# Patient Record
Sex: Female | Born: 1935 | Race: White | Hispanic: No | Smoking: Never smoker
Health system: Southern US, Community
[De-identification: ages and names within clinical notes are randomized; demographics above are authoritative.]

## PROBLEM LIST (undated history)

## (undated) DIAGNOSIS — E669 Obesity, unspecified: Secondary | ICD-10-CM

## (undated) DIAGNOSIS — E119 Type 2 diabetes mellitus without complications: Secondary | ICD-10-CM

## (undated) DIAGNOSIS — M109 Gout, unspecified: Secondary | ICD-10-CM

## (undated) DIAGNOSIS — K219 Gastro-esophageal reflux disease without esophagitis: Secondary | ICD-10-CM

## (undated) DIAGNOSIS — I272 Pulmonary hypertension, unspecified: Secondary | ICD-10-CM

## (undated) DIAGNOSIS — E785 Hyperlipidemia, unspecified: Secondary | ICD-10-CM

## (undated) DIAGNOSIS — T7840XA Allergy, unspecified, initial encounter: Secondary | ICD-10-CM

## (undated) DIAGNOSIS — M1990 Unspecified osteoarthritis, unspecified site: Secondary | ICD-10-CM

## (undated) DIAGNOSIS — E559 Vitamin D deficiency, unspecified: Secondary | ICD-10-CM

## (undated) DIAGNOSIS — N183 Chronic kidney disease, stage 3 unspecified: Secondary | ICD-10-CM

## (undated) DIAGNOSIS — I10 Essential (primary) hypertension: Secondary | ICD-10-CM

## (undated) DIAGNOSIS — I6529 Occlusion and stenosis of unspecified carotid artery: Secondary | ICD-10-CM

## (undated) DIAGNOSIS — F419 Anxiety disorder, unspecified: Secondary | ICD-10-CM

## (undated) DIAGNOSIS — I5032 Chronic diastolic (congestive) heart failure: Secondary | ICD-10-CM

## (undated) DIAGNOSIS — I480 Paroxysmal atrial fibrillation: Secondary | ICD-10-CM

## (undated) HISTORY — DX: Pulmonary hypertension, unspecified: I27.20

## (undated) HISTORY — DX: Allergy, unspecified, initial encounter: T78.40XA

## (undated) HISTORY — DX: Chronic diastolic (congestive) heart failure: I50.32

## (undated) HISTORY — PX: VEIN SURGERY: SHX48

## (undated) HISTORY — DX: Obesity, unspecified: E66.9

## (undated) HISTORY — DX: Occlusion and stenosis of unspecified carotid artery: I65.29

## (undated) HISTORY — DX: Essential (primary) hypertension: I10

## (undated) HISTORY — PX: CATARACT EXTRACTION: SUR2

## (undated) HISTORY — DX: Vitamin D deficiency, unspecified: E55.9

## (undated) HISTORY — PX: OTHER SURGICAL HISTORY: SHX169

## (undated) HISTORY — DX: Paroxysmal atrial fibrillation: I48.0

## (undated) HISTORY — DX: Gout, unspecified: M10.9

## (undated) HISTORY — PX: JOINT REPLACEMENT: SHX530

## (undated) HISTORY — DX: Hyperlipidemia, unspecified: E78.5

## (undated) HISTORY — DX: Unspecified osteoarthritis, unspecified site: M19.90

## (undated) HISTORY — DX: Type 2 diabetes mellitus without complications: E11.9

---

## 2008-02-16 ENCOUNTER — Ambulatory Visit: Payer: Self-pay | Admitting: Vascular Surgery

## 2008-05-31 ENCOUNTER — Ambulatory Visit: Payer: Self-pay | Admitting: Vascular Surgery

## 2008-08-05 ENCOUNTER — Inpatient Hospital Stay (HOSPITAL_COMMUNITY): Admission: RE | Admit: 2008-08-05 | Discharge: 2008-08-09 | Payer: Self-pay | Admitting: Orthopedic Surgery

## 2009-02-10 ENCOUNTER — Ambulatory Visit (HOSPITAL_COMMUNITY): Admission: RE | Admit: 2009-02-10 | Discharge: 2009-02-10 | Payer: Self-pay | Admitting: Internal Medicine

## 2009-03-31 ENCOUNTER — Inpatient Hospital Stay (HOSPITAL_COMMUNITY): Admission: RE | Admit: 2009-03-31 | Discharge: 2009-04-03 | Payer: Self-pay | Admitting: Orthopedic Surgery

## 2010-03-30 ENCOUNTER — Emergency Department (HOSPITAL_COMMUNITY)
Admission: EM | Admit: 2010-03-30 | Discharge: 2010-03-30 | Disposition: A | Discharge status: Home / Self Care | Payer: Medicare Other | Attending: Emergency Medicine | Admitting: Emergency Medicine

## 2010-03-30 DIAGNOSIS — M109 Gout, unspecified: Secondary | ICD-10-CM | POA: Insufficient documentation

## 2010-03-30 DIAGNOSIS — I10 Essential (primary) hypertension: Secondary | ICD-10-CM | POA: Insufficient documentation

## 2010-03-30 DIAGNOSIS — K219 Gastro-esophageal reflux disease without esophagitis: Secondary | ICD-10-CM | POA: Insufficient documentation

## 2010-03-30 DIAGNOSIS — R1110 Vomiting, unspecified: Secondary | ICD-10-CM | POA: Insufficient documentation

## 2010-03-30 DIAGNOSIS — K5289 Other specified noninfective gastroenteritis and colitis: Secondary | ICD-10-CM | POA: Insufficient documentation

## 2010-03-30 DIAGNOSIS — E119 Type 2 diabetes mellitus without complications: Secondary | ICD-10-CM | POA: Insufficient documentation

## 2010-03-30 DIAGNOSIS — R197 Diarrhea, unspecified: Secondary | ICD-10-CM | POA: Insufficient documentation

## 2010-03-30 LAB — URINALYSIS, ROUTINE W REFLEX MICROSCOPIC
Bilirubin Urine: NEGATIVE
Hgb urine dipstick: NEGATIVE
Nitrite: POSITIVE — AB
Specific Gravity, Urine: 1.018 (ref 1.005–1.030)
Urobilinogen, UA: 0.2 mg/dL (ref 0.0–1.0)
pH: 6.5 (ref 5.0–8.0)

## 2010-03-30 LAB — DIFFERENTIAL
Basophils Absolute: 0 10*3/uL (ref 0.0–0.1)
Basophils Relative: 0 % (ref 0–1)
Eosinophils Absolute: 0 10*3/uL (ref 0.0–0.7)
Monocytes Absolute: 0.3 10*3/uL (ref 0.1–1.0)
Monocytes Relative: 5 % (ref 3–12)
Neutrophils Relative %: 91 % — ABNORMAL HIGH (ref 43–77)

## 2010-03-30 LAB — BASIC METABOLIC PANEL
CO2: 24 mEq/L (ref 19–32)
Calcium: 9.1 mg/dL (ref 8.4–10.5)
Chloride: 106 mEq/L (ref 96–112)
Glucose, Bld: 178 mg/dL — ABNORMAL HIGH (ref 70–99)
Potassium: 4.2 mEq/L (ref 3.5–5.1)
Sodium: 138 mEq/L (ref 135–145)

## 2010-03-30 LAB — URINE MICROSCOPIC-ADD ON

## 2010-03-30 LAB — CBC
MCH: 31.6 pg (ref 26.0–34.0)
Platelets: 145 10*3/uL — ABNORMAL LOW (ref 150–400)
RBC: 4.18 MIL/uL (ref 3.87–5.11)
RDW: 13.6 % (ref 11.5–15.5)

## 2010-04-01 LAB — URINE CULTURE
Colony Count: >=100000
Culture  Setup Time: 201202250140

## 2010-04-10 ENCOUNTER — Other Ambulatory Visit (HOSPITAL_COMMUNITY): Payer: Self-pay | Admitting: Internal Medicine

## 2010-04-10 DIAGNOSIS — Z1231 Encounter for screening mammogram for malignant neoplasm of breast: Secondary | ICD-10-CM

## 2010-04-19 ENCOUNTER — Ambulatory Visit (HOSPITAL_COMMUNITY)
Admission: RE | Admit: 2010-04-19 | Discharge: 2010-04-19 | Disposition: A | Discharge status: Home / Self Care | Payer: Medicare Other | Source: Ambulatory Visit | Attending: Internal Medicine | Admitting: Internal Medicine

## 2010-04-19 DIAGNOSIS — Z1231 Encounter for screening mammogram for malignant neoplasm of breast: Secondary | ICD-10-CM | POA: Insufficient documentation

## 2010-04-19 LAB — HM MAMMOGRAPHY: HM Mammogram: NORMAL

## 2010-04-25 LAB — BASIC METABOLIC PANEL
BUN: 45 mg/dL — ABNORMAL HIGH (ref 6–23)
CO2: 25 mEq/L (ref 19–32)
CO2: 27 mEq/L (ref 19–32)
Chloride: 103 mEq/L (ref 96–112)
Creatinine, Ser: 1.76 mg/dL — ABNORMAL HIGH (ref 0.4–1.2)
GFR calc Af Amer: 34 mL/min — ABNORMAL LOW (ref >60)
GFR calc Af Amer: 43 mL/min — ABNORMAL LOW (ref >60)
GFR calc non Af Amer: 28 mL/min — ABNORMAL LOW (ref >60)
GFR calc non Af Amer: 29 mL/min — ABNORMAL LOW (ref >60)
Glucose, Bld: 150 mg/dL — ABNORMAL HIGH (ref 70–99)
Potassium: 4.3 mEq/L (ref 3.5–5.1)
Potassium: 5.4 mEq/L — ABNORMAL HIGH (ref 3.5–5.1)
Sodium: 134 mEq/L — ABNORMAL LOW (ref 135–145)
Sodium: 135 mEq/L (ref 135–145)

## 2010-04-25 LAB — PROTIME-INR
INR: 1.07 (ref 0.00–1.49)
INR: 1.2 (ref 0.00–1.49)
INR: 1.79 — ABNORMAL HIGH (ref 0.00–1.49)
INR: 2.96 — ABNORMAL HIGH (ref 0.00–1.49)
Prothrombin Time: 13.8 seconds (ref 11.6–15.2)
Prothrombin Time: 20.6 seconds — ABNORMAL HIGH (ref 11.6–15.2)

## 2010-04-25 LAB — CBC
HCT: 25.4 % — ABNORMAL LOW (ref 36.0–46.0)
Hemoglobin: 8.1 g/dL — ABNORMAL LOW (ref 12.0–15.0)
Hemoglobin: 8.7 g/dL — ABNORMAL LOW (ref 12.0–15.0)
MCHC: 34.2 g/dL (ref 30.0–36.0)
MCHC: 34.3 g/dL (ref 30.0–36.0)
MCV: 95.4 fL (ref 78.0–100.0)
Platelets: 118 10*3/uL — ABNORMAL LOW (ref 150–400)
RBC: 2.48 MIL/uL — ABNORMAL LOW (ref 3.87–5.11)
RBC: 2.68 MIL/uL — ABNORMAL LOW (ref 3.87–5.11)
RDW: 14.8 % (ref 11.5–15.5)
RDW: 15 % (ref 11.5–15.5)
WBC: 7.6 10*3/uL (ref 4.0–10.5)
WBC: 7.6 10*3/uL (ref 4.0–10.5)

## 2010-04-25 LAB — GLUCOSE, CAPILLARY
Glucose-Capillary: 115 mg/dL — ABNORMAL HIGH (ref 70–99)
Glucose-Capillary: 142 mg/dL — ABNORMAL HIGH (ref 70–99)
Glucose-Capillary: 175 mg/dL — ABNORMAL HIGH (ref 70–99)

## 2010-04-25 LAB — TYPE AND SCREEN

## 2010-05-13 LAB — GLUCOSE, CAPILLARY
Glucose-Capillary: 104 mg/dL — ABNORMAL HIGH (ref 70–99)
Glucose-Capillary: 124 mg/dL — ABNORMAL HIGH (ref 70–99)
Glucose-Capillary: 129 mg/dL — ABNORMAL HIGH (ref 70–99)
Glucose-Capillary: 131 mg/dL — ABNORMAL HIGH (ref 70–99)
Glucose-Capillary: 132 mg/dL — ABNORMAL HIGH (ref 70–99)
Glucose-Capillary: 139 mg/dL — ABNORMAL HIGH (ref 70–99)
Glucose-Capillary: 139 mg/dL — ABNORMAL HIGH (ref 70–99)
Glucose-Capillary: 150 mg/dL — ABNORMAL HIGH (ref 70–99)
Glucose-Capillary: 158 mg/dL — ABNORMAL HIGH (ref 70–99)
Glucose-Capillary: 159 mg/dL — ABNORMAL HIGH (ref 70–99)
Glucose-Capillary: 161 mg/dL — ABNORMAL HIGH (ref 70–99)
Glucose-Capillary: 168 mg/dL — ABNORMAL HIGH (ref 70–99)
Glucose-Capillary: 176 mg/dL — ABNORMAL HIGH (ref 70–99)
Glucose-Capillary: 177 mg/dL — ABNORMAL HIGH (ref 70–99)
Glucose-Capillary: 179 mg/dL — ABNORMAL HIGH (ref 70–99)

## 2010-05-13 LAB — PROTIME-INR
INR: 1.2 (ref 0.00–1.49)
INR: 2.1 — ABNORMAL HIGH (ref 0.00–1.49)
INR: 2.2 — ABNORMAL HIGH (ref 0.00–1.49)
INR: 2.4 — ABNORMAL HIGH (ref 0.00–1.49)
Prothrombin Time: 15.9 seconds — ABNORMAL HIGH (ref 11.6–15.2)

## 2010-05-13 LAB — CBC
HCT: 22.5 % — ABNORMAL LOW (ref 36.0–46.0)
HCT: 23.6 % — ABNORMAL LOW (ref 36.0–46.0)
HCT: 24.4 % — ABNORMAL LOW (ref 36.0–46.0)
HCT: 27.2 % — ABNORMAL LOW (ref 36.0–46.0)
Hemoglobin: 8.1 g/dL — ABNORMAL LOW (ref 12.0–15.0)
Hemoglobin: 8.4 g/dL — ABNORMAL LOW (ref 12.0–15.0)
MCHC: 34.1 g/dL (ref 30.0–36.0)
MCHC: 34.5 g/dL (ref 30.0–36.0)
MCV: 93.9 fL (ref 78.0–100.0)
Platelets: 137 10*3/uL — ABNORMAL LOW (ref 150–400)
RBC: 2.4 MIL/uL — ABNORMAL LOW (ref 3.87–5.11)
RBC: 2.5 MIL/uL — ABNORMAL LOW (ref 3.87–5.11)
RDW: 15.6 % — ABNORMAL HIGH (ref 11.5–15.5)
RDW: 16.1 % — ABNORMAL HIGH (ref 11.5–15.5)
RDW: 16.3 % — ABNORMAL HIGH (ref 11.5–15.5)
WBC: 7.3 10*3/uL (ref 4.0–10.5)
WBC: 7.6 10*3/uL (ref 4.0–10.5)
WBC: 9.5 10*3/uL (ref 4.0–10.5)

## 2010-05-13 LAB — BASIC METABOLIC PANEL
BUN: 33 mg/dL — ABNORMAL HIGH (ref 6–23)
Calcium: 8.5 mg/dL (ref 8.4–10.5)
Calcium: 8.5 mg/dL (ref 8.4–10.5)
Creatinine, Ser: 1.52 mg/dL — ABNORMAL HIGH (ref 0.4–1.2)
GFR calc Af Amer: 42 mL/min — ABNORMAL LOW (ref >60)
GFR calc non Af Amer: 33 mL/min — ABNORMAL LOW (ref >60)
GFR calc non Af Amer: 35 mL/min — ABNORMAL LOW (ref >60)
Glucose, Bld: 139 mg/dL — ABNORMAL HIGH (ref 70–99)
Glucose, Bld: 169 mg/dL — ABNORMAL HIGH (ref 70–99)
Potassium: 4.3 mEq/L (ref 3.5–5.1)
Potassium: 4.6 mEq/L (ref 3.5–5.1)
Sodium: 131 mEq/L — ABNORMAL LOW (ref 135–145)

## 2010-05-14 LAB — TYPE AND SCREEN: ABO/RH(D): O NEG

## 2010-05-14 LAB — URINALYSIS, ROUTINE W REFLEX MICROSCOPIC
Bilirubin Urine: NEGATIVE
Glucose, UA: NEGATIVE mg/dL
Hgb urine dipstick: NEGATIVE
Protein, ur: NEGATIVE mg/dL
Specific Gravity, Urine: 1.014 (ref 1.005–1.030)

## 2010-05-14 LAB — COMPREHENSIVE METABOLIC PANEL
ALT: 18 U/L (ref 0–35)
AST: 26 U/L (ref 0–37)
Alkaline Phosphatase: 100 U/L (ref 39–117)
CO2: 25 mEq/L (ref 19–32)
Calcium: 9.8 mg/dL (ref 8.4–10.5)
Chloride: 106 mEq/L (ref 96–112)
GFR calc Af Amer: 42 mL/min — ABNORMAL LOW (ref >60)
GFR calc non Af Amer: 35 mL/min — ABNORMAL LOW (ref >60)
Glucose, Bld: 105 mg/dL — ABNORMAL HIGH (ref 70–99)
Potassium: 4.6 mEq/L (ref 3.5–5.1)
Sodium: 139 mEq/L (ref 135–145)

## 2010-05-14 LAB — URINE MICROSCOPIC-ADD ON

## 2010-05-14 LAB — DIFFERENTIAL
Basophils Relative: 1 % (ref 0–1)
Eosinophils Absolute: 0.2 10*3/uL (ref 0.0–0.7)
Eosinophils Relative: 2 % (ref 0–5)
Lymphs Abs: 2.1 10*3/uL (ref 0.7–4.0)

## 2010-05-14 LAB — PROTIME-INR: Prothrombin Time: 14 seconds (ref 11.6–15.2)

## 2010-05-14 LAB — CBC
Hemoglobin: 12.6 g/dL (ref 12.0–15.0)
RBC: 3.94 MIL/uL (ref 3.87–5.11)
WBC: 6.5 10*3/uL (ref 4.0–10.5)

## 2010-06-19 NOTE — Consult Note (Signed)
VASCULAR SURGERY CONSULTATION   [name], [name]  DOB:  August 13, 1930                                       02/16/2008  BMWUX#:[id number]   I saw the patient in the office today in consultation concerning her  bilateral lower extremity cellulitis and leg swelling.  This is a  pleasant 77 year old woman who states that she has had cellulitis of  both lower extremities off and on for 2 years.  She has been treated  with doxycycline recently and has had good results from this.  She is  followed by Dr. Concepcion [name].  She has not had any ulcers that she is aware  of.  She has tried compression stockings off and on, however, when the  cellulitis is at its worst she has a hard time tolerating the stockings.  The daughter was also concerned because her feet were turning purple.  She is unaware of any previous history of DVT or phlebitis.  I did not  get any history of claudication, rest pain or nonhealing ulcers.  She  does tell me that she had veins stripped from both legs in 1967.   PAST MEDICAL HISTORY:  Significant for adult onset diabetes and  hypertension.  In addition, she has mild chronic renal insufficiency.  She also has arthritis of both knees and is being considered for knee  replacement by Dr. Luiz [name] once her cellulitis has resolved.  In  addition, she has gastroesophageal reflux disease.  She denies any  history of hypercholesterolemia, history of previous myocardial  infarction, history of congestive heart failure or history of COPD.   FAMILY HISTORY:  Mother died with a stroke at age 75.  Father died from  a heart attack at age 87.  She has a brother with heart disease who died  at age 65.  She has a sister with high blood pressure.   SOCIAL HISTORY:  She is widowed.  She has four children.  She does not  use tobacco.   REVIEW OF SYSTEMS AND MEDICATIONS:  Are documented on the medical  history form in her chart.   PHYSICAL EXAMINATION:  General:  This is a  pleasant 77 year old woman  who appears her stated age.  Vital signs:  Blood pressure is 154/64,  heart rate is 55.  HEENT:  Unremarkable.  Neck:  Supple.  There is no  cervical lymphadenopathy.  I do not detect any carotid bruits.  Lungs:  Clear bilaterally to auscultation.  Cardiac:  She has a regular rate and  rhythm.  Abdomen:  Soft and nontender.  I cannot palpate an aneurysm.  I  do not palpate any masses.  She has normal pitched bowel sounds.  She  has palpable femoral, popliteal and dorsalis pedis pulses bilaterally.  I cannot palpate posterior tibial pulses.  However, both feet are warm  and well-perfused without ischemic ulcers.  She has significant  varicosities in her thighs and around her knees bilaterally.  She has  hyperpigmentation bilaterally.  Currently there are no venous ulcers and  no ischemic ulcers on her feet.  Neurologic:  Exam is nonfocal.   She did have noninvasive studies done by [facility]  which showed normal ABIs bilaterally and no evidence of arterial  insufficiency.  Venous duplex study showed no evidence of thrombus or  thrombophlebitis.  It did not look like  a reflux study was done.   Based on exam she has evidence of chronic venous insufficiency and her  cellulitis appears to be responding nicely to antibiotics.  I have  explained that typically the cellulitis associated with chronic venous  insufficiency can take 6 weeks or longer to treat given the venous  stasis.  We have discussed the importance of intermittent leg elevation  and the proper position for this and also the use of compression  stockings.  She just bought knee high compression stockings with a 20-30  mmHg gradient and she will begin to use these.  I reassured her that she  had excellent arterial flow and that this did not appear to be an issue.  She would like to be seen back in 3 months and at that point we can  arrange for a formal venous reflux study.  At that  point I will have her  follow up with Dr. Hart [name] in case the reflux study suggests that she has  a situation which could potentially be improved with venous ablation.   [name] [name]. [name] [name], M.D.  Electronically Signed  CSD/MEDQ  D:  02/16/2008  T:  02/17/2008  Job:  [id number]   cc:   [name] [name], M.D.  [name] [name], M.D.

## 2010-06-19 NOTE — Op Note (Signed)
[name] [name], [name] [name]                ACCOUNT NO.:  [id number]   MEDICAL RECORD NO.:  [id number]          PATIENT TYPE:  INP   LOCATION:  5010                         FACILITY:  [facility]   PHYSICIAN:  Harvie Junior, M.D.   DATE OF BIRTH:  01/15/1931   DATE OF PROCEDURE:  08/05/2008  DATE OF DISCHARGE:                               OPERATIVE REPORT   PREOPERATIVE DIAGNOSIS:  End-stage degenerative joint disease, left  knee.   POSTOPERATIVE DIAGNOSIS:  End-stage degenerative joint disease, left  knee.   PROCEDURE:  1. Left total knee replacement with Sigma system size 5 femur, size 4      tibia, 10-mm bridging bearing, 38-mm all poly patella.  2. Computer-assisted left total knee replacement.   SURGEON:  Harvie Junior, MD   ASSISTANT:  Marshia Ly, PA   ANESTHESIA:  General.   BRIEF HISTORY:  [name] [name] is a 77 year old female with a long history  of having had severe pain in the both knees for which she presumably was  also treated conservatively for a period of time.  Because of failure of  all conservative care, she was taken to the operating room for a left  total knee replacement because of her significant valgus malalignment.  We felt that computer-assisted total knee replacement is appropriate  course of action.  She was brought to the operating room for this  procedure.   PROCEDURE:  The patient was brought to the operating room.  After  adequate anesthesia was obtained with general anesthetic, the patient  was placed supine on the operating room.  The left leg was prepped and  draped in the sterile fashion.  Following this, the leg was  exsanguinated with blood pressure tourniquet to 350 mmHg.  Following  this, a midline incision was made.  Subcutaneous tissue was dissected  down the level of the extensor mechanism.  A medial parapatellar  arthrotomy was undertaken followed by removal of medial and lateral  meniscus and the anterior and posterior cruciates in the  retropatellar  fat pad.  Once this was taken, the computer assistance modules were  placed, 2 pins in the tibia, 2 pins in the femur, and a registration  process was undertaken, this added half an hour to the surgical  procedure.  Once the registration process was undertaken, the tibia was  cut perpendicular to the long axis care being taken to make a very  minimal cut because of valgus malalignment of the knee.  Attention was  then turned to the distal femur where a 10-mm distal femoral cut was  made perpendicular to the anatomic axis.  The tibial cut was made  perpendicular to the tibia with computer assistance.  Once this was  done, spacer blocks were put in place, a little bit tightest on the  lateral side due to releasing on the lateral side of the IT band  distally in the capsular restraints and the alignment was then perfect  neutral long alignment under computer assistance with good gap balance.  At this point, tension was turned to the femur, sized to a 5.  I checked  that just because of a female, but it did in fact look like 5 in both  medial and lateral and anterior-posterior planes, made the anterior-  posterior cuts and the chamfer cuts and the box cut.  Attention was  turned to the tibia, was sized to a 4, I put it in place, drilled and  keeled the tibia and put a trial tibia and size 5 femur and drilled the  lugs for the femur and then put a 10 bearing in perfect neutral long  alignment with computer assistance and perfect gap balance, turned to  the patellar, cut it down to a level 14, put a 38-mm paddle on and  drilled the lugs for this, put the trial patella in the range of motion,  no tendency towards lateral tracking of the patella, perfectly midline  and excellent range of motion and stability of the knee.  Once this was  completed, attention was turned towards the removal of trial components.  The knee was copiously and thoroughly lavaged, irrigated, suctioned dry,   and dried and then final components were cemented in place, size 4  tibia, size 5 femur, 10-mm bridging bearing trial was placed at 38 all  poly patella with a clamp.  We then removed all excess bone cement.  I  checked the computer alignment one more time, perfect neutral long  alignment and gap balance in both flexion/extension.  The computer was  removed and the cement was allowed to harden.  At this point once the  cement was hardened, the tourniquet was let down, all excess bone cement  was removed with tools, and bleeding was controlled with electrocautery.  The knee was copiously and thoroughly lavaged and the final 10 poly was  put in place.  Medium Hemovac drain and the medial parapatellar  arthrotomy was closed with 1 Vicryl running, skin with 0 and 2-0 Vicryl  and skin staples.  Sterile compressive dressing was applied as well as a  knee immobilizer.  The patient was taken to the recovery room and noted  to be in satisfactory condition.  Estimated blood loss for the procedure  was less than 100 mL.      Harvie Junior, M.D.  Electronically Signed     JLG/MEDQ  D:  08/05/2008  T:  08/06/2008  Job:  [id number]

## 2010-06-19 NOTE — Discharge Summary (Signed)
NAMEAZARRIA, BALINT                ACCOUNT NO.:  [id number]   MEDICAL RECORD NO.:  [id number]          PATIENT TYPE:  INP   LOCATION:  5010                         FACILITY:  [facility]   PHYSICIAN:  Harvie Junior, M.D.   DATE OF BIRTH:  Dec 25, 1930   DATE OF ADMISSION:  08/05/2008  DATE OF DISCHARGE:  08/09/2008                               DISCHARGE SUMMARY   ADMITTING DIAGNOSES:  1. End-stage degenerative joint disease, left knee.  2. Hypertension.  3. Diet-controlled diabetes mellitus.  4. Gouty arthritis.  5. Chronic renal insufficiency.   DISCHARGE DIAGNOSES:  1. End-stage degenerative joint disease, left knee.  2. Hypertension.  3. Diet-controlled diabetes mellitus.  4. Gouty arthritis.  5. Chronic renal insufficiency.  6. Acute blood loss anemia.   PROCEDURES IN HOSPITAL:  Left total knee arthroplasty, computer-  assisted, Jodi Geralds' disease, M.D., August 05, 2008.   BRIEF HISTORY:  Ms. Folden is a pleasant 77 year old female who recently  moved here from [place], [place].  She lives at [facility]  on [facility], which is an assisted living facility.  She  complained of pain in her left knee associated with weightbearing and  also pain at rest.  Her weightbearing x-rays of the left knee showed  that she has bone-on-bone degenerative arthritis.  She got only  temporary relief with conservative treatment including injection  therapy, modification of her activity and medication.  Based upon her  clinical and radiographic findings, she was felt to be a candidate for a  left total knee replacement and is admitted for this.   PERTINENT LABORATORY STUDIES:  Chest x-ray on admission on August 02, 2008  showed no acute cardiopulmonary disease.  EKG on admission showed sinus  bradycardia with premature atrial complexes, left axis deviation,  moderate voltage criteria for LVH, may be normal variant.  Her  hemoglobin on admission was 12.6.  On postoperative day #1,  hemoglobin  was 9.2 with hematocrit of 27.2.  On postoperative day #2, her  hemoglobin was 8.4.  On postoperative day #3, August 08, 2008, it was 8.1  with hematocrit of 23.6.  A stat CBC was ordered on the morning of the  discharge.  Her BMET on admission was within normal limits other than  elevated BUN at 40 and creatinine of 1.45.  This remained stable through  the hospitalization, and on August 07, 2008, her BUN was 31 with a  creatinine of 1.45.  Her blood sugars ranged from 105 to 139.  Her pro  time on admission was 14.0 seconds with an INR of 1.1 and a PTT of 34.  On the day of discharge on Coumadin therapy, her pro time was 24.7  seconds with an INR of 2.1.  CBGs were followed through the  hospitalization and ranged from 104 to 151 on the morning of discharge,  August 09, 2008.   HOSPITAL COURSE:  The patient underwent a left total knee arthroplasty,  computer-assisted, that is well described in Dr. Luiz Blare' operative note  on August 05, 2008.  Preoperatively, she was given a gram of Ancef and  gentamicin 80 mg IV.  Postoperatively, she was given a gram of Ancef  q.8h. x24 hours.  PCA morphine pump was used for pain control, and IV  fluids were instituted.  A Foley catheter was placed at the time of  surgery.  Physical therapy was ordered for walker ambulation and  weightbearing as tolerated on the left.  On postoperative day #1, she  without complaints.  She was taking fluids without difficulty.  Her  vital signs were stable and she was afebrile.  Her hemoglobin was 9.2.  Her BMET showed an elevated BUN and creatinine, but this was stable  compared with preop.  IV fluids were continued.  Her INR was 1.2.  She  was gotten out of bed with physical therapy, and Coumadin was started  for DVT prophylaxis.  On postoperative day #2, she had no complaints.  She did have some nausea and vomiting after taking oral iron, so the  iron was discontinued.  Her Foley catheter was intact.  Her vital  signs  were stable.  Her O2 saturations were 100% on 2 liters oxygen.  She did  desaturate down to the 70s occasionally at night without oxygen, so she  did fine with the oxygen.  She does have a significant family history of  sleep apnea apparently.  Her hemoglobin on this study was 8.4, INR was  2.2.  Her dressing was changed and her Hemovac drain was pulled.  Her IV  was converted to a saline lock.  Her Foley catheter was discontinued, as  well.  Her O2 saturations occasionally would drop to 79% while asleep,  and she was awaken herself and bring her O2 saturations back up into the  mid 90s.  Oxygen by nasal cannula was instituted.  On postoperative day  #3, her hemoglobin was 8.1, INR was 2.4.  The patient denied any  dizziness or shortness of breath with getting up.  On the morning of  discharge, August 09, 2008, she was feeling okay.  We ordered a stat CBC.  She had no dizziness or other complaints.  She was taking fluids and  voiding without difficulty and progressing with physical therapy.  Her  left knee dressing was clean and dry.  Her calf was soft and nontender.  Her INR was 2.1.  She was discharged to a skilled nursing facility.  We  will check CBC prior to discharge.   CONDITION ON DISCHARGE:  Improved.   DISCHARGE DIET:  Regular.   ACTIVITY:  Activity status will be weightbearing as tolerated on the  left with a walker.  She will need daily physical therapy for aggressive  range of motion of the left knee and walker ambulation, weightbearing as  tolerated on the left.  She will need oxygen by nasal cannula in the  evenings while sleeping until she can be evaluated by her medical  doctor, Dr. Oneta Rack.   MEDICATIONS AT DISCHARGE:  1. Colace 100 mg b.i.d.  2. Coumadin 2.5 mg every other day and Coumadin 1 mg on alternating      days.  3. Cozaar 100 mg daily.  4. Lasix 20 mg daily.  5. Protonix 40 mg daily.  6. Tenormin 100 mg daily.  7. Xanax 0.25 mg p.o. q.h.s.  p.r.n.  8. Zyloprim 300 mg daily.  9. Dulcolax 10 mg suppository daily p.r.n. constipation.  10.Vicodin 5 mg one to two q.4h. p.r.n. pain.  11.Tylenol 325 mg one or two q.6h. p.r.n. temperature greater than  101; also can be used for mild pain.  12.Trinsicon iron tablet one after her evening meal with Phenergan      12.5 mg tablet with the iron after the evening meal.   She will need Coumadin management per pharmacy with pro times per  pharmacy, shooting for an INR of 2.0.  She will need her left knee  dressing changed every 3 days, and a CPM machine set at 0 degrees to 70  degrees used 8  hours per 24 hours.  She will need a follow up with Dr. Luiz Blare in the  office on August 22, 2008.  Our office number is [phone].  If there are  any medical issues, Dr. Oneta Rack can be consulted or the physician on  staff at the skilled nursing facility.      Marshia Ly, P.A.      Harvie Junior, M.D.  Electronically Signed    JB/MEDQ  D:  08/09/2008  T:  08/09/2008  Job:  [id number]   cc:   Lucky Cowboy, M.D.

## 2010-06-19 NOTE — Assessment & Plan Note (Signed)
OFFICE VISIT   [name] [name], [name] [name]  DOB:  [birth year]                                       05/31/2008  EAVWU#:[id number]   I saw the patient in the office today for continued followup of her  chronic venous insufficiency and bilateral lower extremity swelling.  This is a pleasant 77 year old woman who I had seen in consultation in  January of this year with cellulitis of both lower extremities which she  had had off and on for 2 years.  She had been treated with doxycycline  at that time.  She had previous noninvasive studies in [facility] which showed normal ABIs and no evidence of arterial  insufficiency.  I felt that she had chronic venous insufficiency and  that her cellulitis appeared to be responding to the antibiotics.  We  recommended intermittent leg elevation and wrote her a prescription for  compression stockings with a 20-30 mmHg pressure gradient.  She returns  for a 3 month followup visit.   Since I saw her last she states that her legs have improved  significantly.  The cellulitis completely resolved.  She has been  elevating her legs with her Lounge Doctor Leg Rest and has been wearing  her knee high compression stockings.  She has had no recurrent episodes  of cellulitis and no significant leg pain.   REVIEW OF SYSTEMS:  She has had no recent chest pain, chest pressure,  palpitations or arrhythmias.  She has had no productive cough,  bronchitis, asthma or wheezing.   PHYSICAL EXAMINATION:  This is a pleasant 77 year old woman who appears  her stated age.  Her blood pressure is 165/80, heart rate is 60.  I do  not detect any carotid bruits.  Lungs are clear bilaterally to  auscultation.  On cardiac exam she has a regular rate and rhythm.  She  has palpable femoral, popliteal and dorsalis pedis pulses bilaterally.  She has mild bilateral lower extremity swelling.  She has some  hyperpigmentation on the right but no  cellulitis.   Doppler study in our office today shows no evidence of deep venous  thrombosis or superficial thrombophlebitis.  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  She has had both of  her saphenous veins previously stripped.   Overall her symptoms in her legs have improved significantly with  intermittent leg elevation and compression stockings.  She understands  that this will be a chronic problem and she will continue to use these  modalities.  With respect to considering knee replacement I did feel  that she would certainly be at increased risk for developing increasing  leg swelling after surgery but I think this is something that could be  easily managed.  I do think she has adequate arterial flow for any  potential surgery.  I will see her back p.r.n.   Di Kindle. Edilia Bo, M.D.  Electronically Signed   CSD/MEDQ  D:  05/31/2008  T:  06/01/2008  Job:  [id number]   cc:   Harvie Junior, M.D.  Fleet Contras, M.D.

## 2010-06-19 NOTE — Procedures (Signed)
DUPLEX DEEP VENOUS EXAM - LOWER EXTREMITY   INDICATION:  Bilateral lower extremity swelling and pain.   HISTORY:  Edema:  No.  Trauma/Surgery:  No.  Pain:  Yes.  PE:  No.  Previous DVT:  No.  Anticoagulants:  No.  Other:   DUPLEX EXAM:                CFV   SFV   PopV  PTV    GSV                R  L  R  L  R  L  R   L  R  L  Thrombosis    o  o  o  o  o  o  o   o  Spontaneous   +  +  +  +  +  +  +   +  Phasic        +  +  +  +  +  +  +   +  Augmentation  +  +  +  +  +  +  +   +  Compressible  +  +  +  +  +  +  +   +  Competent     +  +  +  +  +  +  +   +   Legend:  + - yes  o - no  p - partial  D - decreased   IMPRESSION:  1. No evidence of deep or superficial vein thrombosis.  2. Reflux noted in the lateral branch of the right greater saphenous      vein.  3. Incompetent perforators noted at the right distal calf.    _____________________________  Di Kindle. Edilia Bo, M.D.   AC/MEDQ  D:  05/31/2008  T:  05/31/2008  Job:  [id number]

## 2011-02-11 DIAGNOSIS — N8110 Cystocele, unspecified: Secondary | ICD-10-CM | POA: Diagnosis not present

## 2011-02-11 DIAGNOSIS — Z87440 Personal history of urinary (tract) infections: Secondary | ICD-10-CM | POA: Diagnosis not present

## 2011-02-11 DIAGNOSIS — N390 Urinary tract infection, site not specified: Secondary | ICD-10-CM | POA: Diagnosis not present

## 2011-02-11 DIAGNOSIS — N3941 Urge incontinence: Secondary | ICD-10-CM | POA: Diagnosis not present

## 2011-04-08 DIAGNOSIS — E538 Deficiency of other specified B group vitamins: Secondary | ICD-10-CM | POA: Diagnosis not present

## 2011-04-08 DIAGNOSIS — E782 Mixed hyperlipidemia: Secondary | ICD-10-CM | POA: Diagnosis not present

## 2011-04-08 DIAGNOSIS — I10 Essential (primary) hypertension: Secondary | ICD-10-CM | POA: Diagnosis not present

## 2011-04-08 DIAGNOSIS — E559 Vitamin D deficiency, unspecified: Secondary | ICD-10-CM | POA: Diagnosis not present

## 2011-04-08 DIAGNOSIS — E119 Type 2 diabetes mellitus without complications: Secondary | ICD-10-CM | POA: Diagnosis not present

## 2011-04-08 DIAGNOSIS — M109 Gout, unspecified: Secondary | ICD-10-CM | POA: Diagnosis not present

## 2011-04-08 DIAGNOSIS — Z79899 Other long term (current) drug therapy: Secondary | ICD-10-CM | POA: Diagnosis not present

## 2011-07-15 DIAGNOSIS — E782 Mixed hyperlipidemia: Secondary | ICD-10-CM | POA: Diagnosis not present

## 2011-07-15 DIAGNOSIS — N3000 Acute cystitis without hematuria: Secondary | ICD-10-CM | POA: Diagnosis not present

## 2011-07-15 DIAGNOSIS — E559 Vitamin D deficiency, unspecified: Secondary | ICD-10-CM | POA: Diagnosis not present

## 2011-07-15 DIAGNOSIS — E119 Type 2 diabetes mellitus without complications: Secondary | ICD-10-CM | POA: Diagnosis not present

## 2011-07-15 DIAGNOSIS — Z79899 Other long term (current) drug therapy: Secondary | ICD-10-CM | POA: Diagnosis not present

## 2011-07-15 DIAGNOSIS — E538 Deficiency of other specified B group vitamins: Secondary | ICD-10-CM | POA: Diagnosis not present

## 2011-07-15 DIAGNOSIS — I10 Essential (primary) hypertension: Secondary | ICD-10-CM | POA: Diagnosis not present

## 2011-08-19 DIAGNOSIS — D2110 Benign neoplasm of connective and other soft tissue of unspecified upper limb, including shoulder: Secondary | ICD-10-CM | POA: Diagnosis not present

## 2011-08-19 DIAGNOSIS — D485 Neoplasm of uncertain behavior of skin: Secondary | ICD-10-CM | POA: Diagnosis not present

## 2011-08-19 DIAGNOSIS — N3000 Acute cystitis without hematuria: Secondary | ICD-10-CM | POA: Diagnosis not present

## 2011-10-17 DIAGNOSIS — N390 Urinary tract infection, site not specified: Secondary | ICD-10-CM | POA: Diagnosis not present

## 2011-10-17 DIAGNOSIS — R7309 Other abnormal glucose: Secondary | ICD-10-CM | POA: Diagnosis not present

## 2011-10-17 DIAGNOSIS — R0602 Shortness of breath: Secondary | ICD-10-CM | POA: Diagnosis not present

## 2011-10-17 DIAGNOSIS — E538 Deficiency of other specified B group vitamins: Secondary | ICD-10-CM | POA: Diagnosis not present

## 2011-10-17 DIAGNOSIS — E559 Vitamin D deficiency, unspecified: Secondary | ICD-10-CM | POA: Diagnosis not present

## 2011-10-17 DIAGNOSIS — I10 Essential (primary) hypertension: Secondary | ICD-10-CM | POA: Diagnosis not present

## 2011-10-17 DIAGNOSIS — D649 Anemia, unspecified: Secondary | ICD-10-CM | POA: Diagnosis not present

## 2011-10-17 DIAGNOSIS — E781 Pure hyperglyceridemia: Secondary | ICD-10-CM | POA: Diagnosis not present

## 2011-11-04 DIAGNOSIS — M549 Dorsalgia, unspecified: Secondary | ICD-10-CM | POA: Diagnosis not present

## 2011-11-04 DIAGNOSIS — IMO0002 Reserved for concepts with insufficient information to code with codable children: Secondary | ICD-10-CM | POA: Diagnosis not present

## 2011-11-08 DIAGNOSIS — J042 Acute laryngotracheitis: Secondary | ICD-10-CM | POA: Diagnosis not present

## 2011-11-13 DIAGNOSIS — Z23 Encounter for immunization: Secondary | ICD-10-CM | POA: Diagnosis not present

## 2011-11-18 DIAGNOSIS — E119 Type 2 diabetes mellitus without complications: Secondary | ICD-10-CM | POA: Diagnosis not present

## 2011-11-19 DIAGNOSIS — Z79899 Other long term (current) drug therapy: Secondary | ICD-10-CM | POA: Diagnosis not present

## 2011-11-19 DIAGNOSIS — I10 Essential (primary) hypertension: Secondary | ICD-10-CM | POA: Diagnosis not present

## 2011-11-19 DIAGNOSIS — J45909 Unspecified asthma, uncomplicated: Secondary | ICD-10-CM | POA: Diagnosis not present

## 2011-11-19 DIAGNOSIS — R7309 Other abnormal glucose: Secondary | ICD-10-CM | POA: Diagnosis not present

## 2011-11-22 DIAGNOSIS — M545 Low back pain: Secondary | ICD-10-CM | POA: Diagnosis not present

## 2011-11-27 DIAGNOSIS — M545 Low back pain: Secondary | ICD-10-CM | POA: Diagnosis not present

## 2011-11-27 DIAGNOSIS — IMO0002 Reserved for concepts with insufficient information to code with codable children: Secondary | ICD-10-CM | POA: Diagnosis not present

## 2011-12-03 DIAGNOSIS — M545 Low back pain: Secondary | ICD-10-CM | POA: Diagnosis not present

## 2011-12-03 DIAGNOSIS — IMO0002 Reserved for concepts with insufficient information to code with codable children: Secondary | ICD-10-CM | POA: Diagnosis not present

## 2011-12-04 DIAGNOSIS — R5383 Other fatigue: Secondary | ICD-10-CM | POA: Diagnosis not present

## 2011-12-04 DIAGNOSIS — I10 Essential (primary) hypertension: Secondary | ICD-10-CM | POA: Diagnosis not present

## 2011-12-04 DIAGNOSIS — R5381 Other malaise: Secondary | ICD-10-CM | POA: Diagnosis not present

## 2011-12-05 DIAGNOSIS — IMO0002 Reserved for concepts with insufficient information to code with codable children: Secondary | ICD-10-CM | POA: Diagnosis not present

## 2011-12-05 DIAGNOSIS — Z23 Encounter for immunization: Secondary | ICD-10-CM | POA: Diagnosis not present

## 2011-12-05 DIAGNOSIS — M545 Low back pain: Secondary | ICD-10-CM | POA: Diagnosis not present

## 2011-12-09 ENCOUNTER — Other Ambulatory Visit (HOSPITAL_COMMUNITY): Payer: Self-pay | Admitting: Internal Medicine

## 2011-12-09 DIAGNOSIS — L02419 Cutaneous abscess of limb, unspecified: Secondary | ICD-10-CM | POA: Diagnosis not present

## 2011-12-09 DIAGNOSIS — L03119 Cellulitis of unspecified part of limb: Secondary | ICD-10-CM | POA: Diagnosis not present

## 2011-12-09 DIAGNOSIS — E038 Other specified hypothyroidism: Secondary | ICD-10-CM

## 2011-12-10 DIAGNOSIS — M545 Low back pain: Secondary | ICD-10-CM | POA: Diagnosis not present

## 2011-12-12 DIAGNOSIS — IMO0002 Reserved for concepts with insufficient information to code with codable children: Secondary | ICD-10-CM | POA: Diagnosis not present

## 2011-12-12 DIAGNOSIS — M545 Low back pain: Secondary | ICD-10-CM | POA: Diagnosis not present

## 2011-12-17 DIAGNOSIS — IMO0002 Reserved for concepts with insufficient information to code with codable children: Secondary | ICD-10-CM | POA: Diagnosis not present

## 2011-12-17 DIAGNOSIS — M545 Low back pain: Secondary | ICD-10-CM | POA: Diagnosis not present

## 2011-12-19 DIAGNOSIS — IMO0002 Reserved for concepts with insufficient information to code with codable children: Secondary | ICD-10-CM | POA: Diagnosis not present

## 2011-12-19 DIAGNOSIS — M545 Low back pain: Secondary | ICD-10-CM | POA: Diagnosis not present

## 2011-12-24 ENCOUNTER — Encounter (HOSPITAL_COMMUNITY)
Admission: RE | Admit: 2011-12-24 | Discharge: 2011-12-24 | Disposition: A | Discharge status: Home / Self Care | Payer: Medicare Other | Source: Ambulatory Visit | Attending: Internal Medicine | Admitting: Internal Medicine

## 2011-12-24 DIAGNOSIS — E349 Endocrine disorder, unspecified: Secondary | ICD-10-CM | POA: Diagnosis not present

## 2011-12-24 DIAGNOSIS — E038 Other specified hypothyroidism: Secondary | ICD-10-CM | POA: Insufficient documentation

## 2011-12-25 ENCOUNTER — Encounter (HOSPITAL_COMMUNITY)
Admission: RE | Admit: 2011-12-25 | Discharge: 2011-12-25 | Disposition: A | Discharge status: Home / Self Care | Payer: Medicare Other | Source: Ambulatory Visit | Attending: Internal Medicine | Admitting: Internal Medicine

## 2011-12-25 DIAGNOSIS — E0789 Other specified disorders of thyroid: Secondary | ICD-10-CM | POA: Diagnosis not present

## 2011-12-25 DIAGNOSIS — E049 Nontoxic goiter, unspecified: Secondary | ICD-10-CM | POA: Insufficient documentation

## 2011-12-25 MED ORDER — SODIUM IODIDE I 131 CAPSULE
10.0000 | Freq: Once | INTRAVENOUS | Status: AC | PRN
  Administered 2011-12-24: 10 via ORAL

## 2011-12-25 MED ORDER — SODIUM PERTECHNETATE TC 99M INJECTION
10.5000 | Freq: Once | INTRAVENOUS | Status: AC | PRN
  Administered 2011-12-25: 10.5 via INTRAVENOUS

## 2011-12-27 DIAGNOSIS — M545 Low back pain: Secondary | ICD-10-CM | POA: Diagnosis not present

## 2011-12-27 DIAGNOSIS — IMO0002 Reserved for concepts with insufficient information to code with codable children: Secondary | ICD-10-CM | POA: Diagnosis not present

## 2012-01-06 DIAGNOSIS — L03119 Cellulitis of unspecified part of limb: Secondary | ICD-10-CM | POA: Diagnosis not present

## 2012-01-06 DIAGNOSIS — L02419 Cutaneous abscess of limb, unspecified: Secondary | ICD-10-CM | POA: Diagnosis not present

## 2012-01-06 DIAGNOSIS — E060 Acute thyroiditis: Secondary | ICD-10-CM | POA: Diagnosis not present

## 2012-01-07 DIAGNOSIS — M549 Dorsalgia, unspecified: Secondary | ICD-10-CM | POA: Diagnosis not present

## 2012-01-07 DIAGNOSIS — M25559 Pain in unspecified hip: Secondary | ICD-10-CM | POA: Diagnosis not present

## 2012-01-07 DIAGNOSIS — M545 Low back pain: Secondary | ICD-10-CM | POA: Diagnosis not present

## 2012-02-03 DIAGNOSIS — R7309 Other abnormal glucose: Secondary | ICD-10-CM | POA: Diagnosis not present

## 2012-02-03 DIAGNOSIS — Z79899 Other long term (current) drug therapy: Secondary | ICD-10-CM | POA: Diagnosis not present

## 2012-02-03 DIAGNOSIS — N3000 Acute cystitis without hematuria: Secondary | ICD-10-CM | POA: Diagnosis not present

## 2012-02-03 DIAGNOSIS — Z1212 Encounter for screening for malignant neoplasm of rectum: Secondary | ICD-10-CM | POA: Diagnosis not present

## 2012-02-03 DIAGNOSIS — E559 Vitamin D deficiency, unspecified: Secondary | ICD-10-CM | POA: Diagnosis not present

## 2012-02-03 DIAGNOSIS — E782 Mixed hyperlipidemia: Secondary | ICD-10-CM | POA: Diagnosis not present

## 2012-02-03 DIAGNOSIS — I10 Essential (primary) hypertension: Secondary | ICD-10-CM | POA: Diagnosis not present

## 2012-03-12 DIAGNOSIS — R7309 Other abnormal glucose: Secondary | ICD-10-CM | POA: Diagnosis not present

## 2012-03-12 DIAGNOSIS — N3000 Acute cystitis without hematuria: Secondary | ICD-10-CM | POA: Diagnosis not present

## 2012-05-04 DIAGNOSIS — E782 Mixed hyperlipidemia: Secondary | ICD-10-CM | POA: Diagnosis not present

## 2012-05-04 DIAGNOSIS — N3000 Acute cystitis without hematuria: Secondary | ICD-10-CM | POA: Diagnosis not present

## 2012-05-04 DIAGNOSIS — I10 Essential (primary) hypertension: Secondary | ICD-10-CM | POA: Diagnosis not present

## 2012-05-04 DIAGNOSIS — R7309 Other abnormal glucose: Secondary | ICD-10-CM | POA: Diagnosis not present

## 2012-05-04 DIAGNOSIS — Z79899 Other long term (current) drug therapy: Secondary | ICD-10-CM | POA: Diagnosis not present

## 2012-05-04 DIAGNOSIS — E559 Vitamin D deficiency, unspecified: Secondary | ICD-10-CM | POA: Diagnosis not present

## 2012-05-09 ENCOUNTER — Emergency Department (HOSPITAL_COMMUNITY): Payer: Medicare Other

## 2012-05-09 ENCOUNTER — Emergency Department (HOSPITAL_COMMUNITY)
Admission: EM | Admit: 2012-05-09 | Discharge: 2012-05-09 | Disposition: A | Discharge status: Home / Self Care | Payer: Medicare Other | Attending: Emergency Medicine | Admitting: Emergency Medicine

## 2012-05-09 ENCOUNTER — Other Ambulatory Visit: Payer: Self-pay

## 2012-05-09 DIAGNOSIS — N289 Disorder of kidney and ureter, unspecified: Secondary | ICD-10-CM | POA: Diagnosis not present

## 2012-05-09 DIAGNOSIS — R5381 Other malaise: Secondary | ICD-10-CM | POA: Insufficient documentation

## 2012-05-09 DIAGNOSIS — R350 Frequency of micturition: Secondary | ICD-10-CM | POA: Insufficient documentation

## 2012-05-09 DIAGNOSIS — R5383 Other fatigue: Secondary | ICD-10-CM | POA: Insufficient documentation

## 2012-05-09 DIAGNOSIS — Z79899 Other long term (current) drug therapy: Secondary | ICD-10-CM | POA: Diagnosis not present

## 2012-05-09 DIAGNOSIS — R509 Fever, unspecified: Secondary | ICD-10-CM | POA: Insufficient documentation

## 2012-05-09 DIAGNOSIS — Z7982 Long term (current) use of aspirin: Secondary | ICD-10-CM | POA: Diagnosis not present

## 2012-05-09 DIAGNOSIS — R05 Cough: Secondary | ICD-10-CM

## 2012-05-09 DIAGNOSIS — R059 Cough, unspecified: Secondary | ICD-10-CM | POA: Insufficient documentation

## 2012-05-09 DIAGNOSIS — IMO0002 Reserved for concepts with insufficient information to code with codable children: Secondary | ICD-10-CM | POA: Insufficient documentation

## 2012-05-09 DIAGNOSIS — R079 Chest pain, unspecified: Secondary | ICD-10-CM | POA: Diagnosis not present

## 2012-05-09 DIAGNOSIS — R0602 Shortness of breath: Secondary | ICD-10-CM | POA: Diagnosis not present

## 2012-05-09 LAB — CBC WITH DIFFERENTIAL/PLATELET
HCT: 34.1 % — ABNORMAL LOW (ref 36.0–46.0)
Hemoglobin: 11.4 g/dL — ABNORMAL LOW (ref 12.0–15.0)
Lymphocytes Relative: 22 % (ref 12–46)
MCHC: 33.4 g/dL (ref 30.0–36.0)
MCV: 93.7 fL (ref 78.0–100.0)
Monocytes Absolute: 0.7 10*3/uL (ref 0.1–1.0)
Monocytes Relative: 13 % — ABNORMAL HIGH (ref 3–12)
Neutro Abs: 3.1 10*3/uL (ref 1.7–7.7)
WBC: 5 10*3/uL (ref 4.0–10.5)

## 2012-05-09 LAB — COMPREHENSIVE METABOLIC PANEL
BUN: 75 mg/dL — ABNORMAL HIGH (ref 6–23)
CO2: 22 mEq/L (ref 19–32)
Chloride: 101 mEq/L (ref 96–112)
Creatinine, Ser: 1.84 mg/dL — ABNORMAL HIGH (ref 0.50–1.10)
GFR calc Af Amer: 29 mL/min — ABNORMAL LOW (ref >90)
GFR calc non Af Amer: 25 mL/min — ABNORMAL LOW (ref >90)
Total Bilirubin: 0.2 mg/dL — ABNORMAL LOW (ref 0.3–1.2)

## 2012-05-09 LAB — URINALYSIS, ROUTINE W REFLEX MICROSCOPIC
Hgb urine dipstick: NEGATIVE
Protein, ur: NEGATIVE mg/dL
Urobilinogen, UA: 0.2 mg/dL (ref 0.0–1.0)

## 2012-05-09 LAB — CG4 I-STAT (LACTIC ACID): Lactic Acid, Venous: 1.26 mmol/L (ref 0.5–2.2)

## 2012-05-09 LAB — URINE MICROSCOPIC-ADD ON

## 2012-05-09 MED ORDER — PREDNISONE 20 MG PO TABS
60.0000 mg | ORAL_TABLET | ORAL | Status: AC
  Administered 2012-05-09: 60 mg via ORAL
  Filled 2012-05-09: qty 3

## 2012-05-09 MED ORDER — PREDNISONE 20 MG PO TABS: 60.0000 mg | ORAL_TABLET | Freq: Every day | ORAL | Status: AC

## 2012-05-09 MED ORDER — BENZONATATE 100 MG PO CAPS: 100.0000 mg | ORAL_CAPSULE | Freq: Three times a day (TID) | ORAL | Status: DC | PRN

## 2012-05-09 MED ORDER — SULFAMETHOXAZOLE-TMP DS 800-160 MG PO TABS: 1.0000 | ORAL_TABLET | Freq: Two times a day (BID) | ORAL | Status: AC

## 2012-05-09 MED ORDER — SODIUM CHLORIDE 0.9 % IV BOLUS (SEPSIS)
1000.0000 mL | Freq: Once | INTRAVENOUS | Status: AC
  Administered 2012-05-09: 1000 mL via INTRAVENOUS

## 2012-05-09 MED ORDER — SULFAMETHOXAZOLE-TMP DS 800-160 MG PO TABS
1.0000 | ORAL_TABLET | Freq: Once | ORAL | Status: AC
  Administered 2012-05-09: 1 via ORAL
  Filled 2012-05-09: qty 1

## 2012-05-09 NOTE — ED Notes (Signed)
Patient with low grade fever (<100F), cough, malaise, and general lethargy. Family at bedside. Patient reports pain in chest when coughing, which radiates up towards the back of her ears.

## 2012-05-09 NOTE — ED Notes (Addendum)
CG4 I-Stat shown to Dr. Jeraldine Loots.  He took Counsellor paper.  Values were within normal limits.

## 2012-05-10 NOTE — ED Provider Notes (Signed)
History     CSN: [id number]  Arrival date & time 05/09/12  1478   First MD Initiated Contact with Patient 05/09/12 2001      Chief Complaint  Patient presents with  . Cough  . Fever    (Consider location/radiation/quality/duration/timing/severity/associated sxs/prior treatment) HPI Patient presents with concern of cough, fatigue.  This cough began several days ago, without clear precipitant.  Since onset has been persistent cough and fatigue.  No fever greater than 100, no significant dyspnea, no abdominal pain, nausea, vomiting, diarrhea. No relief with Mucinex.  There is concurrent congestion. No headache, no sore addition, no falls, no weakness. The patient states that she has been compliant with all medications, with no new dosages.  History of present illness is per the patient and her daughter.  No past medical history on file.  No past surgical history on file.  No family history on file.  History  Substance Use Topics  . Smoking status: Not on file  . Smokeless tobacco: Not on file  . Alcohol Use: Not on file    OB History   No data available      Review of Systems  Constitutional:       Per HPI, otherwise negative  HENT:       Per HPI, otherwise negative  Respiratory:       Per HPI, otherwise negative  Cardiovascular:       Per HPI, otherwise negative  Gastrointestinal: Negative for vomiting.  Endocrine:       Negative aside from HPI  Genitourinary: Positive for frequency.  Musculoskeletal:       Per HPI, otherwise negative  Skin: Negative.   Neurological: Negative for syncope.    Allergies  Codeine and Tylenol pm extra  Home Medications   Current Outpatient Rx  Name  Route  Sig  Dispense  Refill  . acidophilus (RISAQUAD) CAPS   Oral   Take 1 capsule by mouth every morning.         Marland Kitchen allopurinol (ZYLOPRIM) 300 MG tablet   Oral   Take 300 mg by mouth every evening.         Marland Kitchen ALPRAZolam (XANAX) 0.25 MG tablet   Oral   Take 0.25  mg by mouth at bedtime as needed for sleep.         Marland Kitchen aspirin EC 81 MG tablet   Oral   Take 81 mg by mouth every evening.         Marland Kitchen atenolol (TENORMIN) 100 MG tablet   Oral   Take 100 mg by mouth every evening.         . cholecalciferol (VITAMIN D) 1000 UNITS tablet   Oral   Take 2,000 Units by mouth every morning.         Marland Kitchen doxazosin (CARDURA) 8 MG tablet   Oral   Take 8 mg by mouth at bedtime.         . Flaxseed, Linseed, (FLAX SEED OIL) 1000 MG CAPS   Oral   Take 1 capsule by mouth every morning.         . furosemide (LASIX) 40 MG tablet   Oral   Take 20 mg by mouth every morning.         . gabapentin (NEURONTIN) 300 MG capsule   Oral   Take 300 mg by mouth at bedtime.         Marland Kitchen gemfibrozil (LOPID) 600 MG tablet   Oral   Take  600 mg by mouth 2 (two) times daily.         Marland Kitchen guaiFENesin (MUCINEX) 600 MG 12 hr tablet   Oral   Take 600 mg by mouth 3 (three) times daily.         Marland Kitchen loratadine (CLARITIN) 10 MG tablet   Oral   Take 10 mg by mouth every morning.         Marland Kitchen losartan (COZAAR) 100 MG tablet   Oral   Take 100 mg by mouth every morning.         Marland Kitchen omeprazole (PRILOSEC) 40 MG capsule   Oral   Take 40 mg by mouth every morning.         . vitamin B-12 (CYANOCOBALAMIN) 1000 MCG tablet   Oral   Take 1,000 mcg by mouth every morning.         . vitamin C (ASCORBIC ACID) 500 MG tablet   Oral   Take 500 mg by mouth 3 (three) times daily.         . benzonatate (TESSALON PERLES) 100 MG capsule   Oral   Take 1 capsule (100 mg total) by mouth 3 (three) times daily as needed for cough.   20 capsule   0   . predniSONE (DELTASONE) 20 MG tablet   Oral   Take 3 tablets (60 mg total) by mouth daily.   12 tablet   0   . sulfamethoxazole-trimethoprim (BACTRIM DS) 800-160 MG per tablet   Oral   Take 1 tablet by mouth 2 (two) times daily.   14 tablet   0     BP 170/67  Pulse 65  Temp(Src) 98 F (36.7 C) (Oral)  Resp 20  SpO2  99%  Physical Exam  Nursing note and vitals reviewed. Constitutional: She is oriented to person, place, and time. She appears well-developed and well-nourished. No distress.  HENT:  Head: Normocephalic and atraumatic.  Eyes: Conjunctivae and EOM are normal.  Cardiovascular: Normal rate and regular rhythm.   Pulmonary/Chest: Effort normal and breath sounds normal. No stridor. No respiratory distress.  Abdominal: She exhibits no distension.  Musculoskeletal: She exhibits no edema.  Neurological: She is alert and oriented to person, place, and time. No cranial nerve deficit.  Skin: Skin is warm and dry.  Psychiatric: She has a normal mood and affect.    ED Course  Procedures (including critical care time)  Labs Reviewed  CBC WITH DIFFERENTIAL - Abnormal; Notable for the following:    RBC 3.64 (*)    Hemoglobin 11.4 (*)    HCT 34.1 (*)    Monocytes Relative 13 (*)    All other components within normal limits  COMPREHENSIVE METABOLIC PANEL - Abnormal; Notable for the following:    Glucose, Bld 122 (*)    BUN 75 (*)    Creatinine, Ser 1.84 (*)    Alkaline Phosphatase 121 (*)    Total Bilirubin 0.2 (*)    GFR calc non Af Amer 25 (*)    GFR calc Af Amer 29 (*)    All other components within normal limits  URINALYSIS, ROUTINE W REFLEX MICROSCOPIC - Abnormal; Notable for the following:    APPearance CLOUDY (*)    Leukocytes, UA MODERATE (*)    All other components within normal limits  URINE MICROSCOPIC-ADD ON - Abnormal; Notable for the following:    Squamous Epithelial / LPF FEW (*)    All other components within normal limits  CG4 I-STAT (LACTIC ACID)  Dg Chest 2 View  05/09/2012  *RADIOLOGY REPORT*  Clinical Data: Chest pain, shortness of breath  CHEST - 2 VIEW  Comparison: August 02, 2008.  Findings: Cardiomediastinal silhouette appears normal.  No acute pulmonary disease is noted.  Bony thorax is intact.  IMPRESSION: No acute cardiopulmonary abnormality seen.   Original Report  Authenticated By: Lupita Raider.,  M.D.      1. Cough   2. Renal dysfunction     Cardiac 70 sinus rhythm normal Pulse ox 97% room air normal   Date: 05/10/2012  Rate: 66  Rhythm: normal sinus rhythm  QRS Axis: left  Intervals: normal  ST/T Wave abnormalities: normal  Conduction Disutrbances:left anterior fascicular block  Narrative Interpretation:   Old EKG Reviewed: none available  ABNORMAL  Following return of labs, rate studies I discussed the findings with the patient and her daughter.  We specifically discussed the patient's ongoing renal dysfunction, and the absence of evidence of systemic infection currently.  We discussed the need for both primary care followup and consideration of the specialist for her ongoing renal dysfunction and urinary leakage. Absent evidence of systemic infection, or distress, she was discharged in stable condition with steroids, cough suppressants, antibiotics for possible early urinary tract infection with consideration of bronchitis. MDM  This pleasant elderly female with chronic kidney disease now presents with ongoing fatigue, cough.  On exam she is in no distress, with no hypoxia, tachypnea, evidence of systemic compromise.  Patient's labs to suggest ongoing renal dysfunction, possible urinary tract infection for which she was treated.  Additionally, given her cough, though her lungs sounded essentially clear, she was started on short course of steroids for presumed bronchitis.  We discussed instructions, return precautions at length  Gerhard Munch, MD 05/10/12 [phone]

## 2012-05-13 DIAGNOSIS — Z79899 Other long term (current) drug therapy: Secondary | ICD-10-CM | POA: Diagnosis not present

## 2012-05-13 DIAGNOSIS — N289 Disorder of kidney and ureter, unspecified: Secondary | ICD-10-CM | POA: Diagnosis not present

## 2012-05-13 DIAGNOSIS — J0410 Acute tracheitis without obstruction: Secondary | ICD-10-CM | POA: Diagnosis not present

## 2012-05-25 DIAGNOSIS — I10 Essential (primary) hypertension: Secondary | ICD-10-CM | POA: Diagnosis not present

## 2012-05-25 DIAGNOSIS — N1370 Vesicoureteral-reflux, unspecified: Secondary | ICD-10-CM | POA: Diagnosis not present

## 2012-05-25 DIAGNOSIS — Z79899 Other long term (current) drug therapy: Secondary | ICD-10-CM | POA: Diagnosis not present

## 2012-05-25 DIAGNOSIS — M109 Gout, unspecified: Secondary | ICD-10-CM | POA: Diagnosis not present

## 2012-05-26 DIAGNOSIS — Z96659 Presence of unspecified artificial knee joint: Secondary | ICD-10-CM | POA: Diagnosis not present

## 2012-05-26 DIAGNOSIS — Z13820 Encounter for screening for osteoporosis: Secondary | ICD-10-CM | POA: Diagnosis not present

## 2012-05-26 DIAGNOSIS — Z780 Asymptomatic menopausal state: Secondary | ICD-10-CM | POA: Diagnosis not present

## 2012-05-26 DIAGNOSIS — Z09 Encounter for follow-up examination after completed treatment for conditions other than malignant neoplasm: Secondary | ICD-10-CM | POA: Diagnosis not present

## 2012-05-27 ENCOUNTER — Encounter: Payer: Medicare Other | Attending: Physician Assistant | Admitting: Dietician

## 2012-05-27 ENCOUNTER — Encounter: Payer: Self-pay | Admitting: Dietician

## 2012-05-27 VITALS — Ht 64.0 in | Wt 191.4 lb

## 2012-05-27 DIAGNOSIS — Z713 Dietary counseling and surveillance: Secondary | ICD-10-CM | POA: Insufficient documentation

## 2012-05-27 DIAGNOSIS — E119 Type 2 diabetes mellitus without complications: Secondary | ICD-10-CM | POA: Insufficient documentation

## 2012-05-27 NOTE — Patient Instructions (Addendum)
   Continue to eat regular meals and snacks.  Limit or omit the use of sugar or beverages containing sugar.  Use the sugar substitutes in moderation.    If you have a food label, use it as a resource for identifying carb content and counting carbohydrates.  Always try to have fiber in foods.  Fiber slows the release of the glucose.  Aim for 2 gm of fiber per slice of bread and with cereals and vegetables, aim for 3+ gm of fiber per serving.  Using the food label, try to keep the sugar content in products at (0-9 gm) for the serving for the majority of the day.  Try to use the whole grain breads and cereals.  Have protein at all meals and snacks.  Make this lean and for meals the serving is the palm of your hand and at snacks, 1-2 oz or 1/4 cup of nuts if you are using them.  Aim to bake, broil, grill, roast, stew, steam.  Avoid frying.  Keep the added fats at meals and snacks to 1-2 servings for meals and 1 at snacks.  Use the serving on the food label as a reference.  Aim for 30-45 gm of carb at breakfast, 30 gm at lunch and 30 gm at dinner.  Aim for about 15 gm of carb for snacks.  Plan to have a lean protein at all meals and snacks.  Have a serving of free vegetables at all meals..  Start to get more walking into your routine.  Consider walking in the halls, consider going to a chair exercise class.  Aim for 150 minutes of physical activity each week.  Start slow but work up to this level.

## 2012-05-27 NOTE — Progress Notes (Signed)
Medical Nutrition Therapy:  Appt start time: 1030 end time:  1200.   Assessment:  Primary concerns today: Wants to know what she can eat. Concerned that she is not eating the foods lower in carb.  Has as a goal to not go on medication for her diabetes.  Has during the last 3 months, had 2 runs of prednisone for respiratory issues. This has influenced her blood glucose, in making it be higher.  Fasting levels are lower, but later in the day, she is getting elevated pre-meal levels.  A1C was at 6.7% on 05/04/2012.  While Metformin is the medication for starting to treat blood glucose, her recent creatinine was at the 1.5 mg level.  She lives alone and it would be good if we could get her to add in other glucose lowering lifestyle measures. She is accompanied by her daughter today who is supportive.  The daughter is frustrated in that Ms. Avery will not always let the other siblings know that her diet needs to be limited in carb intake, and when eating at their homes, many meals are high in carb with few or no lower carb alternatives and "Mother does not speak up." Since her MD visit, she has lot 9 lbs.  MEDICATIONS: Completed med review.  BLOOD GLUCOSE MONITORING: Monitoring at variable times.  Has new Free Style meter but did not bring it with her.  Does not recored blood glucose levels, uses meter memory.  Fasting: <120 mg  AC lunch: 150-157  AC dinner: 150-157  HYPOGLYCEMIA: Notes that she has been shaky before a meal and her glucose level was at 167 mg.  Notes that at times she will feel weak and tired.  HYPERGLYCEMIA: Notes hat she is sleepy more often, has drier skin and has had difficulty getting over the recent respiratory infection that seemed to go on and on.  Needed 3 rounds of antibiotics and 2 rounds of Prednisone for recovery.   FOOT SELF-EXAM:  Currently not doing.  Does have an occasional pedicure.  Explained the process and encouraged her to daily use a magnifying mirror to help  with self-examination.    DILATED EYE EXAM: Yearly exam is due in 2-3 months.  DIETARY INTAKE:  Usual eating pattern includes 3 meals and 1-2 snacks per day.  Avoided foods include concentrated sweets, sweetened beverages.  Trying to avoid "white foods."    24-hr recall:  B ( AM): 8:30-9:00 Oatmeal (1 cup)with berries 1/3 cup  or raisins 1/4 cup sweeten with Splenda OR cereal (Raisin Bran and Honey Bunches of Oats about 1 + cup, milk 1% about 1 cup. and fruit ( banana, small or strawberries 2-3)  Coffee, decaf.    Snk ( AM): non  L ( PM): 12:00-12:30 bean burger on bread (loaf 1/2 slice of the multi-grain bread, with lettuce mustard, onion, mayo OR a [place] and cheese with lettuce on multi-grain pita bread one-half .   OR leftovers.  Water Snk ( PM): rare yogurt or cup of tea decaf. D ( PM): 5-6:00 ham, mashed sweet potatoes with spices, splenda molasses, maple syrup 2/3-3/4 cup  Coleslaw 1 cup. Veggies, non-starchy. Water Snk ( PM): 8:00 bowl of sugar free jello 1 cup, almonds,  Beverages: decaf coffee, decaf tea, water (8-10 glasses per day) uses Crystal Light  Usual physical activity: Usually none  Estimated energy needs:HT: 64 in  WT: 191.4 lb  BMI: 32.9 kg/m2    Adj WT:143 lb (65 kg) 1300-1400 calories 150-155 g carbohydrates 100-105  g protein 36-38 g fat  Progress Towards Goal(s):  In progress.   Nutritional Diagnosis:  Utting-2.1 Inpaired nutrition utilization As related to glucose.  As evidenced by diagnosis of type 2 diabetes with A1C at 6.7% and elevated premeal blood glucose levels     .    Intervention:  Nutrition/Diabetes  ontinue to eat regular meals and snacks.  Limit or omit the use of sugar or beverages containing sugar.  Use the sugar substitutes in moderation.    If you have a food label, use it as a resource for identifying carb content and counting carbohydrates.  Always try to have fiber in foods.  Fiber slows the release of the glucose.  Aim for 2 gm of  fiber per slice of bread and with cereals and vegetables, aim for 3+ gm of fiber per serving.  Using the food label, try to keep the sugar content in products at (0-9 gm) for the serving for the majority of the day.  Try to use the whole grain breads and cereals.  Have protein at all meals and snacks.  Make this lean and for meals the serving is the palm of your hand and at snacks, 1-2 oz or 1/4 cup of nuts if you are using them.  Aim to bake, broil, grill, roast, stew, steam.  Avoid frying.  Keep the added fats at meals and snacks to 1-2 servings for meals and 1 at snacks.  Use the serving on the food label as a reference.  Aim for 30-45 gm of carb at breakfast, 30 gm at lunch and 30 gm at dinner.  Aim for about 15 gm of carb for snacks.  Plan to have a lean protein at all meals and snacks.  Have a serving of free vegetables at all meals..  Start to get more walking into your routine.  Consider walking in the halls, consider going to a chair exercise class.  Aim for 150 minutes of physical activity each week.  Start slow but work up to this level.  Record your blood glucose levels.  Handouts given during visit include:  Living Well with Diabetes  Controlling blood glucose  Yellow Card with diet prescription and exchange list  Monitoring/Evaluation:  Dietary intake, exercise, blood glucose levels, and body weight in 8-12 weeks.

## 2012-08-04 DIAGNOSIS — Z79899 Other long term (current) drug therapy: Secondary | ICD-10-CM | POA: Diagnosis not present

## 2012-08-04 DIAGNOSIS — E119 Type 2 diabetes mellitus without complications: Secondary | ICD-10-CM | POA: Diagnosis not present

## 2012-08-04 DIAGNOSIS — E782 Mixed hyperlipidemia: Secondary | ICD-10-CM | POA: Diagnosis not present

## 2012-08-04 DIAGNOSIS — D649 Anemia, unspecified: Secondary | ICD-10-CM | POA: Diagnosis not present

## 2012-08-04 DIAGNOSIS — I10 Essential (primary) hypertension: Secondary | ICD-10-CM | POA: Diagnosis not present

## 2012-08-04 DIAGNOSIS — E559 Vitamin D deficiency, unspecified: Secondary | ICD-10-CM | POA: Diagnosis not present

## 2012-08-10 DIAGNOSIS — N3000 Acute cystitis without hematuria: Secondary | ICD-10-CM | POA: Diagnosis not present

## 2012-08-27 DIAGNOSIS — I10 Essential (primary) hypertension: Secondary | ICD-10-CM | POA: Diagnosis not present

## 2012-08-27 DIAGNOSIS — Z79899 Other long term (current) drug therapy: Secondary | ICD-10-CM | POA: Diagnosis not present

## 2012-10-06 ENCOUNTER — Other Ambulatory Visit (HOSPITAL_COMMUNITY): Payer: Self-pay | Admitting: Internal Medicine

## 2012-10-06 ENCOUNTER — Ambulatory Visit (HOSPITAL_COMMUNITY)
Admission: RE | Admit: 2012-10-06 | Discharge: 2012-10-06 | Disposition: A | Discharge status: Home / Self Care | Payer: Medicare Other | Source: Ambulatory Visit | Attending: Internal Medicine | Admitting: Internal Medicine

## 2012-10-06 DIAGNOSIS — J90 Pleural effusion, not elsewhere classified: Secondary | ICD-10-CM | POA: Insufficient documentation

## 2012-10-06 DIAGNOSIS — I509 Heart failure, unspecified: Secondary | ICD-10-CM | POA: Insufficient documentation

## 2012-10-06 DIAGNOSIS — R0602 Shortness of breath: Secondary | ICD-10-CM | POA: Insufficient documentation

## 2012-10-06 DIAGNOSIS — D649 Anemia, unspecified: Secondary | ICD-10-CM | POA: Diagnosis not present

## 2012-10-06 DIAGNOSIS — J438 Other emphysema: Secondary | ICD-10-CM | POA: Insufficient documentation

## 2012-10-06 DIAGNOSIS — E538 Deficiency of other specified B group vitamins: Secondary | ICD-10-CM | POA: Diagnosis not present

## 2012-10-06 DIAGNOSIS — R5381 Other malaise: Secondary | ICD-10-CM | POA: Diagnosis not present

## 2012-10-06 DIAGNOSIS — R7309 Other abnormal glucose: Secondary | ICD-10-CM | POA: Diagnosis not present

## 2012-10-06 DIAGNOSIS — N390 Urinary tract infection, site not specified: Secondary | ICD-10-CM | POA: Diagnosis not present

## 2012-10-23 DIAGNOSIS — Z23 Encounter for immunization: Secondary | ICD-10-CM | POA: Diagnosis not present

## 2012-11-09 DIAGNOSIS — E559 Vitamin D deficiency, unspecified: Secondary | ICD-10-CM | POA: Diagnosis not present

## 2012-11-09 DIAGNOSIS — I10 Essential (primary) hypertension: Secondary | ICD-10-CM | POA: Diagnosis not present

## 2012-11-09 DIAGNOSIS — R7309 Other abnormal glucose: Secondary | ICD-10-CM | POA: Diagnosis not present

## 2012-11-09 DIAGNOSIS — Z79899 Other long term (current) drug therapy: Secondary | ICD-10-CM | POA: Diagnosis not present

## 2012-11-09 DIAGNOSIS — E782 Mixed hyperlipidemia: Secondary | ICD-10-CM | POA: Diagnosis not present

## 2012-11-09 DIAGNOSIS — N3000 Acute cystitis without hematuria: Secondary | ICD-10-CM | POA: Diagnosis not present

## 2012-11-26 DIAGNOSIS — E119 Type 2 diabetes mellitus without complications: Secondary | ICD-10-CM | POA: Diagnosis not present

## 2013-01-31 DIAGNOSIS — E559 Vitamin D deficiency, unspecified: Secondary | ICD-10-CM | POA: Insufficient documentation

## 2013-01-31 DIAGNOSIS — M109 Gout, unspecified: Secondary | ICD-10-CM | POA: Insufficient documentation

## 2013-02-01 DIAGNOSIS — K219 Gastro-esophageal reflux disease without esophagitis: Secondary | ICD-10-CM | POA: Insufficient documentation

## 2013-02-01 DIAGNOSIS — I10 Essential (primary) hypertension: Secondary | ICD-10-CM | POA: Insufficient documentation

## 2013-02-01 DIAGNOSIS — E782 Mixed hyperlipidemia: Secondary | ICD-10-CM | POA: Insufficient documentation

## 2013-02-01 NOTE — Patient Instructions (Signed)

## 2013-02-01 NOTE — Progress Notes (Signed)
Patient ID: [name] [name], female   DOB: [birth year], 79 y.o.   MRN: [id number]  . Annual Screening Comprehensive Examination  This very nice 79 y.o. WWF presents for complete physical.  Patient has been followed for HTN, Diabetes  Prediabetes, Hyperlipidemia, and Vitamin D Deficiency.   Patient's HTN predates since 87. Patient's BP has been controlled at home.  Patient have CRI with last BUN/Creat 29/1.34 and calc GFR 37 in moderate insufficiency range. Today's BP is  116/68. Patient denies any cardiac symptoms as chest pain, palpitations, shortness of breath, dizziness or ankle swelling.   Patient's hyperlipidemia is controlled with diet and medications. Patient denies myalgias or other medication SE's. Last cholesterol last visit was 165, triglycerides 129, HDL 49 and LDL 90 - all at goal.     Patient has diet controlled T2 NIDDM since 2003  with last A1c 6.4% & insulin 30 in Oct. She reports FBG's range betw 90-120mg %.  Patient denies reactive hypoglycemic symptoms, visual blurring, diabetic polys, or paresthesias.    Finally, patient has history of Vitamin D Deficiency with last vitamin D 60 in October.     Medication Sig Dispense Refill  . acidophilus (RISAQUAD) CAPS Take 1 capsule by mouth every morning.      Marland Kitchen allopurinol (ZYLOPRIM) 300 MG tablet Take 150 mg by mouth every evening.       Marland Kitchen ALPRAZolam (XANAX) 0.25 MG tablet Take 0.25 mg by mouth at bedtime as needed for sleep.      Marland Kitchen aspirin EC 81 MG tablet Take 81 mg by mouth every evening.      Marland Kitchen atenolol (TENORMIN) 100 MG tablet Take 50 mg by mouth every evening.       . cholecalciferol (VITAMIN D) 1000 UNITS tablet Take 2,000 Units by mouth every morning.      Marland Kitchen doxazosin (CARDURA) 8 MG tablet Take 8 mg by mouth at bedtime.      . Flaxseed, Linseed, (FLAX SEED OIL) 1000 MG CAPS Take 1 capsule by mouth every morning.      . furosemide (LASIX) 40 MG tablet Take 20 mg by mouth every morning. 20 mg on Mon, Wed, Fri, and Sat if  needed.      . gabapentin (NEURONTIN) 300 MG capsule Take 300 mg by mouth at bedtime.      Marland Kitchen gemfibrozil (LOPID) 600 MG tablet Take 600 mg by mouth 2 (two) times daily.      Marland Kitchen guaiFENesin (MUCINEX) 600 MG 12 hr tablet Take 600 mg by mouth 3 (three) times daily.      Marland Kitchen loratadine (CLARITIN) 10 MG tablet Take 10 mg by mouth every morning.      Marland Kitchen losartan (COZAAR) 100 MG tablet Take 100 mg by mouth every morning.      Marland Kitchen omeprazole (PRILOSEC) 40 MG capsule Take 20 mg by mouth every morning.       . vitamin B-12 (CYANOCOBALAMIN) 1000 MCG tablet Take 1,000 mcg by mouth every morning.      . vitamin C (ASCORBIC ACID) 500 MG tablet Take 500 mg by mouth 3 (three) times daily.         Allergies  Allergen Reactions  . Codeine   . Lisinopril Other (See Comments)    fatigue  . Tylenol Pm Extra [Diphenhydramine-Apap (Sleep)]     Past Medical History  Diagnosis Date  . Obesity   . Hyperlipidemia   . Hypertension   . Type II or unspecified type diabetes mellitus without mention of complication,  not stated as uncontrolled   . Allergy   . Arthritis   . Gout   . Vitamin D deficiency     Past Surgical History  Procedure Laterality Date  . Joint replacement    . Dilitation and currage      Family History  Problem Relation Age of Onset  . Hypertension Mother   . Stroke Mother   . Asthma Father   . Hypertension Father   . Hyperlipidemia Father   . Heart attack Father   . Diabetes Brother   . Sleep apnea Daughter   . Asthma Son   . Hypertension Son     History  Substance Use Topics  . Smoking status: Never Smoker   . Smokeless tobacco: Never Used  . Alcohol Use: No    ROS Constitutional: Denies fever, chills, weight loss/gain, headaches, insomnia, fatigue, night sweats, and change in appetite. Eyes: Denies redness, blurred vision, diplopia, discharge, itchy, watery eyes.  ENT: Denies discharge, congestion, post nasal drip, epistaxis, sore throat, earache, hearing loss, dental  pain, Tinnitus, Vertigo, Sinus pain, snoring.  Cardio: Denies chest pain, palpitations, irregular heartbeat, syncope, dyspnea, diaphoresis, orthopnea, PND, claudication, edema Respiratory: denies cough, dyspnea, DOE, pleurisy, hoarseness, laryngitis, wheezing.  Gastrointestinal: Denies dysphagia, heartburn, reflux, water brash, pain, cramps, nausea, vomiting, bloating, diarrhea, constipation, hematemesis, melena, hematochezia, jaundice, hemorrhoids Genitourinary: Denies dysuria, frequency, urgency, nocturia, hesitancy, discharge, hematuria, flank pain Breast:Breast lumps, nipple discharge, bleeding.  Musculoskeletal: Denies arthralgia, myalgia, stiffness, Jt. Swelling, pain, limp, and strain/sprain. Skin: Denies puritis, rash, hives, warts, acne, eczema, changing in skin lesion Neuro: No weakness, tremor, incoordination, spasms, paresthesia, pain Psychiatric: Denies confusion, memory loss, sensory loss Endocrine: Denies change in weight, skin, hair change, nocturia, and paresthesia, diabetic polys, visual blurring, hyper / hypo glycemic episodes.  Heme/Lymph: No excessive bleeding, bruising, enlarged lymph nodes.  BP: 116/68  Pulse: 64  Temp: 97.7 F (36.5 C)  Resp: 18    Estimated body mass index is 34.45 kg/(m^2) as calculated from the following:   Height as of this encounter: 5\' 4"  (1.626 m).   Weight as of this encounter: 200 lb 12.8 oz (91.082 kg).  Physical Exam General Appearance: Well nourished, in no apparent distress. Eyes: PERRLA, EOMs, conjunctiva no swelling or erythema, normal fundi and vessels. Sinuses: No frontal/maxillary tenderness ENT/Mouth: EACs patent / TMs  nl. Nares clear without erythema, swelling, mucoid exudates. Oral hygiene is good. No erythema, swelling, or exudate. Tongue normal, non-obstructing. Tonsils not swollen or erythematous. Hearing normal.  Neck: Supple, thyroid normal. No bruits, nodes or JVD. Respiratory: Respiratory effort normal.  BS equal and  clear bilateral without rales, rhonci, wheezing or stridor. Cardio: Heart sounds are normal with regular rate and rhythm and no murmurs, rubs or gallops. Peripheral pulses are normal and equal bilaterally without edema. No aortic or femoral bruits. Chest: symmetric with normal excursions and percussion. Breasts: Symmetric, without lumps, nipple discharge, retractions, or fibrocystic changes.  Abdomen: Flat, soft, with bowl sounds. Nontender, no guarding, rebound, hernias, masses, or organomegaly.  Lymphatics: Non tender without lymphadenopathy.  Genitourinary:  Musculoskeletal: Full ROM all peripheral extremities, joint stability, 5/5 strength, and normal gait. Skin: Warm and dry without rashes, lesions, cyanosis, clubbing or  ecchymosis.  Neuro: Cranial nerves intact, reflexes equal bilaterally. Normal muscle tone, no cerebellar symptoms. Sensation intact.  Pysch: Awake and oriented X 3, normal affect, Insight and Judgment appropriate.   Assessment and Plan  1. Annual Screening Examination 2. Hypertension  3. Hyperlipidemia 4. T2 NIDDM, diet w/Renal  Impairment 5. Vitamin D Deficiency 6. Recurrent UTI's 7. DJD 8. Gout 9. GERD  Continue prudent diet as discussed, weight control, BP monitoring, regular exercise, and medications. Discussed med's effects and SE's. Screening labs and tests as requested with regular follow-up as recommended.

## 2013-02-02 ENCOUNTER — Ambulatory Visit (INDEPENDENT_AMBULATORY_CARE_PROVIDER_SITE_OTHER): Payer: Medicare Other | Admitting: Internal Medicine

## 2013-02-02 ENCOUNTER — Encounter: Payer: Self-pay | Admitting: Internal Medicine

## 2013-02-02 VITALS — BP 116/68 | HR 64 | Temp 97.7°F | Resp 18 | Ht 64.0 in | Wt 200.8 lb

## 2013-02-02 DIAGNOSIS — I10 Essential (primary) hypertension: Secondary | ICD-10-CM | POA: Diagnosis not present

## 2013-02-02 DIAGNOSIS — Z1212 Encounter for screening for malignant neoplasm of rectum: Secondary | ICD-10-CM

## 2013-02-02 DIAGNOSIS — E1129 Type 2 diabetes mellitus with other diabetic kidney complication: Secondary | ICD-10-CM | POA: Diagnosis not present

## 2013-02-02 DIAGNOSIS — Z79899 Other long term (current) drug therapy: Secondary | ICD-10-CM | POA: Diagnosis not present

## 2013-02-02 DIAGNOSIS — N3000 Acute cystitis without hematuria: Secondary | ICD-10-CM | POA: Diagnosis not present

## 2013-02-02 DIAGNOSIS — Z0000 Encounter for general adult medical examination without abnormal findings: Secondary | ICD-10-CM

## 2013-02-02 DIAGNOSIS — E782 Mixed hyperlipidemia: Secondary | ICD-10-CM

## 2013-02-02 DIAGNOSIS — E559 Vitamin D deficiency, unspecified: Secondary | ICD-10-CM

## 2013-02-02 DIAGNOSIS — N6019 Diffuse cystic mastopathy of unspecified breast: Secondary | ICD-10-CM

## 2013-02-02 LAB — HEPATIC FUNCTION PANEL
ALT: 11 U/L (ref 0–35)
AST: 18 U/L (ref 0–37)
Albumin: 4.3 g/dL (ref 3.5–5.2)
Alkaline Phosphatase: 94 U/L (ref 39–117)
Bilirubin, Direct: 0.1 mg/dL (ref 0.0–0.3)
Indirect Bilirubin: 0.3 mg/dL (ref 0.0–0.9)
Total Bilirubin: 0.4 mg/dL (ref 0.3–1.2)
Total Protein: 6.6 g/dL (ref 6.0–8.3)

## 2013-02-02 LAB — CBC WITH DIFFERENTIAL/PLATELET
Basophils Absolute: 0 10*3/uL (ref 0.0–0.1)
Basophils Relative: 1 % (ref 0–1)
HCT: 36 % (ref 36.0–46.0)
Hemoglobin: 12.1 g/dL (ref 12.0–15.0)
Lymphocytes Relative: 29 % (ref 12–46)
MCHC: 33.6 g/dL (ref 30.0–36.0)
Monocytes Absolute: 0.7 10*3/uL (ref 0.1–1.0)
Monocytes Relative: 10 % (ref 3–12)
Neutro Abs: 3.8 10*3/uL (ref 1.7–7.7)
Neutrophils Relative %: 56 % (ref 43–77)
Platelets: 201 10*3/uL (ref 150–400)
RBC: 3.95 MIL/uL (ref 3.87–5.11)
WBC: 6.7 10*3/uL (ref 4.0–10.5)

## 2013-02-02 LAB — BASIC METABOLIC PANEL WITH GFR
BUN: 55 mg/dL — ABNORMAL HIGH (ref 6–23)
CO2: 26 mEq/L (ref 19–32)
Calcium: 10 mg/dL (ref 8.4–10.5)
Chloride: 101 mEq/L (ref 96–112)
Creat: 1.72 mg/dL — ABNORMAL HIGH (ref 0.50–1.10)
GFR, Est African American: 32 mL/min — ABNORMAL LOW
GFR, Est Non African American: 28 mL/min — ABNORMAL LOW
Glucose, Bld: 119 mg/dL — ABNORMAL HIGH (ref 70–99)
Potassium: 4.5 mEq/L (ref 3.5–5.3)
Sodium: 138 mEq/L (ref 135–145)

## 2013-02-02 LAB — LIPID PANEL
Cholesterol: 171 mg/dL (ref 0–200)
Total CHOL/HDL Ratio: 3.5 Ratio
Triglycerides: 161 mg/dL — ABNORMAL HIGH (ref <150)
VLDL: 32 mg/dL (ref 0–40)

## 2013-02-02 LAB — TSH: TSH: 1.763 u[IU]/mL (ref 0.350–4.500)

## 2013-02-03 LAB — INSULIN, FASTING: Insulin fasting, serum: 66 u[IU]/mL — ABNORMAL HIGH (ref 3–28)

## 2013-02-03 LAB — MICROALBUMIN / CREATININE URINE RATIO
Creatinine, Urine: 106.6 mg/dL
Microalb Creat Ratio: 4.7 mg/g (ref 0.0–30.0)

## 2013-02-03 LAB — URINALYSIS, MICROSCOPIC ONLY: Crystals: NONE SEEN

## 2013-02-04 LAB — URINE CULTURE
Colony Count: NO GROWTH
Organism ID, Bacteria: NO GROWTH

## 2013-02-12 ENCOUNTER — Telehealth: Payer: Self-pay | Admitting: *Deleted

## 2013-02-12 ENCOUNTER — Other Ambulatory Visit: Payer: Self-pay | Admitting: Internal Medicine

## 2013-02-12 DIAGNOSIS — N189 Chronic kidney disease, unspecified: Secondary | ICD-10-CM

## 2013-02-12 NOTE — Telephone Encounter (Signed)
Daughter, Pam, called. States patient upset by recent lab results and requesting referrals to endocrinlogist and nephrologist.  Per Dr Melford Aase, patient's A1c was 6.4 at last check and does not start DM rx until A1c is 7.0 due to meds causing weight gain and insulin level of 66 is due to not sticking to DM diet.  He says insulin will go down with diet improvement and weight loss.  In regard to nephrologist, he would tell her to increase fluids, decrease protein in diet and then she would have to increase starches, which would increase glucose level. Daughter will discuss with mother and will call back if she wants referral

## 2013-02-15 DIAGNOSIS — M47817 Spondylosis without myelopathy or radiculopathy, lumbosacral region: Secondary | ICD-10-CM | POA: Diagnosis not present

## 2013-02-18 ENCOUNTER — Other Ambulatory Visit: Payer: Self-pay | Admitting: *Deleted

## 2013-02-18 ENCOUNTER — Ambulatory Visit (HOSPITAL_COMMUNITY): Payer: No Typology Code available for payment source

## 2013-02-18 DIAGNOSIS — Z1212 Encounter for screening for malignant neoplasm of rectum: Secondary | ICD-10-CM

## 2013-02-18 LAB — POC HEMOCCULT BLD/STL (HOME/3-CARD/SCREEN)
Card #2 Fecal Occult Blod, POC: NEGATIVE
FECAL OCCULT BLD: NEGATIVE
FECAL OCCULT BLD: NEGATIVE

## 2013-02-23 ENCOUNTER — Other Ambulatory Visit: Payer: Self-pay | Admitting: Internal Medicine

## 2013-02-23 ENCOUNTER — Ambulatory Visit (HOSPITAL_COMMUNITY)
Admission: RE | Admit: 2013-02-23 | Discharge: 2013-02-23 | Disposition: A | Discharge status: Home / Self Care | Payer: Medicare Other | Source: Ambulatory Visit | Attending: Internal Medicine | Admitting: Internal Medicine

## 2013-02-23 DIAGNOSIS — Z1231 Encounter for screening mammogram for malignant neoplasm of breast: Secondary | ICD-10-CM | POA: Diagnosis not present

## 2013-02-23 DIAGNOSIS — N6019 Diffuse cystic mastopathy of unspecified breast: Secondary | ICD-10-CM

## 2013-02-24 ENCOUNTER — Other Ambulatory Visit: Payer: Self-pay | Admitting: Internal Medicine

## 2013-02-24 DIAGNOSIS — R928 Other abnormal and inconclusive findings on diagnostic imaging of breast: Secondary | ICD-10-CM

## 2013-02-26 ENCOUNTER — Telehealth: Payer: Self-pay | Admitting: *Deleted

## 2013-02-26 DIAGNOSIS — M47817 Spondylosis without myelopathy or radiculopathy, lumbosacral region: Secondary | ICD-10-CM | POA: Diagnosis not present

## 2013-02-26 MED ORDER — HYOSCYAMINE SULFATE 0.125 MG PO TABS: 0.1250 mg | ORAL_TABLET | ORAL | Status: DC | PRN

## 2013-02-26 NOTE — Telephone Encounter (Signed)
Daughter called,  Patient having diarrhea and vomiting with abdominal pain. RX called to [facility] for Hyoscyamine 0.125 mg per Dr Melford Aase.

## 2013-03-10 ENCOUNTER — Other Ambulatory Visit: Payer: Self-pay | Admitting: Internal Medicine

## 2013-03-10 ENCOUNTER — Ambulatory Visit
Admission: RE | Admit: 2013-03-10 | Discharge: 2013-03-10 | Disposition: A | Discharge status: Home / Self Care | Payer: Medicare Other | Source: Ambulatory Visit | Attending: Internal Medicine | Admitting: Internal Medicine

## 2013-03-10 DIAGNOSIS — N63 Unspecified lump in unspecified breast: Secondary | ICD-10-CM | POA: Diagnosis not present

## 2013-03-10 DIAGNOSIS — D249 Benign neoplasm of unspecified breast: Secondary | ICD-10-CM | POA: Diagnosis not present

## 2013-03-10 DIAGNOSIS — R928 Other abnormal and inconclusive findings on diagnostic imaging of breast: Secondary | ICD-10-CM

## 2013-03-11 DIAGNOSIS — Q762 Congenital spondylolisthesis: Secondary | ICD-10-CM | POA: Diagnosis not present

## 2013-03-15 DIAGNOSIS — M48061 Spinal stenosis, lumbar region without neurogenic claudication: Secondary | ICD-10-CM | POA: Diagnosis not present

## 2013-03-16 ENCOUNTER — Telehealth: Payer: Self-pay

## 2013-03-16 ENCOUNTER — Other Ambulatory Visit: Payer: Self-pay | Admitting: Internal Medicine

## 2013-03-16 NOTE — Telephone Encounter (Signed)
Pt's daughter, Jeannene Patella, called to check status of referral to nephrologist. Lmom at [facility] to check status of referral and waiting for them to return my call. Pam aware.

## 2013-04-15 DIAGNOSIS — M48061 Spinal stenosis, lumbar region without neurogenic claudication: Secondary | ICD-10-CM | POA: Diagnosis not present

## 2013-04-30 DIAGNOSIS — M48061 Spinal stenosis, lumbar region without neurogenic claudication: Secondary | ICD-10-CM | POA: Diagnosis not present

## 2013-05-03 ENCOUNTER — Ambulatory Visit (INDEPENDENT_AMBULATORY_CARE_PROVIDER_SITE_OTHER): Payer: Medicare Other | Admitting: Physician Assistant

## 2013-05-03 VITALS — BP 138/80 | HR 64 | Temp 97.9°F | Resp 16 | Wt 192.0 lb

## 2013-05-03 DIAGNOSIS — R0600 Dyspnea, unspecified: Secondary | ICD-10-CM

## 2013-05-03 DIAGNOSIS — E782 Mixed hyperlipidemia: Secondary | ICD-10-CM | POA: Diagnosis not present

## 2013-05-03 DIAGNOSIS — Z79899 Other long term (current) drug therapy: Secondary | ICD-10-CM

## 2013-05-03 DIAGNOSIS — Z0000 Encounter for general adult medical examination without abnormal findings: Secondary | ICD-10-CM | POA: Diagnosis not present

## 2013-05-03 DIAGNOSIS — E1129 Type 2 diabetes mellitus with other diabetic kidney complication: Secondary | ICD-10-CM

## 2013-05-03 DIAGNOSIS — I10 Essential (primary) hypertension: Secondary | ICD-10-CM | POA: Diagnosis not present

## 2013-05-03 DIAGNOSIS — F329 Major depressive disorder, single episode, unspecified: Secondary | ICD-10-CM | POA: Diagnosis not present

## 2013-05-03 DIAGNOSIS — E559 Vitamin D deficiency, unspecified: Secondary | ICD-10-CM

## 2013-05-03 DIAGNOSIS — F3289 Other specified depressive episodes: Secondary | ICD-10-CM | POA: Diagnosis not present

## 2013-05-03 DIAGNOSIS — Z1331 Encounter for screening for depression: Secondary | ICD-10-CM

## 2013-05-03 DIAGNOSIS — N3000 Acute cystitis without hematuria: Secondary | ICD-10-CM | POA: Diagnosis not present

## 2013-05-03 LAB — CBC WITH DIFFERENTIAL/PLATELET
Basophils Absolute: 0.1 10*3/uL (ref 0.0–0.1)
Basophils Relative: 1 % (ref 0–1)
Eosinophils Absolute: 0.3 10*3/uL (ref 0.0–0.7)
Eosinophils Relative: 5 % (ref 0–5)
HCT: 37.8 % (ref 36.0–46.0)
Hemoglobin: 12.6 g/dL (ref 12.0–15.0)
LYMPHS PCT: 30 % (ref 12–46)
Lymphs Abs: 1.9 10*3/uL (ref 0.7–4.0)
MCH: 31 pg (ref 26.0–34.0)
MCHC: 33.3 g/dL (ref 30.0–36.0)
MCV: 93.1 fL (ref 78.0–100.0)
Monocytes Absolute: 0.6 10*3/uL (ref 0.1–1.0)
Monocytes Relative: 9 % (ref 3–12)
NEUTROS PCT: 55 % (ref 43–77)
Neutro Abs: 3.4 10*3/uL (ref 1.7–7.7)
PLATELETS: 200 10*3/uL (ref 150–400)
RBC: 4.06 MIL/uL (ref 3.87–5.11)
RDW: 14.5 % (ref 11.5–15.5)
WBC: 6.2 10*3/uL (ref 4.0–10.5)

## 2013-05-03 NOTE — Progress Notes (Addendum)
Subjective:   [name] [name] is a 80 y.o. female who presents for Medicare Annual Wellness Visit and 3 month follow up on hypertension, prediabetes, hyperlipidemia, vitamin D def.  Date of last medicare wellness visit is unknown.   Her blood pressure has been controlled at home, today their BP is BP: 138/80 mmHg She does workout, works out in yard, walks in house, will start to do stationary bike. She denies chest pain, dizziness. She states she gets SOB with bringing in groceries for a year or so, she gets upper back pain occ with stairs and has increasing fatigue. Has has CXR 10/2012 that suggest emphysema and CHF.  She is on cholesterol medication, lopid, and denies myalgias. Her cholesterol is at goal. The cholesterol last visit was:   Lab Results  Component Value Date   CHOL 171 02/02/2013   HDL 49 02/02/2013   LDLCALC 90 02/02/2013   TRIG 161* 02/02/2013   CHOLHDL 3.5 02/02/2013   She has been working on diet and exercise for prediabetes, and denies polydipsia, polyuria and visual disturbances. Last A1C in the office was: 6.4 Patient is on Vitamin D supplement. She states that her balance has been off for sometime now, she had a knee replacement and states it has not been the same since then, she uses a cane.   Names of Other Physician/Practitioners you currently use: 1. [facility]- here for primary care 2. Dr. Sabra Heck, eye doctor, last visit Aug 2014  Dr. Kathyrn Lass Dr. Eddie Dibbles Patient Care Team: Unk Pinto, MD as PCP - General (Internal Medicine)  Medication Review Current Outpatient Prescriptions on File Prior to Visit  Medication Sig Dispense Refill  . acidophilus (RISAQUAD) CAPS Take 1 capsule by mouth every morning.      Marland Kitchen allopurinol (ZYLOPRIM) 300 MG tablet Take 150 mg by mouth every evening.       Marland Kitchen ALPRAZolam (XANAX) 1 MG tablet Take 1 mg by mouth at bedtime as needed for anxiety. Takes 1/2 - 1 tab at bedtime      . aspirin EC  81 MG tablet Take 81 mg by mouth every evening.      Marland Kitchen atenolol (TENORMIN) 100 MG tablet Take 50 mg by mouth every evening.       . cholecalciferol (VITAMIN D) 1000 UNITS tablet Take 3,000 Units by mouth every morning.       Marland Kitchen doxazosin (CARDURA) 8 MG tablet Take 8 mg by mouth at bedtime.      . Flaxseed, Linseed, (FLAX SEED OIL) 1000 MG CAPS Take 1 capsule by mouth every morning.      . furosemide (LASIX) 40 MG tablet TAKE 1/2 TO 1 TABLET EVERY MORNING FOR BLOOD PRESSURE/FLUID  90 tablet  2  . gabapentin (NEURONTIN) 300 MG capsule Take 300 mg by mouth at bedtime.      Marland Kitchen gemfibrozil (LOPID) 600 MG tablet TAKE 1 TABLET BY MOUTH TWICE DAILY WITH MEALS FOR CHOLESTEROL  180 tablet  8  . loratadine (CLARITIN) 10 MG tablet Take 10 mg by mouth every morning.      Marland Kitchen losartan (COZAAR) 100 MG tablet Take 100 mg by mouth every morning.      Marland Kitchen omeprazole (PRILOSEC) 40 MG capsule Take 40 mg by mouth every morning.       . vitamin B-12 (CYANOCOBALAMIN) 1000 MCG tablet Take 1,000 mcg by mouth every morning.      . vitamin C (ASCORBIC ACID) 500 MG tablet Take 500 mg by  mouth 3 (three) times daily.       No current facility-administered medications on file prior to visit.    Current Problems (verified) Patient Active Problem List   Diagnosis Date Noted  . Routine general medical examination at a health care facility 02/02/2013  . Type II or unspecified type diabetes mellitus with renal manifestations, not stated as uncontrolled 02/01/2013  . Unspecified essential hypertension 02/01/2013  . Mixed hyperlipidemia 02/01/2013  . Reflux esophagitis 02/01/2013  . Acute cystitis 02/01/2013  . Gout   . Vitamin D deficiency     Screening Tests Health Maintenance  Topic Date Due  . Colonoscopy  02/17/1982  . Zostavax  02/18/1992  . Influenza Vaccine  09/04/2013  . Tetanus/tdap  12/22/2018  . Pneumococcal Polysaccharide Vaccine Age 41 And Over  Completed     Immunization History  Administered Date(s)  Administered  . Influenza Split 10/23/2012  . Pneumococcal Polysaccharide-23 02/04/2006  . Td 12/21/2008    Preventative care: Last colonoscopy: 2006 Last mammogram: Jan 2015 Last pap smear/pelvic exam: remote   DEXA: 05/26/2012  Prior vaccinations: TD or Tdap: 2010  Influenza: 10/2012  Pneumococcal: 2008 Shingles/Zostavax: declines  History reviewed: allergies, current medications, past family history, past medical history, past social history, past surgical history and problem list  Risk Factors: Osteoporosis: postmenopausal estrogen deficiency and dietary calcium and/or vitamin D deficiency History of fracture in the past year: no  Tobacco History  Substance Use Topics  . Smoking status: Never Smoker   . Smokeless tobacco: Never Used  . Alcohol Use: No   She does not smoke.  Patient is not a former smoker. Are there smokers in your home (other than you)?  No  Alcohol Current alcohol use: none  Caffeine Current caffeine use: coffee 1 /day  Exercise Exercise limitations: The patient is experiencing exercise intolerance (balance, osteoarthritis). Current exercise: housecleaning and walking  Nutrition/Diet Current diet: in general, a "healthy" diet    Cardiac risk factors: advanced age (older than 75 for men, 19 for women), diabetes mellitus, dyslipidemia, hypertension, obesity (BMI >= 30 kg/m2) and sedentary lifestyle.  Depression Screen Nurse depression screen reviewed.  (Note: if answer to either of the following is "Yes", a more complete depression screening is indicated)   Q1: Over the past two weeks, have you felt down, depressed or hopeless? No  Q2: Over the past two weeks, have you felt little interest or pleasure in doing things? No  Have you lost interest or pleasure in daily life? No  Do you often feel hopeless? No  Do you cry easily over simple problems? No  Activities of Daily Living Nurse ADLs screen reviewed.  In your present state of  health, do you have any difficulty performing the following activities?:  Driving? Yes Managing money?  No Feeding yourself? No Getting from bed to chair? No Climbing a flight of stairs? Yes Preparing food and eating?: No Bathing or showering? No Getting dressed: No Getting to the toilet? No Using the toilet:No Moving around from place to place: Yes In the past year have you fallen or had a near fall?:Yes   Are you sexually active?  No  Do you have more than one partner?  No  Vision Difficulties: No  Hearing Difficulties: No Do you often ask people to speak up or repeat themselves? No Do you experience ringing or noises in your ears? No Do you have difficulty understanding soft or whispered voices? No  Cognition  Do you feel that you have a  problem with memory?No  Do you often misplace items? No  Do you feel safe at home?  Yes  Advanced directives Does patient have a Amery? Yes Does patient have a Living Will? Yes    Objective:     Vision and hearing screens reviewed.   Blood pressure 138/80, pulse 64, temperature 97.9 F (36.6 C), resp. rate 16, weight 192 lb (87.091 kg). Body mass index is 32.94 kg/(m^2).  General appearance: alert, no distress, WD/WN,  female Cognitive Testing  Alert? Yes  Normal Appearance?Yes  Oriented to person? Yes  Place? Yes   Time? Yes  Recall of three objects?  No 2/3  Can perform simple calculations? Yes  Displays appropriate judgment?Yes  Can read the correct time from a watch face?Yes  HEENT: normocephalic, sclerae anicteric, TMs pearly, nares patent, no discharge or erythema, pharynx normal Oral cavity: MMM, no lesions Neck: supple, no lymphadenopathy, no thyromegaly, no masses Heart: RRR, normal S1, S2, no murmurs Lungs: CTA bilaterally, no wheezes, rhonchi, or rales Abdomen: +bs, soft, obese non tender, non distended, no masses, no hepatomegaly, no splenomegaly Musculoskeletal: nontender, no  swelling, no obvious deformity Extremities: 1-2 + edema, no cyanosis, no clubbing Pulses: 2+ symmetric, upper and lower extremities, normal cap refill Neurological: alert, oriented x 3, CN2-12 intact, strength normal upper extremities and lower extremities, sensation decreased bilateral feet, DTRs 2+ throughout, + romberg, negative finger to nose, gait slow with cane Psychiatric: normal affect, behavior normal, pleasant  Breast:defer Gyn: defer  Rectal: defer   Assessment:   1. Unspecified essential hypertension - CBC with Differential - BASIC METABOLIC PANEL WITH GFR - Hepatic function panel - TSH  2. Type II or unspecified type diabetes mellitus with renal manifestations, not stated as uncontrolled - Hemoglobin A1c  3. Vitamin D deficiency  4. Mixed hyperlipidemia - Lipid panel  5. Acute cystitis - Urinalysis, Routine w reflex microscopic - Urine culture + UTI- Sensitive to Bactrim DS #14 sent into pharmacy.   6. Encounter for long-term (current) use of other medications - Magnesium  7. Dyspnea Patient with increased risk for MI, increasing dyspnea with exertion with fatigue and upper atypical back pain. Recent CXR showing possible CHF. Suggest follow up with cardio for possible stress echo pending their evaluation - Ambulatory referral to Cardiology   Plan:   During the course of the visit the patient was educated and counseled about appropriate screening and preventive services including:    Pneumococcal vaccine   Influenza vaccine  Td vaccine  Screening electrocardiogram  Screening mammography  Bone densitometry screening  Colorectal cancer screening  Diabetes screening  Glaucoma screening  Nutrition counseling   Screening recommendations, referrals:  Vaccinations: Tdap vaccine not done  Influenza vaccine not done Pneumococcal vaccine not done Shingles vaccine no Hep B vaccine not done  Nutrition assessed and recommended  Colonoscopy  yes Mammogram yes Pap smear no Pelvic exam no Recommended yearly ophthalmology/optometry visit for glaucoma screening and checkup Recommended yearly dental visit for hygiene and checkup Advanced directives - yes  Conditions/risks identified: BMI: Discussed weight loss, diet, and increase physical activity.  Increase physical activity: AHA recommends 150 minutes of physical activity a week.  Medications reviewed DEXA- requested next year Diabetes is at goal, ACE/ARB therapy: Yes. Urinary Incontinence is an issue: discussed non pharmacology and pharmacology options. Does not want meds at this time.  Fall risk: high- discussed PT, home fall assessment, medications. Will do physical therapy for balance, going out of town to visit  son and wants to do when she gets back.   Medicare Attestation I have personally reviewed: The patient's medical and social history Their use of alcohol, tobacco or illicit drugs Their current medications and supplements The patient's functional ability including ADLs,fall risks, home safety risks, cognitive, and hearing and visual impairment Diet and physical activities Evidence for depression or mood disorders  The patient's weight, height, BMI, and visual acuity have been recorded in the chart.  I have made referrals, counseling, and provided education to the patient based on review of the above and I have provided the patient with a written personalized care plan for preventive services.     Vicie Mutters, PA-C   05/03/2013

## 2013-05-03 NOTE — Patient Instructions (Signed)

## 2013-05-04 LAB — BASIC METABOLIC PANEL WITH GFR
BUN: 45 mg/dL — ABNORMAL HIGH (ref 6–23)
CALCIUM: 9.6 mg/dL (ref 8.4–10.5)
CHLORIDE: 103 meq/L (ref 96–112)
CO2: 26 mEq/L (ref 19–32)
Creat: 1.47 mg/dL — ABNORMAL HIGH (ref 0.50–1.10)
GFR, EST NON AFRICAN AMERICAN: 33 mL/min — AB
GFR, Est African American: 38 mL/min — ABNORMAL LOW
Glucose, Bld: 99 mg/dL (ref 70–99)
Potassium: 4.3 mEq/L (ref 3.5–5.3)
SODIUM: 141 meq/L (ref 135–145)

## 2013-05-04 LAB — URINALYSIS, ROUTINE W REFLEX MICROSCOPIC
BILIRUBIN URINE: NEGATIVE
Glucose, UA: NEGATIVE mg/dL
Hgb urine dipstick: NEGATIVE
Ketones, ur: NEGATIVE mg/dL
Nitrite: NEGATIVE
PROTEIN: NEGATIVE mg/dL
Specific Gravity, Urine: 1.009 (ref 1.005–1.030)
Urobilinogen, UA: 0.2 mg/dL (ref 0.0–1.0)
pH: 6 (ref 5.0–8.0)

## 2013-05-04 LAB — LIPID PANEL
CHOL/HDL RATIO: 2.8 ratio
CHOLESTEROL: 150 mg/dL (ref 0–200)
HDL: 53 mg/dL (ref >39)
LDL Cholesterol: 83 mg/dL (ref 0–99)
Triglycerides: 68 mg/dL (ref <150)
VLDL: 14 mg/dL (ref 0–40)

## 2013-05-04 LAB — HEPATIC FUNCTION PANEL
ALK PHOS: 109 U/L (ref 39–117)
ALT: 12 U/L (ref 0–35)
AST: 22 U/L (ref 0–37)
Albumin: 4.3 g/dL (ref 3.5–5.2)
BILIRUBIN DIRECT: 0.1 mg/dL (ref 0.0–0.3)
Indirect Bilirubin: 0.4 mg/dL (ref 0.2–1.2)
Total Bilirubin: 0.5 mg/dL (ref 0.2–1.2)
Total Protein: 6.9 g/dL (ref 6.0–8.3)

## 2013-05-04 LAB — URINALYSIS, MICROSCOPIC ONLY
Bacteria, UA: NONE SEEN
CRYSTALS: NONE SEEN
Casts: NONE SEEN

## 2013-05-04 LAB — HEMOGLOBIN A1C
HEMOGLOBIN A1C: 6.4 % — AB (ref <5.7)
Mean Plasma Glucose: 137 mg/dL — ABNORMAL HIGH (ref <117)

## 2013-05-04 LAB — TSH: TSH: 1.375 u[IU]/mL (ref 0.350–4.500)

## 2013-05-04 LAB — MAGNESIUM: Magnesium: 1.9 mg/dL (ref 1.5–2.5)

## 2013-05-07 LAB — URINE CULTURE

## 2013-05-07 MED ORDER — SULFAMETHOXAZOLE-TMP DS 800-160 MG PO TABS: 1.0000 | ORAL_TABLET | Freq: Two times a day (BID) | ORAL | Status: DC

## 2013-05-07 NOTE — Addendum Note (Signed)
Addended by: Vicie Mutters R on: 05/07/2013 07:26 AM   Modules accepted: Orders

## 2013-05-17 ENCOUNTER — Telehealth: Payer: Self-pay | Admitting: *Deleted

## 2013-05-17 MED ORDER — FLUCONAZOLE 150 MG PO TABS: 150.0000 mg | ORAL_TABLET | Freq: Every day | ORAL | Status: DC

## 2013-05-17 NOTE — Telephone Encounter (Signed)
Pt was recently on a sulfa antibiotic has developed a vaginal yeast infection asking can we call in rx?  [facility]

## 2013-05-26 ENCOUNTER — Ambulatory Visit: Payer: Medicare Other | Admitting: Cardiology

## 2013-05-27 ENCOUNTER — Other Ambulatory Visit: Payer: Self-pay | Admitting: Emergency Medicine

## 2013-05-27 ENCOUNTER — Other Ambulatory Visit: Payer: Self-pay | Admitting: Internal Medicine

## 2013-06-08 ENCOUNTER — Ambulatory Visit (INDEPENDENT_AMBULATORY_CARE_PROVIDER_SITE_OTHER): Payer: Medicare Other | Admitting: Cardiology

## 2013-06-08 ENCOUNTER — Encounter: Payer: Self-pay | Admitting: Cardiology

## 2013-06-08 VITALS — BP 131/71 | HR 65 | Ht 64.0 in | Wt 194.0 lb

## 2013-06-08 DIAGNOSIS — R0989 Other specified symptoms and signs involving the circulatory and respiratory systems: Secondary | ICD-10-CM

## 2013-06-08 DIAGNOSIS — I10 Essential (primary) hypertension: Secondary | ICD-10-CM | POA: Diagnosis not present

## 2013-06-08 DIAGNOSIS — E782 Mixed hyperlipidemia: Secondary | ICD-10-CM

## 2013-06-08 DIAGNOSIS — R0609 Other forms of dyspnea: Secondary | ICD-10-CM

## 2013-06-08 NOTE — Progress Notes (Signed)
[facility], [place], [place]  [address] Phone: [phone] Fax:  [phone]  Date:  06/08/2013   ID:  [name] [name], DOB Jul 27, 1930, MRN [id number]  PCP:  Alesia Richards, MD  Cardiologist:  Fransico Him, MD     History of Present Illness: [name] [name] is a 80 y.o. female with a history of HTN, dyslipidemia, obesity and DM who presents today for evaluation of SOB and abnormal chest xray in 9/14 showing emphysema and possible CHF.  She has noticed increasing DOE recently.  She says that the SOB is even with daily activities like making her bed.  She mainly notices it at the grocery store taking her groceries in.  She denies any chest pain.  She has some LE edema which is controlled with support hose.  She denies any palpitations, dizziness or syncope.     Wt Readings from Last 3 Encounters:  06/08/13 194 lb (87.998 kg)  05/03/13 192 lb (87.091 kg)  02/02/13 200 lb 12.8 oz (91.082 kg)     Past Medical History  Diagnosis Date  . Obesity   . Hyperlipidemia   . Hypertension   . Type II or unspecified type diabetes mellitus without mention of complication, not stated as uncontrolled   . Allergy   . Arthritis   . Gout   . Vitamin D deficiency     Current Outpatient Prescriptions  Medication Sig Dispense Refill  . acidophilus (RISAQUAD) CAPS Take 1 capsule by mouth every morning.      Marland Kitchen allopurinol (ZYLOPRIM) 300 MG tablet Take 150 mg by mouth every evening.       Marland Kitchen ALPRAZolam (XANAX) 1 MG tablet TAKE 1/2 TO 1 TABLET BY MOUTH EVERY DAY AT BEDTIME AS NEEDED FOR SLEEP  30 tablet  3  . aspirin EC 81 MG tablet Take 81 mg by mouth every evening.      Marland Kitchen atenolol (TENORMIN) 100 MG tablet Take 50 mg by mouth every evening.       . cholecalciferol (VITAMIN D) 1000 UNITS tablet Take 3,000 Units by mouth every morning.       Marland Kitchen doxazosin (CARDURA) 8 MG tablet Take 8 mg by mouth at bedtime.      . Flaxseed, Linseed, (FLAX SEED OIL) 1000 MG CAPS Take 1 capsule by mouth  every morning.      . furosemide (LASIX) 40 MG tablet TAKE 1/2 TO 1 TABLET EVERY MORNING FOR BLOOD PRESSURE/FLUID  90 tablet  2  . gabapentin (NEURONTIN) 300 MG capsule Take 300 mg by mouth at bedtime.      Marland Kitchen gemfibrozil (LOPID) 600 MG tablet TAKE 1 TABLET BY MOUTH TWICE DAILY WITH MEALS FOR CHOLESTEROL  180 tablet  8  . loratadine (CLARITIN) 10 MG tablet Take 10 mg by mouth every morning.      Marland Kitchen losartan (COZAAR) 100 MG tablet TAKE 1 TABLET BY MOUTH EVERY DAY  30 tablet  6  . omeprazole (PRILOSEC) 20 MG capsule TAKE ONE CAPSULE BY MOUTH EVERY MORNING FOR ACID INDIGESTION  90 capsule  3  . omeprazole (PRILOSEC) 40 MG capsule Take 40 mg by mouth every morning.       . sulfamethoxazole-trimethoprim (BACTRIM DS) 800-160 MG per tablet Take 1 tablet by mouth 2 (two) times daily.  14 tablet  0  . vitamin B-12 (CYANOCOBALAMIN) 1000 MCG tablet Take 1,000 mcg by mouth every morning.      . vitamin C (ASCORBIC ACID) 500 MG tablet Take 500  mg by mouth 3 (three) times daily.       No current facility-administered medications for this visit.    Allergies:    Allergies  Allergen Reactions  . Codeine   . Lisinopril Other (See Comments)    fatigue  . Tylenol Pm Extra [Diphenhydramine-Apap (Sleep)]     Social History:  The patient  reports that she has never smoked. She has never used smokeless tobacco. She reports that she does not drink alcohol or use illicit drugs.   Family History:  The patient's family history includes Asthma in her father and son; Diabetes in her brother; Heart attack in her father; Hyperlipidemia in her father; Hypertension in her father, mother, and son; Sleep apnea in her daughter; Stroke in her mother.   ROS:  Please see the history of present illness.      All other systems reviewed and negative.   PHYSICAL EXAM: VS:  BP 131/71  Pulse 65  Ht 5\' 4"  (1.626 m)  Wt 194 lb (87.998 kg)  BMI 33.28 kg/m2 Well nourished, well developed, in no acute distress HEENT: normal Neck:  no JVDleft carotid artery bruit Cardiac:  normal S1, S2; RRR; no murmur Lungs:  clear to auscultation bilaterally, no wheezing, rhonchi or rales Abd: soft, nontender, no hepatomegaly Ext: no edema Skin: warm and dry Neuro:  CNs 2-12 intact, no focal abnormalities noted  EKG:  NSR with LAFB     ASSESSMENT AND PLAN:  1. DOE with multiple CRF including HTN, DM, dyslipidemia and exposure to second hand smoke growing up.  Her EKG is nonischemic.   - Will check a 2D echo to assess LVF - check Lexiscan myoveiw to rule out ischemia 2. HTN well controlled 3. Abnormal CXRAY with ? CHF - check 2D echo 4.  Left carotid artery bruit - check carotid dopplers  Followup with me 1 week after studies are completed  Signed, Fransico Him, MD 06/08/2013 8:45 AM

## 2013-06-08 NOTE — Patient Instructions (Signed)
Your physician recommends that you continue on your current medications as directed. Please refer to the Current Medication list given to you today.  Your physician has requested that you have an echocardiogram. Echocardiography is a painless test that uses sound waves to create images of your heart. It provides your doctor with information about the size and shape of your heart and how well your heart's chambers and valves are working. This procedure takes approximately one hour. There are no restrictions for this procedure.  Your physician has requested that you have a lexiscan myoview. For further information please visit [email or web address]. Please follow instruction sheet, as given.  Your physician has requested that you have a carotid duplex. This test is an ultrasound of the carotid arteries in your neck. It looks at blood flow through these arteries that supply the brain with blood. Allow one hour for this exam. There are no restrictions or special instructions.  Your physician recommends that you schedule a follow-up appointment One week after all studies are complete

## 2013-06-18 ENCOUNTER — Institutional Professional Consult (permissible substitution): Payer: Medicare Other | Admitting: Cardiology

## 2013-06-23 ENCOUNTER — Ambulatory Visit (HOSPITAL_COMMUNITY): Payer: Medicare Other | Attending: Cardiology | Admitting: Radiology

## 2013-06-23 ENCOUNTER — Ambulatory Visit (HOSPITAL_BASED_OUTPATIENT_CLINIC_OR_DEPARTMENT_OTHER): Payer: Medicare Other | Admitting: Cardiology

## 2013-06-23 ENCOUNTER — Ambulatory Visit (HOSPITAL_BASED_OUTPATIENT_CLINIC_OR_DEPARTMENT_OTHER): Payer: Medicare Other | Admitting: Radiology

## 2013-06-23 VITALS — BP 181/78 | HR 72 | Ht 64.0 in | Wt 192.0 lb

## 2013-06-23 DIAGNOSIS — R0989 Other specified symptoms and signs involving the circulatory and respiratory systems: Secondary | ICD-10-CM

## 2013-06-23 DIAGNOSIS — R079 Chest pain, unspecified: Secondary | ICD-10-CM | POA: Diagnosis not present

## 2013-06-23 DIAGNOSIS — E119 Type 2 diabetes mellitus without complications: Secondary | ICD-10-CM | POA: Insufficient documentation

## 2013-06-23 DIAGNOSIS — I6529 Occlusion and stenosis of unspecified carotid artery: Secondary | ICD-10-CM | POA: Diagnosis not present

## 2013-06-23 DIAGNOSIS — I509 Heart failure, unspecified: Secondary | ICD-10-CM

## 2013-06-23 DIAGNOSIS — J449 Chronic obstructive pulmonary disease, unspecified: Secondary | ICD-10-CM | POA: Diagnosis not present

## 2013-06-23 DIAGNOSIS — R0602 Shortness of breath: Secondary | ICD-10-CM | POA: Diagnosis not present

## 2013-06-23 DIAGNOSIS — R0609 Other forms of dyspnea: Secondary | ICD-10-CM | POA: Diagnosis not present

## 2013-06-23 DIAGNOSIS — Z8249 Family history of ischemic heart disease and other diseases of the circulatory system: Secondary | ICD-10-CM | POA: Diagnosis not present

## 2013-06-23 DIAGNOSIS — I10 Essential (primary) hypertension: Secondary | ICD-10-CM | POA: Diagnosis not present

## 2013-06-23 DIAGNOSIS — J4489 Other specified chronic obstructive pulmonary disease: Secondary | ICD-10-CM | POA: Insufficient documentation

## 2013-06-23 MED ORDER — TECHNETIUM TC 99M SESTAMIBI GENERIC - CARDIOLITE
33.0000 | Freq: Once | INTRAVENOUS | Status: AC | PRN
  Administered 2013-06-23: 33 via INTRAVENOUS

## 2013-06-23 MED ORDER — REGADENOSON 0.4 MG/5ML IV SOLN
0.4000 mg | Freq: Once | INTRAVENOUS | Status: AC
  Administered 2013-06-23: 0.4 mg via INTRAVENOUS

## 2013-06-23 MED ORDER — TECHNETIUM TC 99M SESTAMIBI GENERIC - CARDIOLITE
11.0000 | Freq: Once | INTRAVENOUS | Status: AC | PRN
  Administered 2013-06-23: 11 via INTRAVENOUS

## 2013-06-23 NOTE — Progress Notes (Signed)
Echocardiogram performed.  

## 2013-06-23 NOTE — Progress Notes (Signed)
Carotid duplex complete 

## 2013-06-23 NOTE — Progress Notes (Signed)
[facility] SITE 3 NUCLEAR MED [address] [place], [place] [address] [phone]    Cardiology Nuclear Med Study  [name] [name] is a 80 y.o. female     MRN : [id number]     DOB: 1930-08-21  Procedure Date: 06/23/2013  Nuclear Med Background Indication for Stress Test:  Evaluation for Ischemia History:  No known CAD, Echo 2015 (pending), MPI ~40yrs ago ([place], [place]), emphysema, COPD Cardiac Risk Factors: Carotid Disease, Family History - CAD, Hypertension, Lipids and NIDDM  Symptoms:  Chest Pain (last date of chest discomfort was two weeks ago) and DOE   Nuclear Pre-Procedure Caffeine/Decaff Intake:  None>12 hrs NPO After: 7:00pm   Lungs:  clear O2 Sat: 97% on room air. IV 0.9% NS with Angio Cath:  22g  IV Site: R Hand x 1, tolerated well IV Started by:  Irven Baltimore, RN  Chest Size (in):  40 Cup Size: C  Height: 5\' 4"  (1.626 m)  Weight:  192 lb (87.091 kg)  BMI:  Body mass index is 32.94 kg/(m^2). Tech Comments:  Atenolol last night. Irven Baltimore, RN.    Nuclear Med Study 1 or 2 day study: 1 day  Stress Test Type:  Lexiscan  Reading MD: N/A  Order Authorizing Provider:  Fransico Him, MD  Resting Radionuclide: Technetium 74m Sestamibi  Resting Radionuclide Dose: 11.0 mCi   Stress Radionuclide:  Technetium 35m Sestamibi  Stress Radionuclide Dose: 33.0 mCi           Stress Protocol Rest HR: 72 Stress HR: 88  Rest BP: 181/78 Stress BP: 137/52  Exercise Time (min): n/a METS: n/a           Dose of Adenosine (mg):  n/a Dose of Lexiscan: 0.4 mg  Dose of Atropine (mg): n/a Dose of Dobutamine: n/a mcg/kg/min (at max HR)  Stress Test Technologist: Glade Lloyd, BS-ES  Nuclear Technologist:  Charlton Amor, CNMT     Rest Procedure:  Myocardial perfusion imaging was performed at rest 45 minutes following the intravenous administration of Technetium 28m Sestamibi. Rest ECG: LAFB and NSR  Stress Procedure:  The patient received IV Lexiscan 0.4 mg over  15-seconds.  Technetium 28m Sestamibi injected at 30-seconds.  Quantitative spect images were obtained after a 45 minute delay.  During the infusion of Lexiscan the patient complained of stomach feeling cold.  This resolved in recovery.  Stress ECG: No significant change from baseline ECG  QPS Raw Data Images:  Normal; no motion artifact; normal heart/lung ratio. Stress Images:  there is mildly reduced uptake in a small segment of the basal inferolateral wall Rest Images:  the inferolateral abnormality partially resolves. Subtraction (SDS):  These findings are consistent with ischemia. Transient Ischemic Dilatation (Normal <1.22): 0.98 Lung/Heart Ratio (Normal <0.45):  0.26  Quantitative Gated Spect Images QGS EDV:  89 ml QGS ESV:  26 ml  Impression Exercise Capacity:  Lexiscan with no exercise. BP Response:  Normal blood pressure response. Clinical Symptoms:  No significant symptoms noted. ECG Impression:  No significant ST segment change suggestive of ischemia. Comparison with Prior Nuclear Study: No previous nuclear study performed  Overall Impression:  Low risk stress nuclear study with a very small area of mild basal inferolateral wall ischemia.  LV Ejection Fraction: 71%.  LV Wall Motion:  NL LV Function; NL Wall Motion    Sanda Klein, MD, [facility] [phone] office [phone] pager

## 2013-06-30 ENCOUNTER — Ambulatory Visit (INDEPENDENT_AMBULATORY_CARE_PROVIDER_SITE_OTHER): Payer: Medicare Other | Admitting: Cardiology

## 2013-06-30 ENCOUNTER — Encounter: Payer: Self-pay | Admitting: Cardiology

## 2013-06-30 VITALS — BP 130/70 | HR 68 | Ht 64.0 in | Wt 194.0 lb

## 2013-06-30 DIAGNOSIS — I10 Essential (primary) hypertension: Secondary | ICD-10-CM | POA: Diagnosis not present

## 2013-06-30 DIAGNOSIS — I6529 Occlusion and stenosis of unspecified carotid artery: Secondary | ICD-10-CM

## 2013-06-30 DIAGNOSIS — I5032 Chronic diastolic (congestive) heart failure: Secondary | ICD-10-CM | POA: Diagnosis not present

## 2013-06-30 DIAGNOSIS — R0602 Shortness of breath: Secondary | ICD-10-CM

## 2013-06-30 LAB — BASIC METABOLIC PANEL
BUN: 43 mg/dL — AB (ref 6–23)
CHLORIDE: 105 meq/L (ref 96–112)
CO2: 22 mEq/L (ref 19–32)
Calcium: 9.9 mg/dL (ref 8.4–10.5)
Creatinine, Ser: 1.5 mg/dL — ABNORMAL HIGH (ref 0.4–1.2)
GFR: 35.12 mL/min — ABNORMAL LOW (ref >60.00)
GLUCOSE: 130 mg/dL — AB (ref 70–99)
POTASSIUM: 4 meq/L (ref 3.5–5.1)
Sodium: 138 mEq/L (ref 135–145)

## 2013-06-30 LAB — BRAIN NATRIURETIC PEPTIDE: Pro B Natriuretic peptide (BNP): 72 pg/mL (ref 0.0–100.0)

## 2013-06-30 NOTE — Progress Notes (Signed)
[place], [place], [place]  [address] Phone: [phone] Fax:  [phone]  Date:  06/30/2013   ID:  [name] [name], DOB Dec 06, 1930, MRN [id number]  PCP:  Alesia Richards, MD  Cardiologist:  Fransico Him, MD     History of Present Illness: [name] [name] is a 80 y.o. female with a history of HTN, dyslipidemia, obesity and DM who presents today for followup of SOB and abnormal chest xray on 9/14 showing emphysema and possible CHF. She had noticed increasing DOE recently. She said that the SOB was even with daily activities like making her bed. She mainly notices it at the grocery store taking her groceries in. She denies any chest pain. She has some LE edema which is controlled with support hose. She denies any palpitations, dizziness or syncope. 2D echo showed normal LVF with mild pulmonary HTN, moderate TR and diastolic dysfunction.  A carotid bruit was also noted on her last OV and carotid dopplers showed moderate left carotid artery stenosis.  Nuclear stress test was low risk with a very small area of mild basal inferolateral ischemia felt most likely to represent breast attenuation.  She now presents back for followup.  She says that her breathing has improved.  She was able to go to the grocery store yesterday and not get out of breath.       Wt Readings from Last 3 Encounters:  06/30/13 194 lb (87.998 kg)  06/23/13 192 lb (87.091 kg)  06/08/13 194 lb (87.998 kg)     Past Medical History  Diagnosis Date  . Obesity   . Hyperlipidemia   . Type II or unspecified type diabetes mellitus without mention of complication, not stated as uncontrolled   . Allergy   . Arthritis   . Gout   . Vitamin D deficiency   . Carotid artery stenosis     1-39% right and 40-59% left by dopplers 2015  . Chronic diastolic CHF (congestive heart failure), NYHA class 1   . Hypertension     Current Outpatient Prescriptions  Medication Sig Dispense Refill  . acidophilus (RISAQUAD)  CAPS Take 1 capsule by mouth every morning.      Marland Kitchen allopurinol (ZYLOPRIM) 300 MG tablet Take 150 mg by mouth every evening.       Marland Kitchen ALPRAZolam (XANAX) 1 MG tablet TAKE 1/2 TO 1 TABLET BY MOUTH EVERY DAY AT BEDTIME AS NEEDED FOR SLEEP  30 tablet  3  . aspirin EC 81 MG tablet Take 81 mg by mouth every evening.      Marland Kitchen atenolol (TENORMIN) 100 MG tablet Take 50 mg by mouth every evening.       . cholecalciferol (VITAMIN D) 1000 UNITS tablet Take 3,000 Units by mouth every morning.       Marland Kitchen doxazosin (CARDURA) 8 MG tablet Take 8 mg by mouth at bedtime.      . Flaxseed, Linseed, (FLAX SEED OIL) 1000 MG CAPS Take 1 capsule by mouth every morning.      . furosemide (LASIX) 40 MG tablet TAKE 1/2 TO 1 TABLET EVERY MORNING FOR BLOOD PRESSURE/FLUID  90 tablet  2  . gabapentin (NEURONTIN) 300 MG capsule Take 300 mg by mouth at bedtime.      Marland Kitchen gemfibrozil (LOPID) 600 MG tablet TAKE 1 TABLET BY MOUTH TWICE DAILY WITH MEALS FOR CHOLESTEROL  180 tablet  8  . loratadine (CLARITIN) 10 MG tablet Take 10 mg by mouth every morning.      Marland Kitchen  losartan (COZAAR) 100 MG tablet TAKE 1 TABLET BY MOUTH EVERY DAY  30 tablet  6  . omeprazole (PRILOSEC) 20 MG capsule TAKE ONE CAPSULE BY MOUTH EVERY MORNING FOR ACID INDIGESTION  90 capsule  3  . vitamin B-12 (CYANOCOBALAMIN) 1000 MCG tablet Take 1,000 mcg by mouth every morning.      . vitamin C (ASCORBIC ACID) 500 MG tablet Take 500 mg by mouth 3 (three) times daily.       No current facility-administered medications for this visit.    Allergies:    Allergies  Allergen Reactions  . Codeine   . Lisinopril Other (See Comments)    fatigue  . Tylenol Pm Extra [Diphenhydramine-Apap (Sleep)]     Social History:  The patient  reports that she has never smoked. She has never used smokeless tobacco. She reports that she does not drink alcohol or use illicit drugs.   Family History:  The patient's family history includes Asthma in her father and son; Diabetes in her brother; Heart  attack in her father; Hyperlipidemia in her father; Hypertension in her father, mother, and son; Sleep apnea in her daughter; Stroke in her mother.   ROS:  Please see the history of present illness.      All other systems reviewed and negative.   PHYSICAL EXAM: VS:  BP 130/70  Pulse 68  Ht 5\' 4"  (1.626 m)  Wt 194 lb (87.998 kg)  BMI 33.28 kg/m2 Well nourished, well developed, in no acute distress HEENT: normal Neck: no JVD Cardiac:  normal S1, S2; RRR; no murmur Lungs:  clear to auscultation bilaterally, no wheezing, rhonchi or rales Abd: soft, nontender, no hepatomegaly Ext: no edema Skin: warm and dry Neuro:  CNs 2-12 intact, no focal abnormalities noted  ASSESSMENT AND PLAN:  1. Chronic diastolic CHF Stage I -  2D echo with normal LVF and diastolic dysfunction.  Her nuclear stress test showed a very small area of reduced uptake in the basal inferolateral wall that most likely represents breast attenuation and is low risk. - continue Lasix  - check BNP today and if elevated will increase dose of Lasix - continue beta blocker 2. HTN well controlled - continue beta blocker/ARB/doxazosin 3. Left carotid artery bruit with  Moderate carotid artery stenosis on dopplers        - repeat carotid dopplers in 1 year        - continue ASA  Followup with me in 6 months  Signed, Fransico Him, MD 06/30/2013 9:10 AM

## 2013-06-30 NOTE — Patient Instructions (Signed)
Your physician recommends that you continue on your current medications as directed. Please refer to the Current Medication list given to you today.  Your physician recommends that you return for lab work today (BMET, BNP)  Your physician wants you to follow-up in: 6 months with Dr. Radford Pax.   You will receive a reminder letter in the mail two months in advance. If you don't receive a letter, please call our office to schedule the follow-up appointment.

## 2013-07-02 ENCOUNTER — Other Ambulatory Visit: Payer: Self-pay | Admitting: Internal Medicine

## 2013-07-15 ENCOUNTER — Other Ambulatory Visit: Payer: Self-pay | Admitting: Nephrology

## 2013-07-15 DIAGNOSIS — E118 Type 2 diabetes mellitus with unspecified complications: Secondary | ICD-10-CM | POA: Diagnosis not present

## 2013-07-15 DIAGNOSIS — I10 Essential (primary) hypertension: Secondary | ICD-10-CM | POA: Diagnosis not present

## 2013-07-15 DIAGNOSIS — N183 Chronic kidney disease, stage 3 unspecified: Secondary | ICD-10-CM

## 2013-07-15 DIAGNOSIS — E785 Hyperlipidemia, unspecified: Secondary | ICD-10-CM | POA: Diagnosis not present

## 2013-07-15 DIAGNOSIS — E559 Vitamin D deficiency, unspecified: Secondary | ICD-10-CM | POA: Diagnosis not present

## 2013-07-20 ENCOUNTER — Ambulatory Visit
Admission: RE | Admit: 2013-07-20 | Discharge: 2013-07-20 | Disposition: A | Discharge status: Home / Self Care | Payer: Medicare Other | Source: Ambulatory Visit | Attending: Nephrology | Admitting: Nephrology

## 2013-07-20 DIAGNOSIS — N183 Chronic kidney disease, stage 3 unspecified: Secondary | ICD-10-CM

## 2013-07-27 ENCOUNTER — Other Ambulatory Visit: Payer: Self-pay | Admitting: Internal Medicine

## 2013-08-02 ENCOUNTER — Ambulatory Visit: Payer: Self-pay | Admitting: Internal Medicine

## 2013-08-03 ENCOUNTER — Ambulatory Visit (INDEPENDENT_AMBULATORY_CARE_PROVIDER_SITE_OTHER): Payer: Medicare Other | Admitting: Internal Medicine

## 2013-08-03 ENCOUNTER — Encounter: Payer: Self-pay | Admitting: Internal Medicine

## 2013-08-03 VITALS — BP 134/72 | HR 66 | Temp 97.9°F | Resp 18 | Ht 64.0 in | Wt 198.4 lb

## 2013-08-03 DIAGNOSIS — Z79899 Other long term (current) drug therapy: Secondary | ICD-10-CM | POA: Diagnosis not present

## 2013-08-03 DIAGNOSIS — I150 Renovascular hypertension: Secondary | ICD-10-CM

## 2013-08-03 DIAGNOSIS — E559 Vitamin D deficiency, unspecified: Secondary | ICD-10-CM | POA: Diagnosis not present

## 2013-08-03 DIAGNOSIS — E782 Mixed hyperlipidemia: Secondary | ICD-10-CM

## 2013-08-03 DIAGNOSIS — N2889 Other specified disorders of kidney and ureter: Principal | ICD-10-CM

## 2013-08-03 DIAGNOSIS — I151 Hypertension secondary to other renal disorders: Secondary | ICD-10-CM

## 2013-08-03 DIAGNOSIS — E1129 Type 2 diabetes mellitus with other diabetic kidney complication: Secondary | ICD-10-CM | POA: Diagnosis not present

## 2013-08-03 LAB — CBC WITH DIFFERENTIAL/PLATELET
BASOS PCT: 0 % (ref 0–1)
Basophils Absolute: 0 10*3/uL (ref 0.0–0.1)
EOS ABS: 0.3 10*3/uL (ref 0.0–0.7)
Eosinophils Relative: 5 % (ref 0–5)
HEMATOCRIT: 31.5 % — AB (ref 36.0–46.0)
Hemoglobin: 11 g/dL — ABNORMAL LOW (ref 12.0–15.0)
LYMPHS ABS: 1.5 10*3/uL (ref 0.7–4.0)
Lymphocytes Relative: 26 % (ref 12–46)
MCH: 31.3 pg (ref 26.0–34.0)
MCHC: 34.9 g/dL (ref 30.0–36.0)
MCV: 89.7 fL (ref 78.0–100.0)
Monocytes Absolute: 0.6 10*3/uL (ref 0.1–1.0)
Monocytes Relative: 11 % (ref 3–12)
NEUTROS ABS: 3.4 10*3/uL (ref 1.7–7.7)
NEUTROS PCT: 58 % (ref 43–77)
Platelets: 187 10*3/uL (ref 150–400)
RBC: 3.51 MIL/uL — ABNORMAL LOW (ref 3.87–5.11)
RDW: 14.3 % (ref 11.5–15.5)
WBC: 5.8 10*3/uL (ref 4.0–10.5)

## 2013-08-03 LAB — HEMOGLOBIN A1C
HEMOGLOBIN A1C: 6.2 % — AB (ref <5.7)
MEAN PLASMA GLUCOSE: 131 mg/dL — AB (ref <117)

## 2013-08-03 NOTE — Patient Instructions (Signed)

## 2013-08-03 NOTE — Progress Notes (Signed)
Patient ID: [name] [name], female   DOB: 1930-10-06, 80 y.o.   MRN: [id number]   This very nice 80 y.o.WWF presents for 3 month follow up with Hypertension, CKD, Hyperlipidemia, Pre-Diabetes and Vitamin D Deficiency.    HTN predates since 89. BP has been controlled at home. Today's BP: 134/72 mmHg. Patient was recently seen by Dr [name] [name] for eval of acute/chronic dyspnea and cardiocyte stress test was negative an d 2 DEC was mildly abn for diastolic dysfunction and recc 1 yr f/u of mild/mod Lt Car stenosis.Patient denies any cardiac type chest pain, palpitations, dyspnea/orthopnea/PND, dizziness, claudication, or dependent edema.   Hyperlipidemia is controlled with diet & meds. Patient denies myalgias or other med SE's. Last Lipids were at goal as below.  Lab Results  Component Value Date   CHOL 150 05/03/2013   HDL 53 05/03/2013   LDLCALC 83 05/03/2013   TRIG 68 05/03/2013   CHOLHDL 2.8 05/03/2013    Also, the patient has history of T2_NIDDM  since 2003 and last A1c was  6.4% in Mar 2015 and patient also has multifactorial CKD - Stage 3 (GFR 33 ml/min) felt due to hypertensive nephrosclerosis & diabetic glomerulosclerosis.  Patient was also recently seen by Dr [name] [name] and he acknowledged her stage 3 CKD with no immediate recommendations. Patient denies any symptoms of reactive hypoglycemia, diabetic polys, paresthesias or visual blurring.   Further, Patient has history of Vitamin D Deficiency of 38 in 2010 and last vitamin D was 60 in Oct 2014.  Patient supplements vitamin D without any suspected side-effects.   Medication List   acidophilus Caps capsule  Take 1 capsule by mouth every morning.     allopurinol 300 MG tablet  Commonly known as:  ZYLOPRIM  TAKE 1 TABLET BY MOUTH ONCE DAILY TO PREVENT GOUT     ALPRAZolam 1 MG tablet  Commonly known as:  XANAX  TAKE 1/2 TO 1 TABLET BY MOUTH EVERY DAY AT BEDTIME AS NEEDED FOR SLEEP     aspirin EC 81 MG tablet  Take 81 mg by mouth  every evening.     atenolol 100 MG tablet  Commonly known as:  TENORMIN  Take 50 mg by mouth every evening.     cholecalciferol 1000 UNITS tablet  Commonly known as:  VITAMIN D  Take 3,000 Units by mouth every morning.     doxazosin 8 MG tablet  Commonly known as:  CARDURA  TAKE 1 TABLET BY MOUTH AT BEDTIME     Flax Seed Oil 1000 MG Caps  Take 1 capsule by mouth every morning.     furosemide 40 MG tablet  Commonly known as:  LASIX  TAKE 1 on Monday Wed and Friday     gabapentin 300 MG capsule  Commonly known as:  NEURONTIN  Take 300 mg by mouth at bedtime.     gemfibrozil 600 MG tablet  Commonly known as:  LOPID  TAKE 1 TABLET BY MOUTH TWICE DAILY WITH MEALS FOR CHOLESTEROL     loratadine 10 MG tablet  Commonly known as:  CLARITIN  Take 10 mg by mouth every morning.     losartan 100 MG tablet  Commonly known as:  COZAAR  TAKE 1 TABLET BY MOUTH EVERY DAY     omeprazole 20 MG capsule  Commonly known as:  PRILOSEC  TAKE ONE CAPSULE BY MOUTH EVERY MORNING FOR ACID INDIGESTION     vitamin B-12 1000 MCG tablet  Commonly known as:  CYANOCOBALAMIN  Take  1,000 mcg by mouth every morning.     vitamin C 500 MG tablet  Commonly known as:  ASCORBIC ACID  Take 500 mg by mouth 2 (two) times daily.        Allergies  Allergen Reactions  . Codeine   . Lisinopril Other (See Comments)    fatigue  . Tylenol Pm Extra [Diphenhydramine-Apap (Sleep)]    PMHx:   Past Medical History  Diagnosis Date  . Obesity   . Hyperlipidemia   . Type II or unspecified type diabetes mellitus without mention of complication, not stated as uncontrolled   . Allergy   . Arthritis   . Gout   . Vitamin D deficiency   . Carotid artery stenosis     1-39% right and 40-59% left by dopplers 2015  . Chronic diastolic CHF (congestive heart failure), NYHA class 1   . Hypertension    FHx:    Reviewed / unchanged  SHx:    Reviewed / unchanged  Systems Review:  Constitutional: Denies fever,  chills, wt changes, headaches, insomnia, fatigue, night sweats, change in appetite. Eyes: Denies redness, blurred vision, diplopia, discharge, itchy, watery eyes.  ENT: Denies discharge, congestion, post nasal drip, epistaxis, sore throat, earache, hearing loss, dental pain, tinnitus, vertigo, sinus pain, snoring.  CV: Denies chest pain, palpitations, irregular heartbeat, syncope, dyspnea, diaphoresis, orthopnea, PND, claudication or edema. Respiratory: denies cough, dyspnea, DOE, pleurisy, hoarseness, laryngitis, wheezing.  Gastrointestinal: Denies dysphagia, odynophagia, heartburn, reflux, water brash, abdominal pain or cramps, nausea, vomiting, bloating, diarrhea, constipation, hematemesis, melena, hematochezia  or hemorrhoids. Genitourinary: Denies dysuria, frequency, urgency, nocturia, hesitancy, discharge, hematuria or flank pain. Musculoskeletal: Denies arthralgias, myalgias, stiffness, jt. swelling, pain, limping or strain/sprain.  Skin: Denies pruritus, rash, hives, warts, acne, eczema or change in skin lesion(s). Neuro: No weakness, tremor, incoordination, spasms, paresthesia or pain. Psychiatric: Denies confusion, memory loss or sensory loss. Endo: Denies change in weight, skin or hair change.  Heme/Lymph: No excessive bleeding, bruising or enlarged lymph nodes.  Exam:  BP 134/72  P 66  T 97.9 F   R 18  Ht 5\' 4"    Wt 198 lb 6.4 oz   BMI 34.04 kg/m2  Appears well nourished and in no distress. Eyes: PERRLA, EOMs, conjunctiva no swelling or erythema. Sinuses: No frontal/maxillary tenderness ENT/Mouth: EAC's clear, TM's nl w/o erythema, bulging. Nares clear w/o erythema, swelling, exudates. Oropharynx clear without erythema or exudates. Oral hygiene is good. Tongue normal, non obstructing. Hearing intact.  Neck: Supple. Thyroid nl. Car 2+/2+ without bruits, nodes or JVD. Chest: Respirations nl with BS clear & equal w/o rales, rhonchi, wheezing or stridor.  Cor: Heart sounds  normal w/ regular rate and rhythm without sig. murmurs, gallops, clicks, or rubs. Peripheral pulses normal and equal  without edema.  Abdomen: Soft & bowel sounds normal. Non-tender w/o guarding, rebound, hernias, masses, or organomegaly.  Lymphatics: Unremarkable.  Musculoskeletal: Full ROM all peripheral extremities, joint stability, 5/5 strength, and normal gait.  Skin: Warm, dry without exposed rashes, lesions or ecchymosis apparent.  Neuro: Cranial nerves intact, reflexes equal bilaterally. Sensory-motor testing grossly intact. Tendon reflexes grossly intact.  Pysch: Alert & oriented x 3. Insight and judgement nl & appropriate. No ideations.  Assessment and Plan:  1.    Hypertension - Continue monitor blood pressure at home. Continue diet/meds same.  2. Hyperlipidemia - Continue diet/meds, exercise,& lifestyle modifications. Continue monitor periodic cholesterol/liver & renal functions   3. T2_NIDDM w/Stage 3 CKD - continue recommend prudent low glycemic diet,  weight control, regular exercise, diabetic monitoring and periodic eye exams.  4. Vitamin D Deficiency - Continue supplementation.  Recommended regular exercise, BP monitoring, weight control, and discussed med and SE's. Recommended labs to assess and monitor clinical status. Further disposition pending results of labs.

## 2013-08-04 LAB — HEPATIC FUNCTION PANEL
ALK PHOS: 88 U/L (ref 39–117)
ALT: 10 U/L (ref 0–35)
AST: 20 U/L (ref 0–37)
Albumin: 4 g/dL (ref 3.5–5.2)
BILIRUBIN DIRECT: 0.1 mg/dL (ref 0.0–0.3)
BILIRUBIN INDIRECT: 0.3 mg/dL (ref 0.2–1.2)
TOTAL PROTEIN: 6.5 g/dL (ref 6.0–8.3)
Total Bilirubin: 0.4 mg/dL (ref 0.2–1.2)

## 2013-08-04 LAB — BASIC METABOLIC PANEL WITH GFR
BUN: 61 mg/dL — AB (ref 6–23)
CO2: 21 meq/L (ref 19–32)
Calcium: 9.4 mg/dL (ref 8.4–10.5)
Chloride: 97 mEq/L (ref 96–112)
Creat: 1.49 mg/dL — ABNORMAL HIGH (ref 0.50–1.10)
GFR, Est African American: 38 mL/min — ABNORMAL LOW
GFR, Est Non African American: 33 mL/min — ABNORMAL LOW
Glucose, Bld: 110 mg/dL — ABNORMAL HIGH (ref 70–99)
POTASSIUM: 4.8 meq/L (ref 3.5–5.3)
SODIUM: 131 meq/L — AB (ref 135–145)

## 2013-08-04 LAB — LIPID PANEL
CHOL/HDL RATIO: 3 ratio
Cholesterol: 142 mg/dL (ref 0–200)
HDL: 47 mg/dL (ref >39)
LDL CALC: 68 mg/dL (ref 0–99)
Triglycerides: 133 mg/dL (ref <150)
VLDL: 27 mg/dL (ref 0–40)

## 2013-08-04 LAB — TSH: TSH: 2.037 u[IU]/mL (ref 0.350–4.500)

## 2013-08-04 LAB — VITAMIN D 25 HYDROXY (VIT D DEFICIENCY, FRACTURES): Vit D, 25-Hydroxy: 64 ng/mL (ref 30–89)

## 2013-08-04 LAB — URIC ACID: Uric Acid, Serum: 5.7 mg/dL (ref 2.4–7.0)

## 2013-08-04 LAB — MAGNESIUM: Magnesium: 2.2 mg/dL (ref 1.5–2.5)

## 2013-08-04 LAB — INSULIN, FASTING: INSULIN FASTING, SERUM: 38 u[IU]/mL — AB (ref 3–28)

## 2013-08-28 ENCOUNTER — Emergency Department (HOSPITAL_COMMUNITY)
Admission: EM | Admit: 2013-08-28 | Discharge: 2013-08-29 | Disposition: A | Discharge status: Home / Self Care | Payer: Medicare Other | Attending: Emergency Medicine | Admitting: Emergency Medicine

## 2013-08-28 ENCOUNTER — Encounter (HOSPITAL_COMMUNITY): Payer: Self-pay | Admitting: Emergency Medicine

## 2013-08-28 DIAGNOSIS — Z79899 Other long term (current) drug therapy: Secondary | ICD-10-CM | POA: Diagnosis not present

## 2013-08-28 DIAGNOSIS — I10 Essential (primary) hypertension: Secondary | ICD-10-CM | POA: Insufficient documentation

## 2013-08-28 DIAGNOSIS — R1013 Epigastric pain: Secondary | ICD-10-CM | POA: Insufficient documentation

## 2013-08-28 DIAGNOSIS — E785 Hyperlipidemia, unspecified: Secondary | ICD-10-CM | POA: Insufficient documentation

## 2013-08-28 DIAGNOSIS — R1012 Left upper quadrant pain: Secondary | ICD-10-CM | POA: Insufficient documentation

## 2013-08-28 DIAGNOSIS — R112 Nausea with vomiting, unspecified: Secondary | ICD-10-CM | POA: Diagnosis not present

## 2013-08-28 DIAGNOSIS — I5032 Chronic diastolic (congestive) heart failure: Secondary | ICD-10-CM | POA: Insufficient documentation

## 2013-08-28 DIAGNOSIS — D649 Anemia, unspecified: Secondary | ICD-10-CM | POA: Diagnosis not present

## 2013-08-28 DIAGNOSIS — E119 Type 2 diabetes mellitus without complications: Secondary | ICD-10-CM | POA: Insufficient documentation

## 2013-08-28 DIAGNOSIS — R197 Diarrhea, unspecified: Secondary | ICD-10-CM | POA: Insufficient documentation

## 2013-08-28 DIAGNOSIS — E559 Vitamin D deficiency, unspecified: Secondary | ICD-10-CM | POA: Insufficient documentation

## 2013-08-28 DIAGNOSIS — E669 Obesity, unspecified: Secondary | ICD-10-CM | POA: Diagnosis not present

## 2013-08-28 DIAGNOSIS — M129 Arthropathy, unspecified: Secondary | ICD-10-CM | POA: Diagnosis not present

## 2013-08-28 LAB — CBC WITH DIFFERENTIAL/PLATELET
BASOS ABS: 0 10*3/uL (ref 0.0–0.1)
Basophils Relative: 0 % (ref 0–1)
EOS ABS: 0.2 10*3/uL (ref 0.0–0.7)
EOS PCT: 1 % (ref 0–5)
HCT: 26.3 % — ABNORMAL LOW (ref 36.0–46.0)
Hemoglobin: 8.8 g/dL — ABNORMAL LOW (ref 12.0–15.0)
Lymphocytes Relative: 5 % — ABNORMAL LOW (ref 12–46)
Lymphs Abs: 0.9 10*3/uL (ref 0.7–4.0)
MCH: 31.7 pg (ref 26.0–34.0)
MCHC: 33.5 g/dL (ref 30.0–36.0)
MCV: 94.6 fL (ref 78.0–100.0)
Monocytes Absolute: 1.9 10*3/uL — ABNORMAL HIGH (ref 0.1–1.0)
Monocytes Relative: 11 % (ref 3–12)
NEUTROS PCT: 83 % — AB (ref 43–77)
Neutro Abs: 14.2 10*3/uL — ABNORMAL HIGH (ref 1.7–7.7)
PLATELETS: 287 10*3/uL (ref 150–400)
RBC: 2.78 MIL/uL — ABNORMAL LOW (ref 3.87–5.11)
RDW: 13.7 % (ref 11.5–15.5)
WBC: 17.2 10*3/uL — ABNORMAL HIGH (ref 4.0–10.5)

## 2013-08-28 LAB — COMPREHENSIVE METABOLIC PANEL
ALT: 13 U/L (ref 0–35)
AST: 23 U/L (ref 0–37)
Albumin: 4.5 g/dL (ref 3.5–5.2)
Alkaline Phosphatase: 115 U/L (ref 39–117)
Anion gap: 16 — ABNORMAL HIGH (ref 5–15)
BUN: 46 mg/dL — ABNORMAL HIGH (ref 6–23)
CO2: 23 mEq/L (ref 19–32)
Calcium: 10 mg/dL (ref 8.4–10.5)
Chloride: 98 mEq/L (ref 96–112)
Creatinine, Ser: 1.47 mg/dL — ABNORMAL HIGH (ref 0.50–1.10)
GFR calc non Af Amer: 32 mL/min — ABNORMAL LOW (ref >90)
GFR, EST AFRICAN AMERICAN: 37 mL/min — AB (ref >90)
Glucose, Bld: 157 mg/dL — ABNORMAL HIGH (ref 70–99)
Potassium: 5.1 mEq/L (ref 3.7–5.3)
SODIUM: 137 meq/L (ref 137–147)
TOTAL PROTEIN: 7.9 g/dL (ref 6.0–8.3)
Total Bilirubin: 0.3 mg/dL (ref 0.3–1.2)

## 2013-08-28 LAB — LIPASE, BLOOD: Lipase: 87 U/L — ABNORMAL HIGH (ref 11–59)

## 2013-08-28 LAB — URINALYSIS, ROUTINE W REFLEX MICROSCOPIC
Bilirubin Urine: NEGATIVE
GLUCOSE, UA: NEGATIVE mg/dL
Hgb urine dipstick: NEGATIVE
Ketones, ur: NEGATIVE mg/dL
Nitrite: NEGATIVE
Protein, ur: NEGATIVE mg/dL
SPECIFIC GRAVITY, URINE: 1.012 (ref 1.005–1.030)
UROBILINOGEN UA: 0.2 mg/dL (ref 0.0–1.0)
pH: 6 (ref 5.0–8.0)

## 2013-08-28 LAB — URINE MICROSCOPIC-ADD ON

## 2013-08-28 MED ORDER — SODIUM CHLORIDE 0.9 % IV BOLUS (SEPSIS)
1000.0000 mL | Freq: Once | INTRAVENOUS | Status: AC
  Administered 2013-08-28: 1000 mL via INTRAVENOUS

## 2013-08-28 MED ORDER — ONDANSETRON HCL 4 MG/2ML IJ SOLN
4.0000 mg | Freq: Once | INTRAMUSCULAR | Status: AC
  Administered 2013-08-28: 4 mg via INTRAVENOUS
  Filled 2013-08-28: qty 2

## 2013-08-28 NOTE — ED Notes (Signed)
Pt has been told that a urine sample is needed from her, states that she doesn't have to go right now

## 2013-08-28 NOTE — ED Notes (Signed)
Pt presents with vomiting and accompanying diarrhea x 3 onset this evening @1700 .

## 2013-08-28 NOTE — ED Provider Notes (Signed)
CSN: [id number]     Arrival date & time 08/28/13  2121 History   First MD Initiated Contact with Patient 08/28/13 2134     Chief Complaint  Patient presents with  . Emesis  . Diarrhea     (Consider location/radiation/quality/duration/timing/severity/associated sxs/prior Treatment) HPI Comments: Patient presents with complaint of acute onset of nausea and vomiting and diarrhea that began approximately 6:30 PM. Patient began feeling nauseous and had several episodes of vomiting and diarrhea. No diarrhea is nonbloody. No fever. Patient had some mild upper abdominal soreness but no pain. No urinary symptoms. Patient was in normal state of health prior to symptoms today. No known sick contacts with similar symptoms. Patient does not have a history of arrhythmia or atrial fibrillation. She does have some moderate plaques in her carotid arteries but no other known PAD. She tried taking an Imodium tablet but vomited shortly afterwards. No previous abdominal surgeries. No other treatments prior to arrival.  Patient is a 80 y.o. female presenting with vomiting and diarrhea. The history is provided by the patient and a relative.  Emesis Associated symptoms: diarrhea   Associated symptoms: no abdominal pain, no headaches, no myalgias and no sore throat   Diarrhea Associated symptoms: vomiting   Associated symptoms: no abdominal pain, no fever, no headaches and no myalgias     Past Medical History  Diagnosis Date  . Obesity   . Hyperlipidemia   . Type II or unspecified type diabetes mellitus without mention of complication, not stated as uncontrolled   . Allergy   . Arthritis   . Gout   . Vitamin D deficiency   . Carotid artery stenosis     1-39% right and 40-59% left by dopplers 2015  . Chronic diastolic CHF (congestive heart failure), NYHA class 1   . Hypertension    Past Surgical History  Procedure Laterality Date  . Joint replacement    . Dilitation and currage     Family History   Problem Relation Age of Onset  . Hypertension Mother   . Stroke Mother   . Asthma Father   . Hypertension Father   . Hyperlipidemia Father   . Heart attack Father   . Diabetes Brother   . Sleep apnea Daughter   . Asthma Son   . Hypertension Son    History  Substance Use Topics  . Smoking status: Never Smoker   . Smokeless tobacco: Never Used  . Alcohol Use: No   OB History   Grav Para Term Preterm Abortions TAB SAB Ect Mult Living                 Review of Systems  Constitutional: Negative for fever.  HENT: Negative for rhinorrhea and sore throat.   Eyes: Negative for redness.  Respiratory: Negative for cough.   Cardiovascular: Negative for chest pain.  Gastrointestinal: Positive for nausea, vomiting and diarrhea. Negative for abdominal pain and blood in stool.  Genitourinary: Negative for dysuria.  Musculoskeletal: Negative for myalgias.  Skin: Negative for rash.  Neurological: Negative for headaches.    Allergies  Codeine; Lisinopril; and Tylenol pm extra  Home Medications   Prior to Admission medications   Medication Sig Start Date End Date Taking? Authorizing Provider  acidophilus (RISAQUAD) CAPS Take 1 capsule by mouth every morning.    Historical Provider, MD  allopurinol (ZYLOPRIM) 300 MG tablet TAKE 1 TABLET BY MOUTH ONCE DAILY TO PREVENT GOUT 07/27/13   Unk Pinto, MD  ALPRAZolam Duanne Moron) 1 MG tablet  TAKE 1/2 TO 1 TABLET BY MOUTH EVERY DAY AT BEDTIME AS NEEDED FOR SLEEP 05/27/13   Melissa R Tamala Julian, PA-C  aspirin EC 81 MG tablet Take 81 mg by mouth every evening.    Historical Provider, MD  atenolol (TENORMIN) 100 MG tablet Take 50 mg by mouth every evening.     Historical Provider, MD  cholecalciferol (VITAMIN D) 1000 UNITS tablet Take 3,000 Units by mouth every morning.     Historical Provider, MD  doxazosin (CARDURA) 8 MG tablet TAKE 1 TABLET BY MOUTH AT BEDTIME 07/02/13   Melissa R Smith, PA-C  Flaxseed, Linseed, (FLAX SEED OIL) 1000 MG CAPS Take 1  capsule by mouth every morning.    Historical Provider, MD  furosemide (LASIX) 40 MG tablet TAKE 1 on Monday Wed and Friday    Unk Pinto, MD  gabapentin (NEURONTIN) 300 MG capsule Take 300 mg by mouth at bedtime.    Historical Provider, MD  gemfibrozil (LOPID) 600 MG tablet TAKE 1 TABLET BY MOUTH TWICE DAILY WITH MEALS FOR CHOLESTEROL    Unk Pinto, MD  loratadine (CLARITIN) 10 MG tablet Take 10 mg by mouth every morning.    Historical Provider, MD  losartan (COZAAR) 100 MG tablet TAKE 1 TABLET BY MOUTH EVERY DAY 05/27/13   Unk Pinto, MD  omeprazole (PRILOSEC) 20 MG capsule TAKE ONE CAPSULE BY MOUTH EVERY MORNING FOR ACID INDIGESTION 05/27/13   Melissa R Smith, PA-C  vitamin B-12 (CYANOCOBALAMIN) 1000 MCG tablet Take 1,000 mcg by mouth every morning.    Historical Provider, MD  vitamin C (ASCORBIC ACID) 500 MG tablet Take 500 mg by mouth 2 (two) times daily.     Historical Provider, MD   BP 175/57  Pulse 85  Temp(Src) 98.3 F (36.8 C) (Oral)  Resp 20  Wt 193 lb (87.544 kg)  SpO2 97%  Physical Exam  Nursing note and vitals reviewed. Constitutional: She appears well-developed and well-nourished.  HENT:  Head: Normocephalic and atraumatic.  Eyes: Conjunctivae are normal. Right eye exhibits no discharge. Left eye exhibits no discharge.  Neck: Normal range of motion. Neck supple.  Cardiovascular: Normal rate, regular rhythm and normal heart sounds.   No murmur heard. Pulmonary/Chest: Effort normal and breath sounds normal. No respiratory distress. She has no wheezes. She has no rales.  Abdominal: Soft. Bowel sounds are normal. She exhibits no distension. There is tenderness (mild, epigastric/LUQ). There is no rebound and no guarding.  Neurological: She is alert.  Skin: Skin is warm and dry.  Psychiatric: She has a normal mood and affect.    ED Course  Procedures (including critical care time) Labs Review Labs Reviewed  CBC WITH DIFFERENTIAL - Abnormal; Notable for the  following:    WBC 17.2 (*)    RBC 2.78 (*)    Hemoglobin 8.8 (*)    HCT 26.3 (*)    Neutrophils Relative % 83 (*)    Neutro Abs 14.2 (*)    Lymphocytes Relative 5 (*)    Monocytes Absolute 1.9 (*)    All other components within normal limits  COMPREHENSIVE METABOLIC PANEL - Abnormal; Notable for the following:    Glucose, Bld 157 (*)    BUN 46 (*)    Creatinine, Ser 1.47 (*)    GFR calc non Af Amer 32 (*)    GFR calc Af Amer 37 (*)    Anion gap 16 (*)    All other components within normal limits  URINALYSIS, ROUTINE W REFLEX MICROSCOPIC - Abnormal;  Notable for the following:    Leukocytes, UA SMALL (*)    All other components within normal limits  LIPASE, BLOOD - Abnormal; Notable for the following:    Lipase 87 (*)    All other components within normal limits  URINE MICROSCOPIC-ADD ON - Abnormal; Notable for the following:    Squamous Epithelial / LPF FEW (*)    All other components within normal limits    Imaging Review No results found.   EKG Interpretation None      9:53 PM Patient seen and examined. Work-up initiated. Medications ordered. Patient was discussed with Mirna Mires, MD   Vital signs reviewed and are as follows: Filed Vitals:   08/28/13 2126  BP: 175/57  Pulse: 85  Temp: 98.3 F (36.8 C)  Resp: 20   Pt seen by Dr. Ashok Cordia. All labs and plan reviewed with him. He agrees this is gastroenteritis picture and if this remains stable, has minimal pain, and pt tolerates fluids, she can be discharged home with PCP follow-up.   12:37 AM Patient is doing well. Abdomen is soft and non-tender on exam. No vomiting and she is tolerating water.  I discussed lab results with patient and daughter at bedside.  Informed of worsening anemia. Patient does not report blood in stool. Feel that she can followup with PCP for recheck.  Mild elevation in lipase however no clinical signs of pancreatitis.  No signs of urinary tract infection. Leukocytosis as expected  with viral gastroenteritis. Baseline renal function noted. Mild hyperglycemia noted, also not on expected.  Patient counseled on clear liquid diet for the next 24 hours and counseled on b.r.a.t. diet. The patient was urged to return to the Emergency Department immediately with worsening of current symptoms, worsening abdominal pain, persistent vomiting, blood noted in stools, fever, or any other concerns. The patient and daughter verbalized understanding.    MDM   Final diagnoses:  Nausea vomiting and diarrhea  Anemia, unspecified anemia type   Patient with symptoms consistent with viral gastroenteritis. She does not have abdominal pain. Vitals are stable, no fever. No signs of dehydration, tolerating PO's. Lungs are clear. No focal abdominal pain, no concern for appendicitis, cholecystitis, pancreatitis, ruptured viscus, UTI, kidney stone, or any other dangerous abdominal etiology. Doubt mesenteric ischemia without pain. Supportive therapy indicated with return if symptoms worsen. Patient counseled.   Acute on chronic anemia. Pt denies dark stools. Without abd pain would doubt colitis, mesenteric ischemia, diverticulitis. There is no life-threatening bleeding on exam. This is likely unrelated to present illness and will need to be rechecked by PCP.   No dangerous or life-threatening conditions suspected or identified by history, physical exam, and by work-up. No indications for hospitalization identified.       Carlisle Cater, PA-C 08/29/13 [phone]

## 2013-08-28 NOTE — ED Notes (Signed)
Pt denies any recent antibiotic use or changes to her diet.  Pt denies pain except while vomiting.  Pt attempted to take imodium at home however vomited shortly after taking it.

## 2013-08-29 MED ORDER — ONDANSETRON 4 MG PO TBDP: 4.0000 mg | ORAL_TABLET | Freq: Three times a day (TID) | ORAL | Status: DC | PRN

## 2013-08-29 NOTE — Discharge Instructions (Signed)
Please read and follow all provided instructions.  Your diagnoses today include:  1. Nausea vomiting and diarrhea   2. Anemia, unspecified anemia type     Tests performed today include:  Blood counts and electrolytes - anemia with hemoglobin of 8.8, needs rechecked by your doctor  Blood tests to check liver and kidney function - baseline   Blood tests to check pancreas function - slightly high   Urine test to look for infection - no infection  Vital signs. See below for your results today.  Medications prescribed:   Zofran (ondansetron) - for nausea and vomiting  Take any prescribed medications only as directed.  Home care instructions:   Follow any educational materials contained in this packet.   Your abdominal pain, nausea, vomiting, and diarrhea may be caused by a viral gastroenteritis also called 'stomach flu'. You should rest for the next several days. Keep drinking plenty of fluids and use the medicine for nausea as directed.    Drink clear liquids for the next 24 hours and introduce solid foods slowly after 24 hours using the b.r.a.t. diet (Bananas, Rice, Applesauce, Toast, Yogurt).    Follow-up instructions: Please follow-up with your primary care provider in the next 2 days for further evaluation of your symptoms. If you are not feeling better in 48 hours you may have a condition that is more serious and you need re-evaluation.   Return instructions:  SEEK IMMEDIATE MEDICAL ATTENTION IF:  If you have pain that does not go away or becomes severe   A temperature above 101F develops   Repeated vomiting occurs (multiple episodes)   If you have pain that becomes localized to portions of the abdomen. The right side could possibly be appendicitis. In an adult, the left lower portion of the abdomen could be colitis or diverticulitis.   Blood is being passed in stools or vomit (bright red or black tarry stools)   You develop chest pain, difficulty breathing,  dizziness or fainting, or become confused, poorly responsive, or inconsolable (young children)  If you have any other emergent concerns regarding your health  Additional Information: Abdominal (belly) pain can be caused by many things. Your caregiver performed an examination and possibly ordered blood/urine tests and imaging (CT scan, x-rays, ultrasound). Many cases can be observed and treated at home after initial evaluation in the emergency department. Even though you are being discharged home, abdominal pain can be unpredictable. Therefore, you need a repeated exam if your pain does not resolve, returns, or worsens. Most patients with abdominal pain don't have to be admitted to the hospital or have surgery, but serious problems like appendicitis and gallbladder attacks can start out as nonspecific pain. Many abdominal conditions cannot be diagnosed in one visit, so follow-up evaluations are very important.  Your vital signs today were: BP 167/53   Pulse 67   Temp(Src) 98.3 F (36.8 C) (Oral)   Resp 20   Wt 193 lb (87.544 kg)   SpO2 97% If your blood pressure (bp) was elevated above 135/85 this visit, please have this repeated by your doctor within one month. --------------

## 2013-08-30 NOTE — ED Provider Notes (Signed)
Medical screening examination/treatment/procedure(s) were conducted as a shared visit with non-physician practitioner(s) and myself.  I personally evaluated the patient during the encounter.  Pt with acute onset 3-4 episodes of nvd. No recent abx use, no known ill contacts or bad food ingestion. No travel. Denies abd pain. No fever or chills. abd soft nt. Labs, ivf.    Mirna Mires, MD 08/30/13 [phone]

## 2013-08-31 ENCOUNTER — Ambulatory Visit (INDEPENDENT_AMBULATORY_CARE_PROVIDER_SITE_OTHER): Payer: Medicare Other | Admitting: Physician Assistant

## 2013-08-31 ENCOUNTER — Encounter: Payer: Self-pay | Admitting: Physician Assistant

## 2013-08-31 VITALS — BP 152/80 | HR 72 | Temp 98.6°F | Resp 16 | Wt 193.0 lb

## 2013-08-31 DIAGNOSIS — D649 Anemia, unspecified: Secondary | ICD-10-CM

## 2013-08-31 DIAGNOSIS — R1013 Epigastric pain: Secondary | ICD-10-CM

## 2013-08-31 DIAGNOSIS — E538 Deficiency of other specified B group vitamins: Secondary | ICD-10-CM | POA: Diagnosis not present

## 2013-08-31 LAB — CBC WITH DIFFERENTIAL/PLATELET
BASOS ABS: 0.1 10*3/uL (ref 0.0–0.1)
Basophils Relative: 1 % (ref 0–1)
EOS ABS: 0.3 10*3/uL (ref 0.0–0.7)
Eosinophils Relative: 4 % (ref 0–5)
HCT: 33.1 % — ABNORMAL LOW (ref 36.0–46.0)
Hemoglobin: 11.9 g/dL — ABNORMAL LOW (ref 12.0–15.0)
LYMPHS PCT: 23 % (ref 12–46)
Lymphs Abs: 1.5 10*3/uL (ref 0.7–4.0)
MCH: 32.4 pg (ref 26.0–34.0)
MCHC: 36 g/dL (ref 30.0–36.0)
MCV: 90.2 fL (ref 78.0–100.0)
Monocytes Absolute: 0.5 10*3/uL (ref 0.1–1.0)
Monocytes Relative: 8 % (ref 3–12)
Neutro Abs: 4.2 10*3/uL (ref 1.7–7.7)
Neutrophils Relative %: 64 % (ref 43–77)
PLATELETS: 232 10*3/uL (ref 150–400)
RBC: 3.67 MIL/uL — ABNORMAL LOW (ref 3.87–5.11)
RDW: 14.1 % (ref 11.5–15.5)
WBC: 6.5 10*3/uL (ref 4.0–10.5)

## 2013-08-31 LAB — RETICULOCYTES
ABS Retic: 44 10*3/uL (ref 19.0–186.0)
RBC.: 3.67 MIL/uL — AB (ref 3.87–5.11)
Retic Ct Pct: 1.2 % (ref 0.4–2.3)

## 2013-08-31 LAB — BASIC METABOLIC PANEL WITH GFR
BUN: 35 mg/dL — ABNORMAL HIGH (ref 6–23)
CALCIUM: 9.7 mg/dL (ref 8.4–10.5)
CO2: 22 mEq/L (ref 19–32)
CREATININE: 1.31 mg/dL — AB (ref 0.50–1.10)
Chloride: 102 mEq/L (ref 96–112)
GFR, EST NON AFRICAN AMERICAN: 38 mL/min — AB
GFR, Est African American: 44 mL/min — ABNORMAL LOW
Glucose, Bld: 106 mg/dL — ABNORMAL HIGH (ref 70–99)
Potassium: 4.8 mEq/L (ref 3.5–5.3)
Sodium: 133 mEq/L — ABNORMAL LOW (ref 135–145)

## 2013-08-31 LAB — IRON AND TIBC
%SAT: 24 % (ref 20–55)
IRON: 101 ug/dL (ref 42–145)
TIBC: 421 ug/dL (ref 250–470)
UIBC: 320 ug/dL (ref 125–400)

## 2013-08-31 LAB — HEPATIC FUNCTION PANEL
ALBUMIN: 4.3 g/dL (ref 3.5–5.2)
ALT: 11 U/L (ref 0–35)
AST: 19 U/L (ref 0–37)
Alkaline Phosphatase: 81 U/L (ref 39–117)
BILIRUBIN TOTAL: 0.4 mg/dL (ref 0.2–1.2)
Bilirubin, Direct: 0.1 mg/dL (ref 0.0–0.3)
Indirect Bilirubin: 0.3 mg/dL (ref 0.2–1.2)
Total Protein: 6.7 g/dL (ref 6.0–8.3)

## 2013-08-31 LAB — FERRITIN: Ferritin: 95 ng/mL (ref 10–291)

## 2013-08-31 LAB — AMYLASE: Amylase: 44 U/L (ref 0–105)

## 2013-08-31 MED ORDER — RANITIDINE HCL 300 MG PO TABS: 300.0000 mg | ORAL_TABLET | Freq: Two times a day (BID) | ORAL | Status: DC

## 2013-08-31 NOTE — Patient Instructions (Signed)
Stop Vitamin C, this can contribute to heart burn/GERD.  Can get multivitamin and stop Mag and B12 if it is in it. Continue Vitamin D  Continue the prilosec for 1 week with the Zantac, then switch to zantac 2 x a day for 4 weeks, then you can stay on twice a day or switch to once at night.  Avoid alcohol, spicy foods, NSAIDS (aleve, ibuprofen) at this time. See foods below.   Food Choices for Gastroesophageal Reflux Disease When you have gastroesophageal reflux disease (GERD), the foods you eat and your eating habits are very important. Choosing the right foods can help ease the discomfort of GERD. WHAT GENERAL GUIDELINES DO I NEED TO FOLLOW?  Choose fruits, vegetables, whole grains, low-fat dairy products, and low-fat meat, fish, and poultry.  Limit fats such as oils, salad dressings, butter, nuts, and avocado.  Keep a food diary to identify foods that cause symptoms.  Avoid foods that cause reflux. These may be different for different people.  Eat frequent small meals instead of three large meals each day.  Eat your meals slowly, in a relaxed setting.  Limit fried foods.  Cook foods using methods other than frying.  Avoid drinking alcohol.  Avoid drinking large amounts of liquids with your meals.  Avoid bending over or lying down until 2-3 hours after eating. WHAT FOODS ARE NOT RECOMMENDED? The following are some foods and drinks that may worsen your symptoms: Vegetables Tomatoes. Tomato juice. Tomato and spaghetti sauce. Chili peppers. Onion and garlic. Horseradish. Fruits Oranges, grapefruit, and lemon (fruit and juice). Meats High-fat meats, fish, and poultry. This includes hot dogs, ribs, ham, sausage, salami, and bacon. Dairy Whole milk and chocolate milk. Sour cream. Cream. Butter. Ice cream. Cream cheese.  Beverages Coffee and tea, with or without caffeine. Carbonated beverages or energy drinks. Condiments Hot sauce. Barbecue sauce.  Sweets/Desserts Chocolate and  cocoa. Donuts. Peppermint and spearmint. Fats and Oils High-fat foods, including [place] fries and potato chips. Other Vinegar. Strong spices, such as black pepper, white pepper, red pepper, cayenne, curry powder, cloves, ginger, and chili powder.

## 2013-08-31 NOTE — Progress Notes (Signed)
Subjective:    Patient ID: [name] [name], female    DOB: 16-Aug-1930, 80 y.o.   MRN: [id number]  HPI 80 y.o. female with history of HTN, CAD, CHF, NIDDM with stage 3 CKD presents after ER follow up. She went to the ER 08/28/2013 for nausea, diarrhea, thought to be gastroenteritis, given fluids and medications for nausea, zofran but was not able to get it due to cost. She did take Levsin yesterday at 6 which helped. She had all liquids Sunday, she had toast and soup yesterday. She had some mild RLQ pain yesterday. She was also found to have anemia which was a signiifcant drop from previous H/H. She has chronic thoracic pain between her shoulders, some water brash but denies GERD, melena, BRBPR. Last colonoscopy was 2006. BP has been slightly elevated.   CBC Latest Ref Rng 08/28/2013 08/03/2013 05/03/2013  WBC 4.0 - 10.5 K/uL 17.2(H) 5.8 6.2  Hemoglobin 12.0 - 15.0 g/dL 8.8(L) 11.0(L) 12.6  Hematocrit 36.0 - 46.0 % 26.3(L) 31.5(L) 37.8  Platelets 150 - 400 K/uL 287 187 200   Lab Results  Component Value Date   CREATININE 1.47* 08/28/2013   CREATININE 1.49* 08/03/2013   CREATININE 1.5* 06/30/2013   Blood pressure 152/80, pulse 72, temperature 98.6 F (37 C), resp. rate 16, weight 193 lb (87.544 kg).  Current Outpatient Prescriptions on File Prior to Visit  Medication Sig Dispense Refill  . acidophilus (RISAQUAD) CAPS Take 1 capsule by mouth every morning.      Marland Kitchen allopurinol (ZYLOPRIM) 300 MG tablet Take 150 mg by mouth at bedtime.      . ALPRAZolam (XANAX) 1 MG tablet Take 0.5-1 mg by mouth at bedtime as needed for anxiety or sleep.      Marland Kitchen aspirin EC 81 MG tablet Take 81 mg by mouth every evening.      Marland Kitchen atenolol (TENORMIN) 100 MG tablet Take 50 mg by mouth every evening.       . cholecalciferol (VITAMIN D) 1000 UNITS tablet Take 3,000 Units by mouth every morning.       Marland Kitchen doxazosin (CARDURA) 8 MG tablet Take 4 mg by mouth at bedtime.      . Flaxseed, Linseed, (FLAX SEED OIL) 1000 MG CAPS  Take 1 capsule by mouth every morning.      . furosemide (LASIX) 40 MG tablet TAKE 1 on Monday Wed and Friday      . gabapentin (NEURONTIN) 300 MG capsule Take 300 mg by mouth at bedtime.      Marland Kitchen gemfibrozil (LOPID) 600 MG tablet Take 600 mg by mouth every evening.      . loperamide (IMODIUM A-D) 2 MG tablet Take 2 mg by mouth 4 (four) times daily as needed for diarrhea or loose stools.      Marland Kitchen loratadine (CLARITIN) 10 MG tablet Take 10 mg by mouth every morning.      Marland Kitchen losartan (COZAAR) 100 MG tablet Take 100 mg by mouth daily.      . Magnesium 200 MG TABS Take 400 mg by mouth daily.      Marland Kitchen omeprazole (PRILOSEC) 20 MG capsule Take 20 mg by mouth every morning.      . ondansetron (ZOFRAN ODT) 4 MG disintegrating tablet Take 1 tablet (4 mg total) by mouth every 8 (eight) hours as needed for nausea or vomiting.  10 tablet  0  . vitamin B-12 (CYANOCOBALAMIN) 1000 MCG tablet Take 1,000 mcg by mouth every morning.      Marland Kitchen  vitamin C (ASCORBIC ACID) 500 MG tablet Take 500 mg by mouth 2 (two) times daily.        No current facility-administered medications on file prior to visit.    Review of Systems  Constitutional: Positive for fatigue. Negative for fever, chills, diaphoresis, activity change, appetite change and unexpected weight change.  HENT: Negative.   Eyes: Negative.   Respiratory: Negative.   Cardiovascular: Negative.   Gastrointestinal: Positive for nausea, abdominal pain and diarrhea. Negative for vomiting, constipation, blood in stool, anal bleeding and rectal pain.  Genitourinary: Negative.   Musculoskeletal: Negative.   Skin: Negative.   Neurological: Negative.   Psychiatric/Behavioral: Negative.        Objective:   Physical Exam  Constitutional: She is oriented to person, place, and time. She appears well-developed and well-nourished.  HENT:  Head: Normocephalic and atraumatic.  Eyes: Conjunctivae are normal. Pupils are equal, round, and reactive to light.  Neck: Normal range  of motion. Neck supple.  Cardiovascular: Normal rate and regular rhythm.   Pulmonary/Chest: Effort normal and breath sounds normal.  Abdominal: Soft. Bowel sounds are normal. She exhibits no distension and no mass. There is tenderness (diffuse lower abdomen tenderness, and epigastric tenderness). There is no rebound and no guarding.  Musculoskeletal: Normal range of motion.  Lymphadenopathy:    She has no cervical adenopathy.  Neurological: She is alert and oriented to person, place, and time. She has normal reflexes.  Skin: Skin is warm and dry. No rash noted.       Assessment & Plan:  Anemia- acute drop from 1 month ago- ? CKD/PPI contributing versus GI bleed- will check anemia labs, can switch to H2 BID Abdominal pain- diffuse likely gastroenteritis however will recheck LFTs, amalyse, and CBC HTN- monitor at home, continue meds

## 2013-09-01 LAB — FOLATE RBC: RBC Folate: 1125 ng/mL (ref >280)

## 2013-09-30 ENCOUNTER — Other Ambulatory Visit: Payer: Self-pay | Admitting: Emergency Medicine

## 2013-10-26 ENCOUNTER — Ambulatory Visit (INDEPENDENT_AMBULATORY_CARE_PROVIDER_SITE_OTHER): Payer: Medicare Other | Admitting: *Deleted

## 2013-10-26 DIAGNOSIS — N3000 Acute cystitis without hematuria: Secondary | ICD-10-CM | POA: Diagnosis not present

## 2013-10-26 MED ORDER — CIPROFLOXACIN HCL 500 MG PO TABS: 500.0000 mg | ORAL_TABLET | Freq: Two times a day (BID) | ORAL | Status: AC

## 2013-10-26 NOTE — Progress Notes (Signed)
Patient ID: [name] [name], female   DOB: [birth year], 80 y.o.   MRN: [id number] Patient presents for NV with c/o UTI symptoms.  Dysuria, increased frequency, lower abd pain.  Symptoms increasing in severity.  UA, C&S checked today.  Cipro abx sent into pharmacy to help alleviate symptoms, and advised we may have to change abx when C&S results.

## 2013-10-27 LAB — URINALYSIS, ROUTINE W REFLEX MICROSCOPIC
Bilirubin Urine: NEGATIVE
Glucose, UA: NEGATIVE mg/dL
Ketones, ur: NEGATIVE mg/dL
Nitrite: NEGATIVE
PH: 7 (ref 5.0–8.0)
PROTEIN: 30 mg/dL — AB
Specific Gravity, Urine: 1.014 (ref 1.005–1.030)
Urobilinogen, UA: 0.2 mg/dL (ref 0.0–1.0)

## 2013-10-27 LAB — URINALYSIS, MICROSCOPIC ONLY
Casts: NONE SEEN
Crystals: NONE SEEN
WBC, UA: >50 WBC/hpf — AB (ref <3)

## 2013-10-29 ENCOUNTER — Other Ambulatory Visit: Payer: Self-pay | Admitting: Internal Medicine

## 2013-10-29 DIAGNOSIS — N3000 Acute cystitis without hematuria: Secondary | ICD-10-CM

## 2013-10-29 LAB — URINE CULTURE: Colony Count: >=100000

## 2013-10-29 MED ORDER — CEFUROXIME AXETIL 250 MG PO TABS: 250.0000 mg | ORAL_TABLET | Freq: Two times a day (BID) | ORAL | Status: AC

## 2013-11-01 ENCOUNTER — Telehealth: Payer: Self-pay | Admitting: Cardiology

## 2013-11-01 NOTE — Telephone Encounter (Signed)
New message     Pt has gained wt.  She has CHF.  Today she is 195lbs.  Pt feels like she is wheezing but no SOB.

## 2013-11-01 NOTE — Telephone Encounter (Signed)
How much lasix is she on?

## 2013-11-01 NOTE — Telephone Encounter (Signed)
Called Pt and she has been having issues with her weight. She stated that her weight has gone up over the past weekend. She has been around 191 now it went up 196 and now down today to 195.  Pt has been on Antibiotics due to UTI. She has had some swelling in her legs. BP today was 130/95. Pt has had some wheezing but no SOB or issues breathing. Daughter is concerned UTI could be causing it.

## 2013-11-02 NOTE — Telephone Encounter (Signed)
Weight actually is below her weight when she was in for OV at which time BNP was normal.  Her weight has come down since the 28th. Please fine out if she still has any swelling

## 2013-11-02 NOTE — Telephone Encounter (Signed)
Pt stated she is taking 40 MG Every Mon, Weds, and Friday   9/15 190.8lb 9/16  192lb 9/19  192.8lb 9/20  193.4l 9/22  192.8lb Started Cipro on this day 9/23 193.6lb  9/24 192.8lb 9/25 195 Switched to Cefuroxime axetr 250 MG 9/26 196 lb 9/27 194.8 lb 9/28 195 lb 9/29  191.8 lb

## 2013-11-03 NOTE — Telephone Encounter (Signed)
Continue current medical regimen and call if she gains more than 3 lbs in 1 day or 5 lbs in 1 week

## 2013-11-03 NOTE — Telephone Encounter (Signed)
Daughter got Pt on the phone and I asked her about the swelling. Pt stated that her weight has stayed the same since yesterday at 191.8lb. She stated that her legs look and feel normal, no swelling per pt.

## 2013-11-03 NOTE — Telephone Encounter (Signed)
Pt is aware and is agreeable to plan.

## 2013-11-11 ENCOUNTER — Ambulatory Visit (INDEPENDENT_AMBULATORY_CARE_PROVIDER_SITE_OTHER): Payer: Medicare Other | Admitting: Physician Assistant

## 2013-11-11 VITALS — BP 132/62 | HR 60 | Temp 97.7°F | Resp 16 | Ht 64.0 in | Wt 197.0 lb

## 2013-11-11 DIAGNOSIS — Z716 Tobacco abuse counseling: Secondary | ICD-10-CM

## 2013-11-11 DIAGNOSIS — Z79899 Other long term (current) drug therapy: Secondary | ICD-10-CM | POA: Diagnosis not present

## 2013-11-11 DIAGNOSIS — E1129 Type 2 diabetes mellitus with other diabetic kidney complication: Secondary | ICD-10-CM | POA: Diagnosis not present

## 2013-11-11 DIAGNOSIS — N3000 Acute cystitis without hematuria: Secondary | ICD-10-CM

## 2013-11-11 DIAGNOSIS — N183 Chronic kidney disease, stage 3 unspecified: Secondary | ICD-10-CM

## 2013-11-11 DIAGNOSIS — Z23 Encounter for immunization: Secondary | ICD-10-CM | POA: Diagnosis not present

## 2013-11-11 DIAGNOSIS — E1122 Type 2 diabetes mellitus with diabetic chronic kidney disease: Secondary | ICD-10-CM | POA: Diagnosis not present

## 2013-11-11 DIAGNOSIS — I10 Essential (primary) hypertension: Secondary | ICD-10-CM | POA: Diagnosis not present

## 2013-11-11 DIAGNOSIS — E782 Mixed hyperlipidemia: Secondary | ICD-10-CM

## 2013-11-11 DIAGNOSIS — F17210 Nicotine dependence, cigarettes, uncomplicated: Secondary | ICD-10-CM

## 2013-11-11 DIAGNOSIS — E559 Vitamin D deficiency, unspecified: Secondary | ICD-10-CM

## 2013-11-11 LAB — CBC WITH DIFFERENTIAL/PLATELET
Basophils Absolute: 0.1 10*3/uL (ref 0.0–0.1)
Basophils Relative: 1 % (ref 0–1)
Eosinophils Absolute: 0.5 10*3/uL (ref 0.0–0.7)
Eosinophils Relative: 6 % — ABNORMAL HIGH (ref 0–5)
HCT: 34.9 % — ABNORMAL LOW (ref 36.0–46.0)
Hemoglobin: 11.9 g/dL — ABNORMAL LOW (ref 12.0–15.0)
LYMPHS ABS: 1.6 10*3/uL (ref 0.7–4.0)
LYMPHS PCT: 20 % (ref 12–46)
MCH: 31.7 pg (ref 26.0–34.0)
MCHC: 34.1 g/dL (ref 30.0–36.0)
MCV: 93.1 fL (ref 78.0–100.0)
MONOS PCT: 9 % (ref 3–12)
Monocytes Absolute: 0.7 10*3/uL (ref 0.1–1.0)
NEUTROS PCT: 64 % (ref 43–77)
Neutro Abs: 5.1 10*3/uL (ref 1.7–7.7)
Platelets: 211 10*3/uL (ref 150–400)
RBC: 3.75 MIL/uL — AB (ref 3.87–5.11)
RDW: 14.1 % (ref 11.5–15.5)
WBC: 7.9 10*3/uL (ref 4.0–10.5)

## 2013-11-11 NOTE — Progress Notes (Signed)
Assessment and Plan:  Hypertension: Continue medication, monitor blood pressure at home. Continue DASH diet. Cholesterol: Continue diet and exercise. Check cholesterol.  Diabetes-Continue diet and exercise. Check A1C Vitamin D Def- check level and continue medications.  Recurrent UTI- recheck UA C&S, has cipro at home to start while pending culture, if any confusion, AB pain, nausea, etc will go to ER   Continue diet and meds as discussed. Further disposition pending results of labs. Discussed med's effects and SE's.    HPI 80 y.o. female  presents for 3 month follow up with hypertension, hyperlipidemia, diabetes and vitamin D. Her blood pressure has been controlled at home, today their BP is BP: 132/62 mmHg She does not workout. She denies chest pain, shortness of breath, dizziness.  She is on cholesterol medication and denies myalgias. Her cholesterol is at goal. The cholesterol last visit was:   Lab Results  Component Value Date   CHOL 142 08/03/2013   HDL 47 08/03/2013   LDLCALC 68 08/03/2013   TRIG 133 08/03/2013   CHOLHDL 3.0 08/03/2013   She has been working on diet and exercise for Diabetes, and denies paresthesia of the feet, polydipsia and polyuria. Last A1C in the office was:  Lab Results  Component Value Date   HGBA1C 6.2* 08/03/2013   Patient is on Vitamin D supplement. Lab Results  Component Value Date   VD25OH 64 08/03/2013     Patient has a history of recurrent UTI, she was given cipro last visit but her last culture showed resistance to this so she was given Ceftin however she states she continues to have hesitancy, frequency, with decreased urine output, dysuria, and incontinence, denies fever, chills, back pain, AB pain, nausea, last C&S showed: Lab Results  Component Value Date   LABORGA PSEUDOMONAS AERUGINOSA 10/26/2013   She has a history of diastolic CHF and feels that she is retaining fluids, she complains of bilateral leg edema, she denies orthopnea/PND. Weight  is up 4 lbs.  Wt Readings from Last 3 Encounters:  11/11/13 197 lb (89.359 kg)  08/31/13 193 lb (87.544 kg)  08/28/13 193 lb (87.544 kg)    Current Medications:  Current Outpatient Prescriptions on File Prior to Visit  Medication Sig Dispense Refill  . acidophilus (RISAQUAD) CAPS Take 1 capsule by mouth every morning.      Marland Kitchen allopurinol (ZYLOPRIM) 300 MG tablet Take 150 mg by mouth at bedtime.      . ALPRAZolam (XANAX) 1 MG tablet Take 0.5-1 mg by mouth at bedtime as needed for anxiety or sleep.      Marland Kitchen aspirin EC 81 MG tablet Take 81 mg by mouth every evening.      Marland Kitchen atenolol (TENORMIN) 100 MG tablet Take 50 mg by mouth every evening.       . cholecalciferol (VITAMIN D) 1000 UNITS tablet Take 3,000 Units by mouth every morning.       Marland Kitchen doxazosin (CARDURA) 8 MG tablet Take 4 mg by mouth at bedtime.      . Flaxseed, Linseed, (FLAX SEED OIL) 1000 MG CAPS Take 1 capsule by mouth every morning.      . furosemide (LASIX) 40 MG tablet TAKE 1 on Monday Wed and Friday      . gabapentin (NEURONTIN) 300 MG capsule Take 300 mg by mouth at bedtime.      Marland Kitchen gemfibrozil (LOPID) 600 MG tablet Take 600 mg by mouth every evening.      Marland Kitchen glucose blood (FREESTYLE LITE) test strip  Use once daily or as directed for fluctuating blood sugars. Dx. 250.00  100 each  3  . loperamide (IMODIUM A-D) 2 MG tablet Take 2 mg by mouth 4 (four) times daily as needed for diarrhea or loose stools.      Marland Kitchen loratadine (CLARITIN) 10 MG tablet Take 10 mg by mouth every morning.      Marland Kitchen losartan (COZAAR) 100 MG tablet Take 100 mg by mouth daily.      . Magnesium 200 MG TABS Take 400 mg by mouth daily.      Marland Kitchen omeprazole (PRILOSEC) 20 MG capsule Take 20 mg by mouth every morning.      . ranitidine (ZANTAC) 300 MG tablet Take 1 tablet (300 mg total) by mouth 2 (two) times daily.  60 tablet  3  . vitamin B-12 (CYANOCOBALAMIN) 1000 MCG tablet Take 1,000 mcg by mouth every morning.      . vitamin C (ASCORBIC ACID) 500 MG tablet Take 500  mg by mouth 2 (two) times daily.        No current facility-administered medications on file prior to visit.   Medical History:  Past Medical History  Diagnosis Date  . Obesity   . Hyperlipidemia   . Type II or unspecified type diabetes mellitus without mention of complication, not stated as uncontrolled   . Allergy   . Arthritis   . Gout   . Vitamin D deficiency   . Carotid artery stenosis     1-39% right and 40-59% left by dopplers 2015  . Chronic diastolic CHF (congestive heart failure), NYHA class 1   . Hypertension    Allergies:  Allergies  Allergen Reactions  . Codeine   . Lisinopril Other (See Comments)    fatigue  . Tylenol Pm Extra [Diphenhydramine-Apap (Sleep)]      Review of Systems: [X]  = complains of  [ ]  = denies  General: Fatigue [ ]  Fever [ ]  Chills [ ]  Weakness [ ]   Insomnia [ ]  Eyes: Redness [ ]  Blurred vision [ ]  Diplopia [ ]   ENT: Congestion [ ]  Sinus Pain [ ]  Post Nasal Drip [ ]  Sore Throat [ ]  Earache [ ]   Cardiac: Chest pain/pressure [ ]  SOB [ ]  Orthopnea [ ]   Palpitations [ ]   Paroxysmal nocturnal dyspnea[ ]  Claudication [ ]  Edema [ ]   Pulmonary: Cough [ ]  Wheezing[ ]   SOB [ ]   Snoring [ ]   GI: Nausea [ ]  Vomiting[ ]  Dysphagia[ ]  Heartburn[ ]  Abdominal pain [ ]  Constipation [ ] ; Diarrhea [ ] ; BRBPR [ ]  Melena[ ]  GU: Hematuria[ ]  Dysuria [x ] Nocturia[x ] Urgency [x ]  Hesitancy [ x] Discharge [ ]  Neuro: Headaches[ ]  Vertigo[ ]  Paresthesias[ ]  Spasm [ ]  Speech changes [ ]  Incoordination [ ]   Ortho: Arthritis [ ]  Joint pain [ ]  Muscle pain [ ]  Joint swelling [ ]  Back Pain [ ]  Skin:  Rash [ ]   Pruritis [ ]  Change in skin lesion [ ]   Psych: Depression[ ]  Anxiety[ ]  Confusion [ ]  Memory loss [ ]   Heme/Lypmh: Bleeding [ ]  Bruising [ ]  Enlarged lymph nodes [ ]   Endocrine: Visual blurring [ ]  Paresthesia [ ]  Polyuria [ ]  Polydypsea [ ]    Heat/cold intolerance [ ]  Hypoglycemia [ ]   Family history- Review and unchanged Social history- Review and  unchanged Physical Exam: BP 132/62  Pulse 60  Temp(Src) 97.7 F (36.5 C)  Resp 16  Ht 5\' 4"  (1.626 m)  Wt 197 lb (89.359 kg)  BMI 33.80 kg/m2 Wt Readings from Last 3 Encounters:  11/11/13 197 lb (89.359 kg)  08/31/13 193 lb (87.544 kg)  08/28/13 193 lb (87.544 kg)   HEENT: normocephalic, sclerae anicteric, TMs pearly, nares patent, no discharge or erythema, pharynx normal Oral cavity: MMM, no lesions Neck: supple, no lymphadenopathy, no thyromegaly, no masses Heart: RRR, normal S1, S2, no murmurs Lungs: CTA bilaterally, no wheezes, rhonchi, or rales Abdomen: +bs, soft, obese non tender, non distended, no masses, no hepatomegaly, no splenomegaly, no CVA tenderness Musculoskeletal: nontender, no swelling, no obvious deformity Extremities: 1-2 + edema, no cyanosis, no clubbing Pulses: 2+ symmetric, upper and lower extremities, normal cap refill Neurological: alert, oriented x 3, CN2-12 intact, strength normal upper extremities and lower extremities, sensation decreased bilateral feet, DTRs 2+ throughout, + romberg, negative finger to nose, gait slow with cane Psychiatric: normal affect, behavior normal, pleasant   Vicie Mutters 10:11 AM

## 2013-11-11 NOTE — Patient Instructions (Signed)

## 2013-11-12 LAB — URINALYSIS, MICROSCOPIC ONLY
Bacteria, UA: NONE SEEN
CRYSTALS: NONE SEEN
Casts: NONE SEEN
SQUAMOUS EPITHELIAL / LPF: NONE SEEN
WBC, UA: >50 WBC/hpf — AB (ref <3)

## 2013-11-12 LAB — URINALYSIS, ROUTINE W REFLEX MICROSCOPIC
BILIRUBIN URINE: NEGATIVE
Glucose, UA: NEGATIVE mg/dL
Ketones, ur: NEGATIVE mg/dL
Nitrite: NEGATIVE
PROTEIN: NEGATIVE mg/dL
Specific Gravity, Urine: 1.011 (ref 1.005–1.030)
Urobilinogen, UA: 0.2 mg/dL (ref 0.0–1.0)
pH: 6.5 (ref 5.0–8.0)

## 2013-11-12 LAB — LIPID PANEL
CHOL/HDL RATIO: 2.8 ratio
Cholesterol: 148 mg/dL (ref 0–200)
HDL: 53 mg/dL (ref >39)
LDL CALC: 75 mg/dL (ref 0–99)
Triglycerides: 100 mg/dL (ref <150)
VLDL: 20 mg/dL (ref 0–40)

## 2013-11-12 LAB — BASIC METABOLIC PANEL WITH GFR
BUN: 54 mg/dL — ABNORMAL HIGH (ref 6–23)
CHLORIDE: 98 meq/L (ref 96–112)
CO2: 26 meq/L (ref 19–32)
Calcium: 9.8 mg/dL (ref 8.4–10.5)
Creat: 1.53 mg/dL — ABNORMAL HIGH (ref 0.50–1.10)
GFR, Est African American: 37 mL/min — ABNORMAL LOW
GFR, Est Non African American: 32 mL/min — ABNORMAL LOW
GLUCOSE: 93 mg/dL (ref 70–99)
Potassium: 4.9 mEq/L (ref 3.5–5.3)
Sodium: 134 mEq/L — ABNORMAL LOW (ref 135–145)

## 2013-11-12 LAB — HEPATIC FUNCTION PANEL
ALK PHOS: 95 U/L (ref 39–117)
ALT: 9 U/L (ref 0–35)
AST: 20 U/L (ref 0–37)
Albumin: 4.3 g/dL (ref 3.5–5.2)
BILIRUBIN DIRECT: 0.1 mg/dL (ref 0.0–0.3)
BILIRUBIN INDIRECT: 0.4 mg/dL (ref 0.2–1.2)
BILIRUBIN TOTAL: 0.5 mg/dL (ref 0.2–1.2)
Total Protein: 6.9 g/dL (ref 6.0–8.3)

## 2013-11-12 LAB — TSH: TSH: 2.371 u[IU]/mL (ref 0.350–4.500)

## 2013-11-12 LAB — HEMOGLOBIN A1C
HEMOGLOBIN A1C: 6.4 % — AB (ref <5.7)
MEAN PLASMA GLUCOSE: 137 mg/dL — AB (ref <117)

## 2013-11-12 LAB — INSULIN, FASTING: Insulin fasting, serum: 4.4 u[IU]/mL (ref 2.0–19.6)

## 2013-11-12 LAB — MAGNESIUM: Magnesium: 2.4 mg/dL (ref 1.5–2.5)

## 2013-11-13 LAB — URINE CULTURE: Colony Count: >=100000

## 2013-11-15 ENCOUNTER — Other Ambulatory Visit: Payer: Self-pay | Admitting: Physician Assistant

## 2013-11-15 MED ORDER — CEFUROXIME AXETIL 250 MG PO TABS: 500.0000 mg | ORAL_TABLET | Freq: Two times a day (BID) | ORAL | Status: AC

## 2013-11-16 DIAGNOSIS — H52223 Regular astigmatism, bilateral: Secondary | ICD-10-CM | POA: Diagnosis not present

## 2013-11-16 DIAGNOSIS — H59093 Other disorders of the eye following cataract surgery, bilateral: Secondary | ICD-10-CM | POA: Diagnosis not present

## 2013-11-16 DIAGNOSIS — E119 Type 2 diabetes mellitus without complications: Secondary | ICD-10-CM | POA: Diagnosis not present

## 2013-11-16 DIAGNOSIS — H5202 Hypermetropia, left eye: Secondary | ICD-10-CM | POA: Diagnosis not present

## 2013-11-16 DIAGNOSIS — H5211 Myopia, right eye: Secondary | ICD-10-CM | POA: Diagnosis not present

## 2013-11-24 ENCOUNTER — Ambulatory Visit (INDEPENDENT_AMBULATORY_CARE_PROVIDER_SITE_OTHER): Payer: Medicare Other | Admitting: *Deleted

## 2013-11-24 DIAGNOSIS — N390 Urinary tract infection, site not specified: Secondary | ICD-10-CM

## 2013-11-24 LAB — URINALYSIS, MICROSCOPIC ONLY
Bacteria, UA: NONE SEEN
Casts: NONE SEEN
Crystals: NONE SEEN

## 2013-11-24 LAB — URINALYSIS, ROUTINE W REFLEX MICROSCOPIC
Bilirubin Urine: NEGATIVE
Glucose, UA: NEGATIVE mg/dL
Hgb urine dipstick: NEGATIVE
Ketones, ur: NEGATIVE mg/dL
NITRITE: NEGATIVE
PH: 6.5 (ref 5.0–8.0)
Protein, ur: NEGATIVE mg/dL
Specific Gravity, Urine: 1.012 (ref 1.005–1.030)
UROBILINOGEN UA: 0.2 mg/dL (ref 0.0–1.0)

## 2013-11-24 NOTE — Progress Notes (Signed)
Patient ID: [name] [name], female   DOB: 08-19-1930, 80 y.o.   MRN: [id number] Patient presents for recheck UA, C&S.

## 2013-11-26 LAB — URINE CULTURE

## 2013-11-28 ENCOUNTER — Telehealth: Payer: Self-pay | Admitting: Physician Assistant

## 2013-11-28 MED ORDER — CEFUROXIME AXETIL 500 MG PO TABS: 500.0000 mg | ORAL_TABLET | Freq: Two times a day (BID) | ORAL | Status: DC

## 2013-11-28 NOTE — Addendum Note (Signed)
Addended by: Vicie Mutters R on: 11/28/2013 03:25 PM   Modules accepted: Orders

## 2013-11-29 ENCOUNTER — Other Ambulatory Visit: Payer: Self-pay

## 2013-11-29 ENCOUNTER — Ambulatory Visit: Payer: Self-pay

## 2013-11-29 MED ORDER — CEFUROXIME AXETIL 500 MG PO TABS
500.0000 mg | ORAL_TABLET | Freq: Two times a day (BID) | ORAL | Status: AC
Start: 2013-11-29 — End: 2013-12-09

## 2013-11-29 NOTE — Telephone Encounter (Signed)
Error

## 2013-12-20 ENCOUNTER — Ambulatory Visit (INDEPENDENT_AMBULATORY_CARE_PROVIDER_SITE_OTHER): Payer: Medicare Other | Admitting: Cardiology

## 2013-12-20 ENCOUNTER — Encounter: Payer: Self-pay | Admitting: Cardiology

## 2013-12-20 VITALS — Ht 64.0 in | Wt 197.0 lb

## 2013-12-20 DIAGNOSIS — I5032 Chronic diastolic (congestive) heart failure: Secondary | ICD-10-CM | POA: Diagnosis not present

## 2013-12-20 DIAGNOSIS — I10 Essential (primary) hypertension: Secondary | ICD-10-CM | POA: Diagnosis not present

## 2013-12-20 DIAGNOSIS — I6529 Occlusion and stenosis of unspecified carotid artery: Secondary | ICD-10-CM

## 2013-12-20 DIAGNOSIS — I6523 Occlusion and stenosis of bilateral carotid arteries: Secondary | ICD-10-CM

## 2013-12-20 NOTE — Progress Notes (Signed)
[place], [address], [place]  [address] Phone: [phone] Fax:  [phone]  Date:  12/20/2013   ID:  [name] [name], DOB 04-13-1930, MRN [id number]  PCP:  Alesia Richards, MD  Cardiologist:  Fransico Him, MD    History of Present Illness: [name] [name] is a 79 y.o. female with a history of HTN, dyslipidemia, obesity, DM, mild pulmonary HTN, moderate TR and chronic diastolic CHF and moderate left carotid artery stenosis who presents today for followup.  She is doing well.  She denies any chest pain.  She has chronic SOB but only gets DOE when carrying groceries.  She has chronic LE edema which controlled with diuretics.  She denies any palpitations, dizziness or syncope.  She is having some pain in her left hip and knee that is due to pain from arthritis.   Wt Readings from Last 3 Encounters:  11/11/13 197 lb (89.359 kg)  08/31/13 193 lb (87.544 kg)  08/28/13 193 lb (87.544 kg)     Past Medical History  Diagnosis Date  . Obesity   . Hyperlipidemia   . Type II or unspecified type diabetes mellitus without mention of complication, not stated as uncontrolled   . Allergy   . Arthritis   . Gout   . Vitamin D deficiency   . Carotid artery stenosis     1-39% right and 40-59% left by dopplers 2015  . Chronic diastolic CHF (congestive heart failure), NYHA class 1   . Hypertension     Current Outpatient Prescriptions  Medication Sig Dispense Refill  . allopurinol (ZYLOPRIM) 300 MG tablet Take 150 mg by mouth at bedtime.    . ALPRAZolam (XANAX) 1 MG tablet Take 0.5-1 mg by mouth at bedtime as needed for anxiety or sleep.    Marland Kitchen aspirin EC 81 MG tablet Take 81 mg by mouth every evening.    Marland Kitchen atenolol (TENORMIN) 100 MG tablet Take 50 mg by mouth every evening.     . cholecalciferol (VITAMIN D) 1000 UNITS tablet Take 3,000 Units by mouth every morning.     Marland Kitchen doxazosin (CARDURA) 8 MG tablet Take 4 mg by mouth at bedtime.    . Flaxseed, Linseed, (FLAX SEED OIL) 1000  MG CAPS Take 1 capsule by mouth every morning.    . furosemide (LASIX) 40 MG tablet TAKE 1 on Monday Wed and Friday    . gabapentin (NEURONTIN) 300 MG capsule Take 300 mg by mouth at bedtime.    Marland Kitchen glucose blood (FREESTYLE LITE) test strip Use once daily or as directed for fluctuating blood sugars. Dx. 250.00 100 each 3  . loperamide (IMODIUM A-D) 2 MG tablet Take 2 mg by mouth 4 (four) times daily as needed for diarrhea or loose stools.    Marland Kitchen loratadine (CLARITIN) 10 MG tablet Take 10 mg by mouth every morning.    Marland Kitchen losartan (COZAAR) 100 MG tablet Take 100 mg by mouth daily.    . Magnesium 200 MG TABS Take 400 mg by mouth daily.    Marland Kitchen omeprazole (PRILOSEC) 20 MG capsule Take 20 mg by mouth every morning.    . ranitidine (ZANTAC) 300 MG tablet Take 1 tablet (300 mg total) by mouth 2 (two) times daily. (Patient taking differently: Take 300 mg by mouth as needed. ) 60 tablet 3  . vitamin B-12 (CYANOCOBALAMIN) 1000 MCG tablet Take 1,000 mcg by mouth every morning.    . vitamin C (ASCORBIC ACID) 500 MG tablet Take 500 mg  by mouth 2 (two) times daily.      No current facility-administered medications for this visit.    Allergies:    Allergies  Allergen Reactions  . Codeine   . Lisinopril Other (See Comments)    fatigue  . Tylenol Pm Extra [Diphenhydramine-Apap (Sleep)]     Social History:  The patient  reports that she has never smoked. She has never used smokeless tobacco. She reports that she does not drink alcohol or use illicit drugs.   Family History:  The patient's family history includes Asthma in her father and son; Diabetes in her brother; Heart attack in her father; Hyperlipidemia in her father; Hypertension in her father, mother, and son; Sleep apnea in her daughter; Stroke in her mother.   ROS:  Please see the history of present illness.      All other systems reviewed and negative.   PHYSICAL EXAM: VS:  There were no vitals taken for this visit. Well nourished, well developed,  in no acute distress HEENT: normal Neck: no JVD Cardiac:  normal S1, S2; RRR; no murmur Lungs:  clear to auscultation bilaterally, no wheezing, rhonchi or rales Abd: soft, nontender, no hepatomegaly Ext: no edema Skin: warm and dry Neuro:  CNs 2-12 intact, no focal abnormalities noted   ASSESSMENT AND PLAN:  1. Chronic diastolic CHF Stage I - 2D echo with normal LVF and diastolic dysfunction. Her nuclear stress test showed a very small area of reduced uptake in the basal inferolateral wall that most likely represents breast attenuation and is low risk. - continue Lasix / beta blocker 2. HTN well controlled - continue beta blocker/ARB/doxazosin 3.  Moderate carotid artery stenosis on dopplers  - repeat carotid dopplers in 1 year  - continue ASA       4.  Dyslipidemia - her PCP stopped her lopid because her LDL was normal at last OV at 75 .  She is intolerant to statins.  I will recheck her lipids in 2 months fasting and see if her LDL has increased  Followup with me in 6 months     Signed, Fransico Him, MD [facility] 12/20/2013 3:40 PM

## 2013-12-20 NOTE — Patient Instructions (Addendum)
Your physician recommends that you return for lab work on February 14, 2014 (FASTING LABS). Come anytime that day between 7:30 and 5:15PM.   Your physician recommends that you continue on your current medications as directed. Please refer to the Current Medication list given to you today.  Your physician wants you to follow-up in: 6 months with Dr. Radford Pax. You will receive a reminder letter in the mail two months in advance. If you don't receive a letter, please call our office to schedule the follow-up appointment.

## 2013-12-21 DIAGNOSIS — T8484XA Pain due to internal orthopedic prosthetic devices, implants and grafts, initial encounter: Secondary | ICD-10-CM | POA: Diagnosis not present

## 2013-12-21 DIAGNOSIS — M4698 Unspecified inflammatory spondylopathy, sacral and sacrococcygeal region: Secondary | ICD-10-CM | POA: Diagnosis not present

## 2013-12-21 DIAGNOSIS — M4806 Spinal stenosis, lumbar region: Secondary | ICD-10-CM | POA: Diagnosis not present

## 2013-12-23 ENCOUNTER — Other Ambulatory Visit: Payer: Self-pay | Admitting: Emergency Medicine

## 2013-12-23 ENCOUNTER — Other Ambulatory Visit: Payer: Self-pay | Admitting: Internal Medicine

## 2013-12-23 MED ORDER — ATENOLOL 100 MG PO TABS: ORAL_TABLET | ORAL | Status: DC

## 2013-12-24 ENCOUNTER — Encounter: Payer: Self-pay | Admitting: *Deleted

## 2013-12-24 ENCOUNTER — Other Ambulatory Visit: Payer: Self-pay | Admitting: Internal Medicine

## 2013-12-24 MED ORDER — ALPRAZOLAM 1 MG PO TABS: ORAL_TABLET | ORAL | Status: DC

## 2013-12-27 ENCOUNTER — Other Ambulatory Visit: Payer: Self-pay | Admitting: Emergency Medicine

## 2013-12-28 ENCOUNTER — Ambulatory Visit: Payer: Medicare Other | Admitting: Cardiology

## 2013-12-28 DIAGNOSIS — H26492 Other secondary cataract, left eye: Secondary | ICD-10-CM | POA: Diagnosis not present

## 2014-01-03 ENCOUNTER — Other Ambulatory Visit: Payer: Self-pay | Admitting: *Deleted

## 2014-01-03 MED ORDER — LOSARTAN POTASSIUM 100 MG PO TABS: 100.0000 mg | ORAL_TABLET | Freq: Every day | ORAL | Status: DC

## 2014-01-15 IMAGING — CR DG CHEST 2V
2 series · 2 of 2 positions shown · non-contrast
Comparison: August 02, 2008.

CLINICAL DATA: Chest pain, shortness of breath

CHEST - 2 VIEW

[w chest lat]
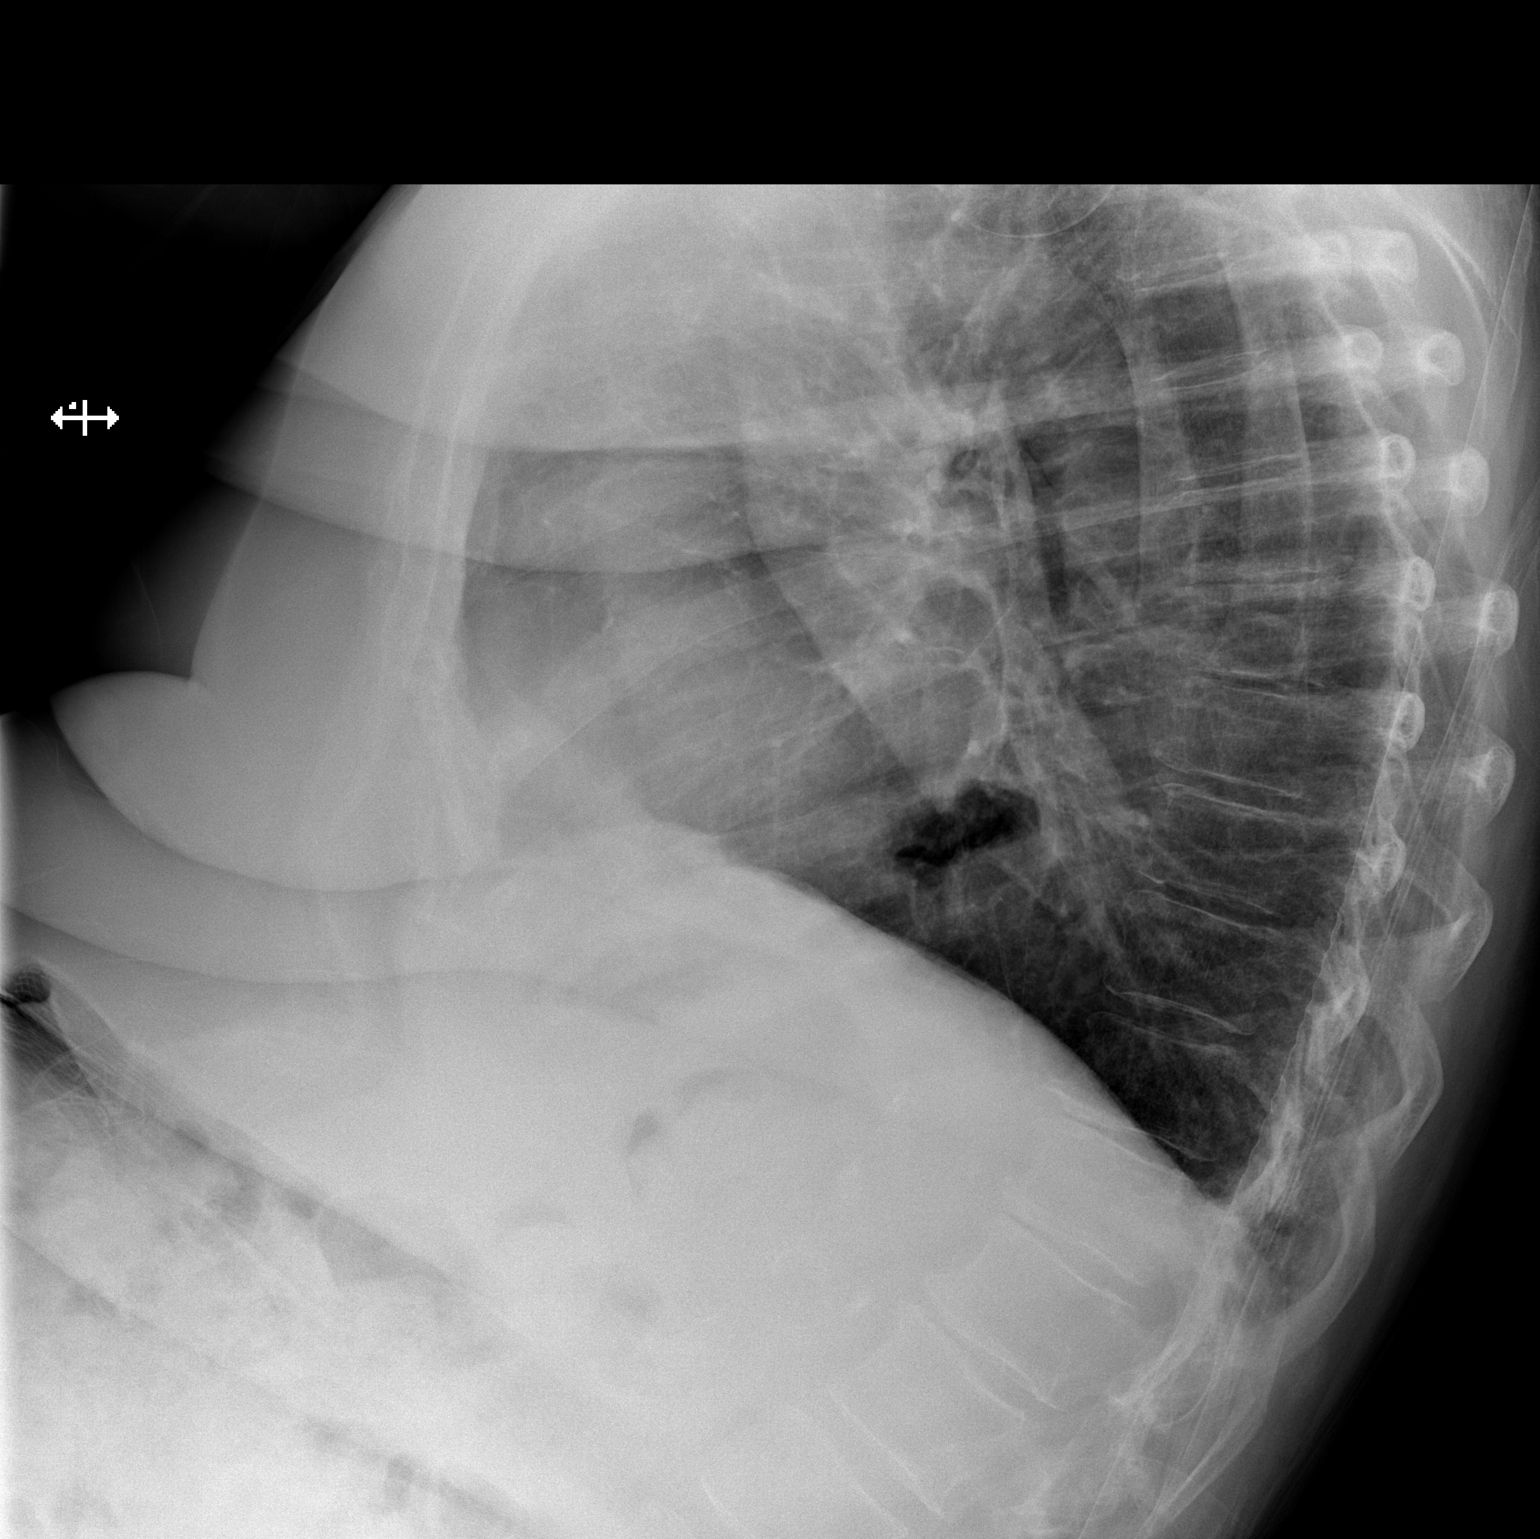

[x chest ap]
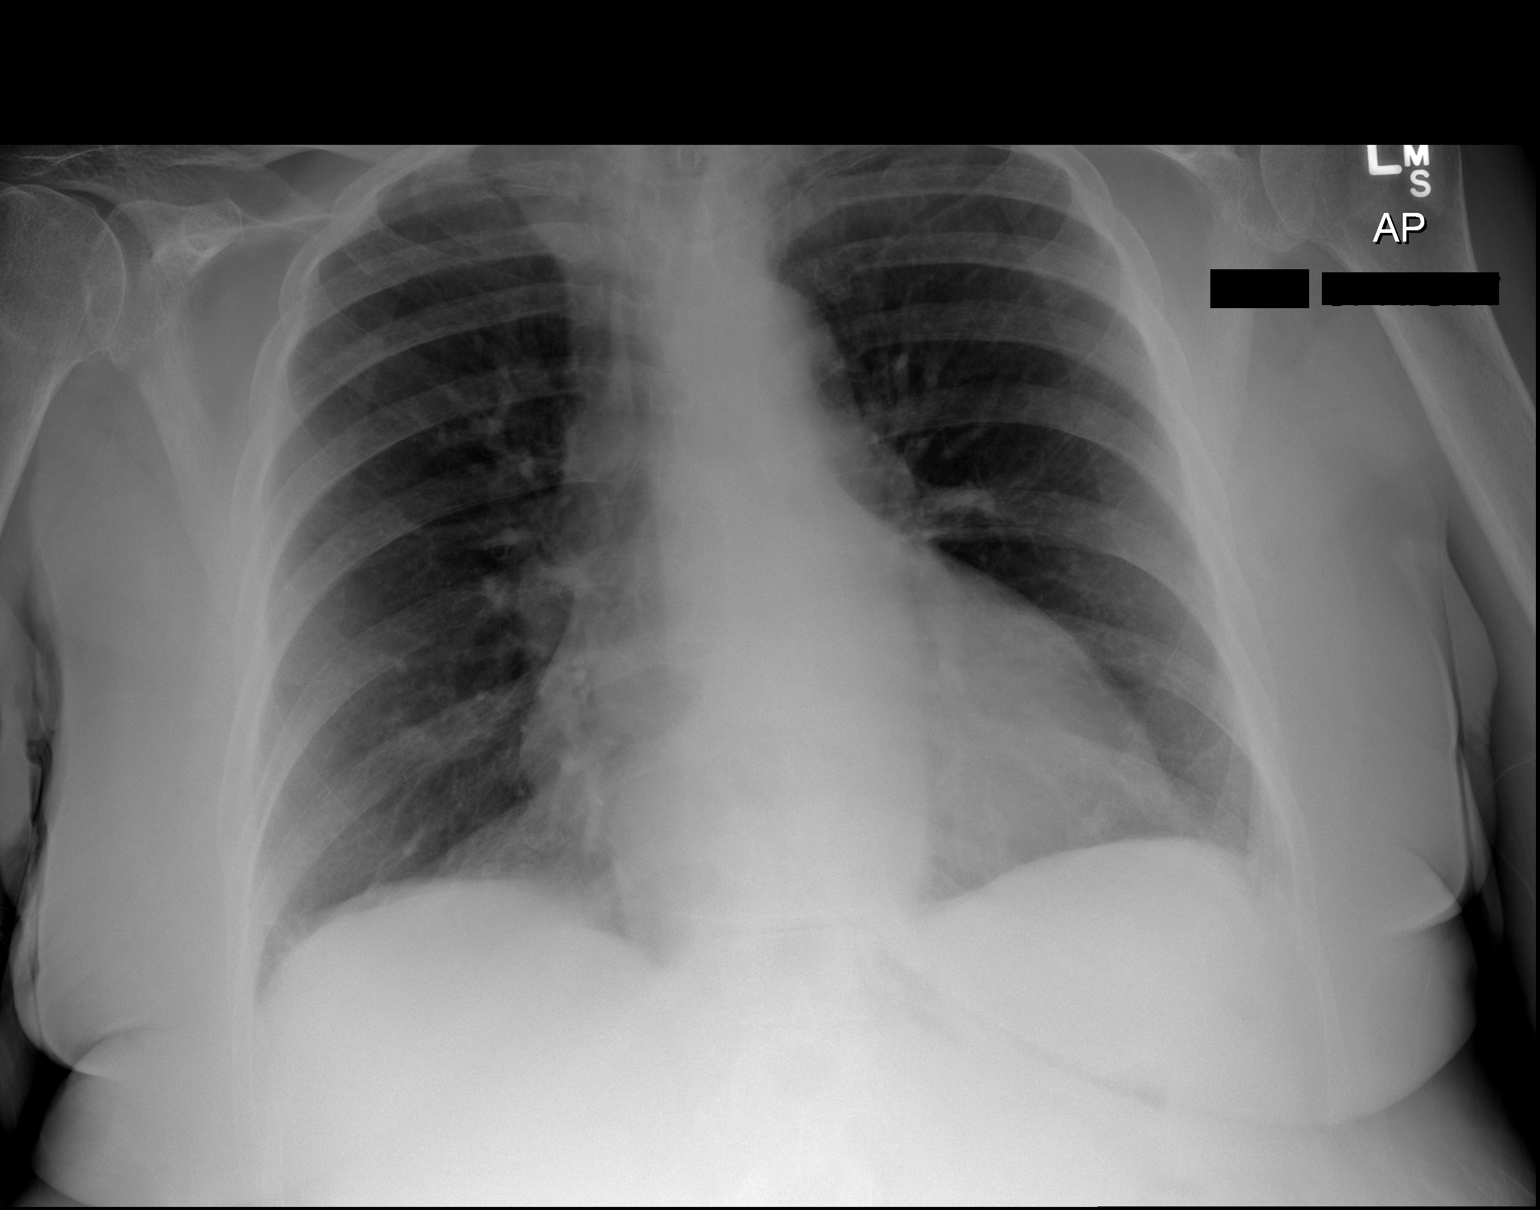

[2 of 2 positions shown; findings below may reference images not displayed]

FINDINGS: Cardiomediastinal silhouette appears normal.  No acute
pulmonary disease is noted.  Bony thorax is intact.
IMPRESSION: No acute cardiopulmonary abnormality seen.

## 2014-02-03 ENCOUNTER — Encounter: Payer: Self-pay | Admitting: Internal Medicine

## 2014-02-07 ENCOUNTER — Other Ambulatory Visit: Payer: Self-pay | Admitting: *Deleted

## 2014-02-07 MED ORDER — FREESTYLE LANCETS MISC: Status: DC

## 2014-02-14 ENCOUNTER — Other Ambulatory Visit (INDEPENDENT_AMBULATORY_CARE_PROVIDER_SITE_OTHER): Payer: Medicare Other | Admitting: *Deleted

## 2014-02-14 DIAGNOSIS — E782 Mixed hyperlipidemia: Secondary | ICD-10-CM | POA: Diagnosis not present

## 2014-02-14 DIAGNOSIS — I6523 Occlusion and stenosis of bilateral carotid arteries: Secondary | ICD-10-CM

## 2014-02-14 DIAGNOSIS — Z79899 Other long term (current) drug therapy: Secondary | ICD-10-CM

## 2014-02-14 LAB — ALT: ALT: 15 U/L (ref 0–35)

## 2014-02-14 LAB — LIPID PANEL
CHOL/HDL RATIO: 5
Cholesterol: 196 mg/dL (ref 0–200)
HDL: 41.6 mg/dL (ref >39.00)
NonHDL: 154.4
TRIGLYCERIDES: 246 mg/dL — AB (ref 0.0–149.0)
VLDL: 49.2 mg/dL — ABNORMAL HIGH (ref 0.0–40.0)

## 2014-02-14 LAB — LDL CHOLESTEROL, DIRECT: Direct LDL: 100.8 mg/dL

## 2014-02-18 ENCOUNTER — Other Ambulatory Visit: Payer: Self-pay | Admitting: *Deleted

## 2014-02-23 ENCOUNTER — Encounter: Payer: Self-pay | Admitting: Internal Medicine

## 2014-02-23 ENCOUNTER — Ambulatory Visit (INDEPENDENT_AMBULATORY_CARE_PROVIDER_SITE_OTHER): Payer: Medicare Other | Admitting: Internal Medicine

## 2014-02-23 VITALS — BP 126/70 | HR 72 | Temp 97.9°F | Resp 16 | Ht 63.5 in | Wt 198.8 lb

## 2014-02-23 DIAGNOSIS — I10 Essential (primary) hypertension: Secondary | ICD-10-CM

## 2014-02-23 DIAGNOSIS — N189 Chronic kidney disease, unspecified: Secondary | ICD-10-CM | POA: Diagnosis not present

## 2014-02-23 DIAGNOSIS — M1000 Idiopathic gout, unspecified site: Secondary | ICD-10-CM | POA: Diagnosis not present

## 2014-02-23 DIAGNOSIS — I5032 Chronic diastolic (congestive) heart failure: Secondary | ICD-10-CM

## 2014-02-23 DIAGNOSIS — Z1331 Encounter for screening for depression: Secondary | ICD-10-CM

## 2014-02-23 DIAGNOSIS — K2100 Gastro-esophageal reflux disease with esophagitis, without bleeding: Secondary | ICD-10-CM

## 2014-02-23 DIAGNOSIS — Z79899 Other long term (current) drug therapy: Secondary | ICD-10-CM | POA: Diagnosis not present

## 2014-02-23 DIAGNOSIS — N183 Chronic kidney disease, stage 3 unspecified: Secondary | ICD-10-CM

## 2014-02-23 DIAGNOSIS — Z1212 Encounter for screening for malignant neoplasm of rectum: Secondary | ICD-10-CM

## 2014-02-23 DIAGNOSIS — E1122 Type 2 diabetes mellitus with diabetic chronic kidney disease: Secondary | ICD-10-CM

## 2014-02-23 DIAGNOSIS — E559 Vitamin D deficiency, unspecified: Secondary | ICD-10-CM

## 2014-02-23 DIAGNOSIS — Z9181 History of falling: Secondary | ICD-10-CM

## 2014-02-23 DIAGNOSIS — E1129 Type 2 diabetes mellitus with other diabetic kidney complication: Secondary | ICD-10-CM | POA: Insufficient documentation

## 2014-02-23 DIAGNOSIS — N390 Urinary tract infection, site not specified: Secondary | ICD-10-CM

## 2014-02-23 DIAGNOSIS — E782 Mixed hyperlipidemia: Secondary | ICD-10-CM

## 2014-02-23 LAB — CBC WITH DIFFERENTIAL/PLATELET
BASOS PCT: 1 % (ref 0–1)
Basophils Absolute: 0.1 10*3/uL (ref 0.0–0.1)
Eosinophils Absolute: 0.4 10*3/uL (ref 0.0–0.7)
Eosinophils Relative: 5 % (ref 0–5)
HCT: 38.7 % (ref 36.0–46.0)
HEMOGLOBIN: 12.7 g/dL (ref 12.0–15.0)
LYMPHS ABS: 2.5 10*3/uL (ref 0.7–4.0)
Lymphocytes Relative: 35 % (ref 12–46)
MCH: 31.4 pg (ref 26.0–34.0)
MCHC: 32.8 g/dL (ref 30.0–36.0)
MCV: 95.6 fL (ref 78.0–100.0)
MONO ABS: 0.6 10*3/uL (ref 0.1–1.0)
MPV: 11.1 fL (ref 8.6–12.4)
Monocytes Relative: 9 % (ref 3–12)
Neutro Abs: 3.6 10*3/uL (ref 1.7–7.7)
Neutrophils Relative %: 50 % (ref 43–77)
Platelets: 199 10*3/uL (ref 150–400)
RBC: 4.05 MIL/uL (ref 3.87–5.11)
RDW: 14.3 % (ref 11.5–15.5)
WBC: 7.1 10*3/uL (ref 4.0–10.5)

## 2014-02-23 NOTE — Progress Notes (Signed)
Patient ID: [name] [name], female   DOB: 1930-02-20, 81 y.o.   MRN: [id number]  Annual Comprehensive Examination  This very nice 81 y.o. [employer] presents for complete physical.  Patient has been followed for HTN, T2_NIDDM w/CKD, Hyperlipidemia  and Vitamin D Deficiency.    HTN predates since 56. Patient's BP has been controlled at home and patient denies any cardiac symptoms as chest pain, palpitations, shortness of breath, dizziness, but does have chronic dependent ankle swelling. Today's BP: 126/70 mmHg .   Patient's hyperlipidemia is controlled with diet and medications. Patient denies myalgias or other medication SE's. Last lipids were at goal - Total Chol 196; HDL 41.60; LDL 75 ; and elevated Trig 246 on 11/11/2013.    Patient has T2_NIDDM w/CKD (with BUN 29 / Creat 1.34 and GFR 37 ml/min)  predating since 2003 and has attempted mgmt with diet.  Patient denies reactive hypoglycemic symptoms, visual blurring, diabetic polys, or paresthesias. Last A1c was 6.4% on 11/11/2013.    Patient also has hx/o DJD with generalized aches/pains & hx/o gout. Patient has hx/o recurrent UTI's. Finally, patient has history of Vitamin D Deficiency of 38 in 2010 and last Vitamin D was  64 on 08/03/2013.  Medication Sig  . allopurinol (ZYLOPRIM) 300 MG tablet Take 150 mg by mouth at bedtime.  . ALPRAZolam  1 MG tablet Take 1/2 to 1 tablet at hour of Sleep  . aspirin EC 81 MG tablet Take 81 mg by mouth every evening.  Marland Kitchen atenolol 100 MG tablet Take 1/2 to 1 tablet daily for BP as directed  . VITAMIN D  Take 3,000 Units by mouth every morning.   Marland Kitchen doxazosin  8 MG tablet Take 4 mg by mouth at bedtime.  Marland Kitchen FLAX SEED OIL 1000 MG  Take 1 capsule by mouth every morning.  . furosemide (LASIX) 40 MG tablet TAKE 1 on Monday Wed and Friday  . gabapentin (NEURONTIN) 300 MG capsule Take 300 mg by mouth at bedtime.  Marland Kitchen gemfibrozil (LOPID) 600 MG tablet Take 1 tablet (600 mg total) by mouth daily.  . Lancets (FREESTYLE) lancets  Check blood glucose 1 time daily.  [id number]  . loperamide (IMODIUM A-D) 2 MG tablet Take 2 mg by mouth 4 (four) times daily as needed   . loratadine (CLARITIN) 10 MG tablet Take 10 mg by mouth every morning.  Marland Kitchen losartan (COZAAR) 100 MG tablet Take 1 tablet (100 mg total) by mouth daily.  . Magnesium 200 MG TABS Take 400 mg by mouth daily.  Marland Kitchen omeprazole (PRILOSEC) 20 MG capsule Take 20 mg by mouth every morning.  . ranitidine (ZANTAC) 300 MG tablet Take 1 tablet (300 mg total) by mouth 2 (two) times daily.  . vitamin B-12  1000 MCG tablet Take 1,000 mcg by mouth every morning.  . vitamin C  500 MG tablet Take 500 mg by mouth 2 (two) times daily.    Allergies  Allergen Reactions  . Codeine   . Lisinopril Other (See Comments)    fatigue  . Tylenol Pm Extra [Diphenhydramine-Apap (Sleep)]    Past Medical History  Diagnosis Date  . Obesity   . Hyperlipidemia   . Type II or unspecified type diabetes mellitus without mention of complication, not stated as uncontrolled   . Allergy   . Arthritis   . Gout   . Vitamin D deficiency   . Carotid artery stenosis     1-39% right and 40-59% left by dopplers 2015  . Chronic diastolic  CHF (congestive heart failure), NYHA class 1   . Hypertension    Health Maintenance  Topic Date Due  . COLONOSCOPY  02/17/1982  . ZOSTAVAX  02/18/1992  . URINE MICROALBUMIN  02/02/2014  . HEMOGLOBIN A1C  05/13/2014  . INFLUENZA VACCINE  09/05/2014  . OPHTHALMOLOGY EXAM  11/17/2014  . FOOT EXAM  02/24/2015  . TETANUS/TDAP  12/22/2018  . DEXA SCAN  Completed  . PNEUMOCOCCAL POLYSACCHARIDE VACCINE AGE 52 AND OVER  Completed   Immunization History  Administered Date(s) Administered  . Influenza Split 10/23/2012  . Influenza, High Dose Seasonal PF 11/11/2013  . Pneumococcal Conjugate-13 11/11/2013  . Pneumococcal Polysaccharide-23 02/04/2006  . Td 12/21/2008   Past Surgical History  Procedure Laterality Date  . Joint replacement    . Dilitation and  currage     Family History  Problem Relation Age of Onset  . Hypertension Mother   . Stroke Mother   . Asthma Father   . Hypertension Father   . Hyperlipidemia Father   . Heart attack Father   . Diabetes Brother   . Sleep apnea Daughter   . Asthma Son   . Hypertension Son    History  Substance Use Topics  . Smoking status: Never Smoker   . Smokeless tobacco: Never Used  . Alcohol Use: No    ROS Constitutional: Denies fever, chills, weight loss/gain, headaches, insomnia, fatigue, night sweats, and change in appetite. Eyes: Denies redness, blurred vision, diplopia, discharge, itchy, watery eyes.  ENT: Denies discharge, congestion, post nasal drip, epistaxis, sore throat, earache, hearing loss, dental pain, Tinnitus, Vertigo, Sinus pain, snoring.  Cardio: Denies chest pain, palpitations, irregular heartbeat, syncope, dyspnea, diaphoresis, orthopnea, PND, claudication, edema Respiratory: denies cough, dyspnea, DOE, pleurisy, hoarseness, laryngitis, wheezing.  Gastrointestinal: Denies dysphagia, heartburn, reflux, water brash, pain, cramps, nausea, vomiting, bloating, diarrhea, constipation, hematemesis, melena, hematochezia, jaundice, hemorrhoids Genitourinary: Denies dysuria, frequency, urgency, nocturia, hesitancy, discharge, hematuria, flank pain Breast: Breast lumps, nipple discharge, bleeding.  Musculoskeletal: Denies arthralgia, myalgia, stiffness, Jt. Swelling, pain, limp, and strain/sprain. Denies falls. Skin: Denies puritis, rash, hives, warts, acne, eczema, changing in skin lesion Neuro: No weakness, tremor, incoordination, spasms, paresthesia, pain Psychiatric: Denies confusion, memory loss, sensory loss. Denies Depression. Endocrine: Denies change in weight, skin, hair change, nocturia, and paresthesia, diabetic polys, visual blurring, hyper / hypo glycemic episodes.  Heme/Lymph: No excessive bleeding, bruising, enlarged lymph nodes.  Physical Exam  BP 126/70   P 72   T 97.9 F   R 16  Ht 5' 3.5"   Wt 198 lb 12.8 oz     BMI 34.66   General Appearance: Well nourished and in no apparent distress. Eyes: PERRLA, EOMs, conjunctiva no swelling or erythema, normal fundi and vessels. Sinuses: No frontal/maxillary tenderness ENT/Mouth: EACs patent / TMs  nl. Nares clear without erythema, swelling, mucoid exudates. Oral hygiene is good. No erythema, swelling, or exudate. Tongue normal, non-obstructing. Tonsils not swollen or erythematous. Hearing normal.  Neck: Supple, thyroid normal. No bruits, nodes or JVD. Respiratory: Respiratory effort normal.  BS equal and clear bilateral without rales, rhonci, wheezing or stridor. Cardio: Heart sounds are normal with regular rate and rhythm and no murmurs, rubs or gallops. Peripheral pulses are normal and equal bilaterally with 1+ chronic brawny stasis dermatitis & edema. No aortic or femoral bruits. Chest: Mod kyphosis. symmetric with normal excursions and percussion. Breasts: Symmetric, without lumps, nipple discharge, retractions, or fibrocystic changes.  Abdomen: Flat, soft, with bowl sounds. Nontender, no guarding, rebound, hernias, masses,  or organomegaly.  Lymphatics: Non tender without lymphadenopathy.  Genitourinary:  Musculoskeletal: Full ROM all peripheral extremities, joint stability, 5/5 strength, and normal gait. Skin: Warm and dry without rashes, lesions, cyanosis, clubbing or  ecchymosis.  Neuro: Cranial nerves intact, reflexes equal bilaterally. Normal muscle tone, no cerebellar symptoms. Sensation intact.  Pysch: Awake and oriented X 3, normal affect, Insight and Judgment appropriate.   Assessment and Plan  1. Hypertension  2. CKD3 3. T2 _NIDDM 4. Hyperlipidemia 5. Vitamin D Deficiency 6. DJD 7. Gout   Continue prudent diet as discussed, weight control, BP monitoring, regular exercise, and medications. Discussed med's effects and SE's. Screening labs and tests as requested with regular follow-up as  recommended.

## 2014-02-23 NOTE — Patient Instructions (Signed)
Recommend the book "The END of DIETING" by Dr Excell Seltzer   & the book "The END of DIABETES " by Dr Excell Seltzer  At [email or web address] - get book & Audio CD's      Being diabetic has a  300% increased risk for heart attack, stroke, cancer, and alzheimer- type vascular dementia. It is very important that you work harder with diet by avoiding all foods that are white except chicken & fish. Avoid white rice (brown & wild rice is OK), white potatoes (sweetpotatoes in moderation is OK), White bread or wheat bread or anything made out of white flour like bagels, donuts, rolls, buns, biscuits, cakes, pastries, cookies, pizza crust, and pasta (made from white flour & egg whites) - vegetarian pasta or spinach or wheat pasta is OK. Multigrain breads like Arnold's or [employer], or multigrain sandwich thins or flatbreads.  Diet, exercise and weight loss can reverse and cure diabetes in the early stages.  Diet, exercise and weight loss is very important in the control and prevention of complications of diabetes which affects every system in your body, ie. Brain - dementia/stroke, eyes - glaucoma/blindness, heart - heart attack/heart failure, kidneys - dialysis, stomach - gastric paralysis, intestines - malabsorption, nerves - severe painful neuritis, circulation - gangrene & loss of a leg(s), and finally cancer and Alzheimers.    I recommend avoid fried & greasy foods,  sweets/candy, white rice (brown or wild rice or Quinoa is OK), white potatoes (sweet potatoes are OK) - anything made from white flour - bagels, doughnuts, rolls, buns, biscuits,white and wheat breads, pizza crust and traditional pasta made of white flour & egg white(vegetarian pasta or spinach or wheat pasta is OK).  Multi-grain bread is OK - like multi-grain flat bread or sandwich thins. Avoid alcohol in excess. Exercise is also important.    Eat all the vegetables you want - avoid meat, especially red meat and dairy - especially cheese.  Cheese  is the most concentrated form of trans-fats which is the worst thing to clog up our arteries. Veggie cheese is OK which can be found in the fresh produce section at [employer] or [employer] or [employer]  Preventive Care for Adults A healthy lifestyle and preventive care can promote health and wellness. Preventive health guidelines for women include the following key practices.  A routine yearly physical is a good way to check with your health care provider about your health and preventive screening. It is a chance to share any concerns and updates on your health and to receive a thorough exam.  Visit your dentist for a routine exam and preventive care every 6 months. Brush your teeth twice a day and floss once a day. Good oral hygiene prevents tooth decay and gum disease.  The frequency of eye exams is based on your age, health, family medical history, use of contact lenses, and other factors. Follow your health care provider's recommendations for frequency of eye exams.  Eat a healthy diet. Foods like vegetables, fruits, whole grains, low-fat dairy products, and lean protein foods contain the nutrients you need without too many calories. Decrease your intake of foods high in solid fats, added sugars, and salt. Eat the right amount of calories for you.Get information about a proper diet from your health care provider, if necessary.  Regular physical exercise is one of the most important things you can do for your health. Most adults should get at least 150 minutes of moderate-intensity exercise (any activity that increases  your heart rate and causes you to sweat) each week. In addition, most adults need muscle-strengthening exercises on 2 or more days a week.  Maintain a healthy weight. The body mass index (BMI) is a screening tool to identify possible weight problems. It provides an estimate of body fat based on height and weight. Your health care provider can find your BMI and can help you  achieve or maintain a healthy weight.For adults 20 years and older:  A BMI below 18.5 is considered underweight.  A BMI of 18.5 to 24.9 is normal.  A BMI of 25 to 29.9 is considered overweight.  A BMI of 30 and above is considered obese.  Maintain normal blood lipids and cholesterol levels by exercising and minimizing your intake of saturated fat. Eat a balanced diet with plenty of fruit and vegetables. If your lipid or cholesterol levels are high, you are over 50, or you are at high risk for heart disease, you may need your cholesterol levels checked more frequently.Ongoing high lipid and cholesterol levels should be treated with medicines if diet and exercise are not working.  If you smoke, find out from your health care provider how to quit. If you do not use tobacco, do not start.  Lung cancer screening is recommended for adults aged 55-80 years who are at high risk for developing lung cancer because of a history of smoking. A yearly low-dose CT scan of the lungs is recommended for people who have at least a 30-pack-year history of smoking and are a current smoker or have quit within the past 15 years. A pack year of smoking is smoking an average of 1 pack of cigarettes a day for 1 year (for example: 1 pack a day for 30 years or 2 packs a day for 15 years). Yearly screening should continue until the smoker has stopped smoking for at least 15 years. Yearly screening should be stopped for people who develop a health problem that would prevent them from having lung cancer treatment.  Avoid use of street drugs. Do not share needles with anyone. Ask for help if you need support or instructions about stopping the use of drugs.  High blood pressure causes heart disease and increases the risk of stroke.  Ongoing high blood pressure should be treated with medicines if weight loss and exercise do not work.  If you are 55-79 years old, ask your health care provider if you should take aspirin to  prevent strokes.  Diabetes screening involves taking a blood sample to check your fasting blood sugar level. This should be done once every 3 years, after age 45, if you are within normal weight and without risk factors for diabetes. Testing should be considered at a younger age or be carried out more frequently if you are overweight and have at least 1 risk factor for diabetes.  Breast cancer screening is essential preventive care for women. You should practice "breast self-awareness." This means understanding the normal appearance and feel of your breasts and may include breast self-examination. Any changes detected, no matter how small, should be reported to a health care provider. Women in their 20s and 30s should have a clinical breast exam (CBE) by a health care provider as part of a regular health exam every 1 to 3 years. After age 40, women should have a CBE every year. Starting at age 40, women should consider having a mammogram (breast X-ray test) every year. Women who have a family history of breast cancer should   talk to their health care provider about genetic screening. Women at a high risk of breast cancer should talk to their health care providers about having an MRI and a mammogram every year.  Breast cancer gene (BRCA)-related cancer risk assessment is recommended for women who have family members with BRCA-related cancers. BRCA-related cancers include breast, ovarian, tubal, and peritoneal cancers. Having family members with these cancers may be associated with an increased risk for harmful changes (mutations) in the breast cancer genes BRCA1 and BRCA2. Results of the assessment will determine the need for genetic counseling and BRCA1 and BRCA2 testing.  Routine pelvic exams to screen for cancer are no longer recommended for nonpregnant women who are considered low risk for cancer of the pelvic organs (ovaries, uterus, and vagina) and who do not have symptoms. Ask your health care provider  if a screening pelvic exam is right for you.  If you have had past treatment for cervical cancer or a condition that could lead to cancer, you need Pap tests and screening for cancer for at least 20 years after your treatment. If Pap tests have been discontinued, your risk factors (such as having a new sexual partner) need to be reassessed to determine if screening should be resumed. Some women have medical problems that increase the chance of getting cervical cancer. In these cases, your health care provider may recommend more frequent screening and Pap tests.    Colorectal cancer can be detected and often prevented. Most routine colorectal cancer screening begins at the age of 90 years and continues through age 8 years. However, your health care provider may recommend screening at an earlier age if you have risk factors for colon cancer. On a yearly basis, your health care provider may provide home test kits to check for hidden blood in the stool. Use of a small camera at the end of a tube, to directly examine the colon (sigmoidoscopy or colonoscopy), can detect the earliest forms of colorectal cancer. Talk to your health care provider about this at age 86, when routine screening begins. Direct exam of the colon should be repeated every 5-10 years through age 32 years, unless early forms of pre-cancerous polyps or small growths are found.  Osteoporosis is a disease in which the bones lose minerals and strength with aging. This can result in serious bone fractures or breaks. The risk of osteoporosis can be identified using a bone density scan. Women ages 75 years and over and women at risk for fractures or osteoporosis should discuss screening with their health care providers. Ask your health care provider whether you should take a calcium supplement or vitamin D to reduce the rate of osteoporosis.  Menopause can be associated with physical symptoms and risks. Hormone replacement therapy is available to  decrease symptoms and risks. You should talk to your health care provider about whether hormone replacement therapy is right for you.  Use sunscreen. Apply sunscreen liberally and repeatedly throughout the day. You should seek shade when your shadow is shorter than you. Protect yourself by wearing long sleeves, pants, a wide-brimmed hat, and sunglasses year round, whenever you are outdoors.  Once a month, do a whole body skin exam, using a mirror to look at the skin on your back. Tell your health care provider of new moles, moles that have irregular borders, moles that are larger than a pencil eraser, or moles that have changed in shape or color.  Stay current with required vaccines (immunizations).  Influenza vaccine. All adults  should be immunized every year.  Tetanus, diphtheria, and acellular pertussis (Td, Tdap) vaccine. Pregnant women should receive 1 dose of Tdap vaccine during each pregnancy. The dose should be obtained regardless of the length of time since the last dose. Immunization is preferred during the 27th-36th week of gestation. An adult who has not previously received Tdap or who does not know her vaccine status should receive 1 dose of Tdap. This initial dose should be followed by tetanus and diphtheria toxoids (Td) booster doses every 10 years. Adults with an unknown or incomplete history of completing a 3-dose immunization series with Td-containing vaccines should begin or complete a primary immunization series including a Tdap dose. Adults should receive a Td booster every 10 years.    Zoster vaccine. One dose is recommended for adults aged 44 years or older unless certain conditions are present.    Pneumococcal 13-valent conjugate (PCV13) vaccine. When indicated, a person who is uncertain of her immunization history and has no record of immunization should receive the PCV13 vaccine. An adult aged 74 years or older who has certain medical conditions and has not been previously  immunized should receive 1 dose of PCV13 vaccine. This PCV13 should be followed with a dose of pneumococcal polysaccharide (PPSV23) vaccine. The PPSV23 vaccine dose should be obtained at least 8 weeks after the dose of PCV13 vaccine. An adult aged 66 years or older who has certain medical conditions and previously received 1 or more doses of PPSV23 vaccine should receive 1 dose of PCV13. The PCV13 vaccine dose should be obtained 1 or more years after the last PPSV23 vaccine dose.    Pneumococcal polysaccharide (PPSV23) vaccine. When PCV13 is also indicated, PCV13 should be obtained first. All adults aged 89 years and older should be immunized. An adult younger than age 80 years who has certain medical conditions should be immunized. Any person who resides in a nursing home or long-term care facility should be immunized. An adult smoker should be immunized. People with an immunocompromised condition and certain other conditions should receive both PCV13 and PPSV23 vaccines. People with human immunodeficiency virus (HIV) infection should be immunized as soon as possible after diagnosis. Immunization during chemotherapy or radiation therapy should be avoided. Routine use of PPSV23 vaccine is not recommended for American Indians, [place] Natives, or people younger than 65 years unless there are medical conditions that require PPSV23 vaccine. When indicated, people who have unknown immunization and have no record of immunization should receive PPSV23 vaccine. One-time revaccination 5 years after the first dose of PPSV23 is recommended for people aged 19-64 years who have chronic kidney failure, nephrotic syndrome, asplenia, or immunocompromised conditions. People who received 1-2 doses of PPSV23 before age 34 years should receive another dose of PPSV23 vaccine at age 31 years or later if at least 5 years have passed since the previous dose. Doses of PPSV23 are not needed for people immunized with PPSV23 at or after  age 29 years.   Preventive Services / Frequency  Ages 55 years and over  Blood pressure check.  Lipid and cholesterol check.  Lung cancer screening. / Every year if you are aged 29-80 years and have a 30-pack-year history of smoking and currently smoke or have quit within the past 15 years. Yearly screening is stopped once you have quit smoking for at least 15 years or develop a health problem that would prevent you from having lung cancer treatment.  Clinical breast exam.** / Every year after age 40 years.  BRCA-related cancer risk assessment.** / For women who have family members with a BRCA-related cancer (breast, ovarian, tubal, or peritoneal cancers).  Mammogram.** / Every year beginning at age 40 years and continuing for as long as you are in good health. Consult with your health care provider.  Pap test.** / Every 3 years starting at age 30 years through age 65 or 70 years with 3 consecutive normal Pap tests. Testing can be stopped between 65 and 70 years with 3 consecutive normal Pap tests and no abnormal Pap or HPV tests in the past 10 years.  Fecal occult blood test (FOBT) of stool. / Every year beginning at age 50 years and continuing until age 75 years. You may not need to do this test if you get a colonoscopy every 10 years.  Flexible sigmoidoscopy or colonoscopy.** / Every 5 years for a flexible sigmoidoscopy or every 10 years for a colonoscopy beginning at age 50 years and continuing until age 75 years.  Hepatitis C blood test.** / For all people born from 1945 through 1965 and any individual with known risks for hepatitis C.  Osteoporosis screening.** / A one-time screening for women ages 65 years and over and women at risk for fractures or osteoporosis.  Skin self-exam. / Monthly.  Influenza vaccine. / Every year.  Tetanus, diphtheria, and acellular pertussis (Tdap/Td) vaccine.** / 1 dose of Td every 10 years.  Zoster vaccine.** / 1 dose for adults aged 60 years  or older.  Pneumococcal 13-valent conjugate (PCV13) vaccine.** / Consult your health care provider.  Pneumococcal polysaccharide (PPSV23) vaccine.** / 1 dose for all adults aged 65 years and older. Screening for abdominal aortic aneurysm (AAA)  by ultrasound is recommended for people who have history of high blood pressure or who are current or former smokers. 

## 2014-02-24 LAB — HEPATIC FUNCTION PANEL
ALK PHOS: 84 U/L (ref 39–117)
ALT: 11 U/L (ref 0–35)
AST: 19 U/L (ref 0–37)
Albumin: 4.2 g/dL (ref 3.5–5.2)
BILIRUBIN DIRECT: 0.1 mg/dL (ref 0.0–0.3)
Indirect Bilirubin: 0.4 mg/dL (ref 0.2–1.2)
Total Bilirubin: 0.5 mg/dL (ref 0.2–1.2)
Total Protein: 7 g/dL (ref 6.0–8.3)

## 2014-02-24 LAB — URINALYSIS, MICROSCOPIC ONLY
Casts: NONE SEEN
Crystals: NONE SEEN
Squamous Epithelial / LPF: NONE SEEN

## 2014-02-24 LAB — BASIC METABOLIC PANEL WITH GFR
BUN: 34 mg/dL — ABNORMAL HIGH (ref 6–23)
CO2: 26 meq/L (ref 19–32)
Calcium: 10 mg/dL (ref 8.4–10.5)
Chloride: 102 mEq/L (ref 96–112)
Creat: 1.22 mg/dL — ABNORMAL HIGH (ref 0.50–1.10)
GFR, Est African American: 48 mL/min — ABNORMAL LOW
GFR, Est Non African American: 41 mL/min — ABNORMAL LOW
GLUCOSE: 108 mg/dL — AB (ref 70–99)
POTASSIUM: 4.8 meq/L (ref 3.5–5.3)
Sodium: 141 mEq/L (ref 135–145)

## 2014-02-24 LAB — INSULIN, FASTING: INSULIN FASTING, SERUM: 8.4 u[IU]/mL (ref 2.0–19.6)

## 2014-02-24 LAB — URIC ACID: Uric Acid, Serum: 5.1 mg/dL (ref 2.4–7.0)

## 2014-02-24 LAB — LIPID PANEL
CHOL/HDL RATIO: 3.5 ratio
CHOLESTEROL: 173 mg/dL (ref 0–200)
HDL: 50 mg/dL (ref >39)
LDL Cholesterol: 88 mg/dL (ref 0–99)
Triglycerides: 174 mg/dL — ABNORMAL HIGH (ref <150)
VLDL: 35 mg/dL (ref 0–40)

## 2014-02-24 LAB — MICROALBUMIN / CREATININE URINE RATIO
Creatinine, Urine: 87.3 mg/dL
MICROALB/CREAT RATIO: 4.6 mg/g (ref 0.0–30.0)
Microalb, Ur: 0.4 mg/dL (ref <2.0)

## 2014-02-24 LAB — HEMOGLOBIN A1C
Hgb A1c MFr Bld: 6.3 % — ABNORMAL HIGH (ref <5.7)
MEAN PLASMA GLUCOSE: 134 mg/dL — AB (ref <117)

## 2014-02-24 LAB — MAGNESIUM: Magnesium: 2.2 mg/dL (ref 1.5–2.5)

## 2014-02-24 LAB — TSH: TSH: 2.163 u[IU]/mL (ref 0.350–4.500)

## 2014-02-24 LAB — VITAMIN D 25 HYDROXY (VIT D DEFICIENCY, FRACTURES): Vit D, 25-Hydroxy: 52 ng/mL (ref 30–100)

## 2014-02-26 ENCOUNTER — Other Ambulatory Visit: Payer: Self-pay | Admitting: Internal Medicine

## 2014-02-26 DIAGNOSIS — N390 Urinary tract infection, site not specified: Secondary | ICD-10-CM

## 2014-02-26 LAB — URINE CULTURE: Colony Count: >=100000

## 2014-02-26 MED ORDER — AMOXICILLIN 250 MG PO CAPS: ORAL_CAPSULE | ORAL | Status: AC

## 2014-03-07 ENCOUNTER — Other Ambulatory Visit: Payer: Self-pay | Admitting: Internal Medicine

## 2014-03-16 ENCOUNTER — Other Ambulatory Visit: Payer: Self-pay | Admitting: *Deleted

## 2014-03-16 DIAGNOSIS — Z1212 Encounter for screening for malignant neoplasm of rectum: Secondary | ICD-10-CM

## 2014-03-16 LAB — POC HEMOCCULT BLD/STL (HOME/3-CARD/SCREEN)
Card #2 Fecal Occult Blod, POC: NEGATIVE
Card #3 Fecal Occult Blood, POC: NEGATIVE
FECAL OCCULT BLD: NEGATIVE

## 2014-03-23 ENCOUNTER — Encounter: Payer: Self-pay | Admitting: Cardiology

## 2014-03-30 DIAGNOSIS — I10 Essential (primary) hypertension: Secondary | ICD-10-CM | POA: Diagnosis not present

## 2014-03-30 DIAGNOSIS — N390 Urinary tract infection, site not specified: Secondary | ICD-10-CM | POA: Diagnosis not present

## 2014-03-30 DIAGNOSIS — N183 Chronic kidney disease, stage 3 (moderate): Secondary | ICD-10-CM | POA: Diagnosis not present

## 2014-03-30 DIAGNOSIS — E118 Type 2 diabetes mellitus with unspecified complications: Secondary | ICD-10-CM | POA: Diagnosis not present

## 2014-05-09 ENCOUNTER — Other Ambulatory Visit: Payer: Self-pay | Admitting: Internal Medicine

## 2014-06-01 ENCOUNTER — Encounter (HOSPITAL_COMMUNITY): Payer: Self-pay | Admitting: Emergency Medicine

## 2014-06-01 ENCOUNTER — Inpatient Hospital Stay (HOSPITAL_COMMUNITY)
Admission: EM | Admit: 2014-06-01 | Discharge: 2014-06-04 | DRG: 309 | Disposition: A | Discharge status: Home / Self Care | Payer: Medicare Other | Attending: Family Medicine | Admitting: Family Medicine

## 2014-06-01 ENCOUNTER — Emergency Department (HOSPITAL_COMMUNITY): Payer: Medicare Other

## 2014-06-01 DIAGNOSIS — I10 Essential (primary) hypertension: Secondary | ICD-10-CM | POA: Diagnosis not present

## 2014-06-01 DIAGNOSIS — Z823 Family history of stroke: Secondary | ICD-10-CM

## 2014-06-01 DIAGNOSIS — I499 Cardiac arrhythmia, unspecified: Secondary | ICD-10-CM | POA: Diagnosis not present

## 2014-06-01 DIAGNOSIS — I444 Left anterior fascicular block: Secondary | ICD-10-CM | POA: Diagnosis present

## 2014-06-01 DIAGNOSIS — E785 Hyperlipidemia, unspecified: Secondary | ICD-10-CM | POA: Diagnosis present

## 2014-06-01 DIAGNOSIS — I6522 Occlusion and stenosis of left carotid artery: Secondary | ICD-10-CM | POA: Diagnosis present

## 2014-06-01 DIAGNOSIS — M109 Gout, unspecified: Secondary | ICD-10-CM | POA: Diagnosis present

## 2014-06-01 DIAGNOSIS — R079 Chest pain, unspecified: Secondary | ICD-10-CM

## 2014-06-01 DIAGNOSIS — I4891 Unspecified atrial fibrillation: Secondary | ICD-10-CM | POA: Diagnosis not present

## 2014-06-01 DIAGNOSIS — Z833 Family history of diabetes mellitus: Secondary | ICD-10-CM

## 2014-06-01 DIAGNOSIS — Z79899 Other long term (current) drug therapy: Secondary | ICD-10-CM

## 2014-06-01 DIAGNOSIS — K219 Gastro-esophageal reflux disease without esophagitis: Secondary | ICD-10-CM | POA: Diagnosis present

## 2014-06-01 DIAGNOSIS — E1122 Type 2 diabetes mellitus with diabetic chronic kidney disease: Secondary | ICD-10-CM | POA: Diagnosis not present

## 2014-06-01 DIAGNOSIS — E669 Obesity, unspecified: Secondary | ICD-10-CM | POA: Diagnosis present

## 2014-06-01 DIAGNOSIS — R0789 Other chest pain: Secondary | ICD-10-CM | POA: Diagnosis not present

## 2014-06-01 DIAGNOSIS — Z6833 Body mass index (BMI) 33.0-33.9, adult: Secondary | ICD-10-CM | POA: Diagnosis not present

## 2014-06-01 DIAGNOSIS — I480 Paroxysmal atrial fibrillation: Secondary | ICD-10-CM | POA: Diagnosis present

## 2014-06-01 DIAGNOSIS — Z8249 Family history of ischemic heart disease and other diseases of the circulatory system: Secondary | ICD-10-CM | POA: Diagnosis not present

## 2014-06-01 DIAGNOSIS — N183 Chronic kidney disease, stage 3 (moderate): Secondary | ICD-10-CM | POA: Diagnosis present

## 2014-06-01 DIAGNOSIS — I5032 Chronic diastolic (congestive) heart failure: Secondary | ICD-10-CM | POA: Diagnosis not present

## 2014-06-01 DIAGNOSIS — I517 Cardiomegaly: Secondary | ICD-10-CM | POA: Diagnosis not present

## 2014-06-01 DIAGNOSIS — I129 Hypertensive chronic kidney disease with stage 1 through stage 4 chronic kidney disease, or unspecified chronic kidney disease: Secondary | ICD-10-CM | POA: Diagnosis present

## 2014-06-01 DIAGNOSIS — Z825 Family history of asthma and other chronic lower respiratory diseases: Secondary | ICD-10-CM

## 2014-06-01 DIAGNOSIS — E119 Type 2 diabetes mellitus without complications: Secondary | ICD-10-CM | POA: Diagnosis not present

## 2014-06-01 DIAGNOSIS — I959 Hypotension, unspecified: Secondary | ICD-10-CM | POA: Diagnosis present

## 2014-06-01 DIAGNOSIS — Z7982 Long term (current) use of aspirin: Secondary | ICD-10-CM

## 2014-06-01 LAB — CBC
HCT: 38.6 % (ref 36.0–46.0)
Hemoglobin: 12.9 g/dL (ref 12.0–15.0)
MCH: 31.5 pg (ref 26.0–34.0)
MCHC: 33.4 g/dL (ref 30.0–36.0)
MCV: 94.1 fL (ref 78.0–100.0)
Platelets: 196 10*3/uL (ref 150–400)
RBC: 4.1 MIL/uL (ref 3.87–5.11)
RDW: 13.5 % (ref 11.5–15.5)
WBC: 6.5 10*3/uL (ref 4.0–10.5)

## 2014-06-01 LAB — URINALYSIS, ROUTINE W REFLEX MICROSCOPIC
Bilirubin Urine: NEGATIVE
GLUCOSE, UA: NEGATIVE mg/dL
Hgb urine dipstick: NEGATIVE
Ketones, ur: NEGATIVE mg/dL
NITRITE: NEGATIVE
PROTEIN: NEGATIVE mg/dL
SPECIFIC GRAVITY, URINE: 1.006 (ref 1.005–1.030)
Urobilinogen, UA: 0.2 mg/dL (ref 0.0–1.0)
pH: 6 (ref 5.0–8.0)

## 2014-06-01 LAB — I-STAT TROPONIN, ED: TROPONIN I, POC: 0.01 ng/mL (ref 0.00–0.08)

## 2014-06-01 LAB — COMPREHENSIVE METABOLIC PANEL
ALT: 15 U/L (ref 0–35)
AST: 30 U/L (ref 0–37)
Albumin: 4.2 g/dL (ref 3.5–5.2)
Alkaline Phosphatase: 99 U/L (ref 39–117)
Anion gap: 12 (ref 5–15)
BILIRUBIN TOTAL: 0.9 mg/dL (ref 0.3–1.2)
BUN: 47 mg/dL — AB (ref 6–23)
CHLORIDE: 104 mmol/L (ref 96–112)
CO2: 20 mmol/L (ref 19–32)
Calcium: 9.6 mg/dL (ref 8.4–10.5)
Creatinine, Ser: 1.53 mg/dL — ABNORMAL HIGH (ref 0.50–1.10)
GFR calc Af Amer: 35 mL/min — ABNORMAL LOW (ref >90)
GFR calc non Af Amer: 31 mL/min — ABNORMAL LOW (ref >90)
GLUCOSE: 159 mg/dL — AB (ref 70–99)
Potassium: 4.8 mmol/L (ref 3.5–5.1)
SODIUM: 136 mmol/L (ref 135–145)
TOTAL PROTEIN: 7.4 g/dL (ref 6.0–8.3)

## 2014-06-01 LAB — TROPONIN I

## 2014-06-01 LAB — URINE MICROSCOPIC-ADD ON

## 2014-06-01 LAB — PROTIME-INR
INR: 1.07 (ref 0.00–1.49)
Prothrombin Time: 14 seconds (ref 11.6–15.2)

## 2014-06-01 LAB — APTT: aPTT: 42 seconds — ABNORMAL HIGH (ref 24–37)

## 2014-06-01 LAB — CBG MONITORING, ED: Glucose-Capillary: 132 mg/dL — ABNORMAL HIGH (ref 70–99)

## 2014-06-01 MED ORDER — ALPRAZOLAM 0.5 MG PO TABS
0.5000 mg | ORAL_TABLET | Freq: Every evening | ORAL | Status: DC | PRN
  Administered 2014-06-02 – 2014-06-03 (×2): 0.5 mg via ORAL
  Filled 2014-06-01 (×2): qty 2

## 2014-06-01 MED ORDER — INSULIN ASPART 100 UNIT/ML ~~LOC~~ SOLN: 0.0000 [IU] | Freq: Every day | SUBCUTANEOUS | Status: DC

## 2014-06-01 MED ORDER — ACETAMINOPHEN 325 MG PO TABS: 650.0000 mg | ORAL_TABLET | Freq: Four times a day (QID) | ORAL | Status: DC | PRN

## 2014-06-01 MED ORDER — HEPARIN (PORCINE) IN NACL 100-0.45 UNIT/ML-% IJ SOLN
1000.0000 [IU]/h | INTRAMUSCULAR | Status: DC
  Administered 2014-06-01: 1000 [IU]/h via INTRAVENOUS
  Filled 2014-06-01: qty 250

## 2014-06-01 MED ORDER — DILTIAZEM LOAD VIA INFUSION
15.0000 mg | Freq: Once | INTRAVENOUS | Status: AC
  Administered 2014-06-01: 15 mg via INTRAVENOUS
  Filled 2014-06-01: qty 15

## 2014-06-01 MED ORDER — ONDANSETRON HCL 4 MG PO TABS: 4.0000 mg | ORAL_TABLET | Freq: Four times a day (QID) | ORAL | Status: DC | PRN

## 2014-06-01 MED ORDER — DILTIAZEM HCL 100 MG IV SOLR
5.0000 mg/h | INTRAVENOUS | Status: DC
  Administered 2014-06-01: 5 mg/h via INTRAVENOUS
  Administered 2014-06-02: 7.5 mg/h via INTRAVENOUS
  Filled 2014-06-01 (×2): qty 100

## 2014-06-01 MED ORDER — PANTOPRAZOLE SODIUM 40 MG PO TBEC
40.0000 mg | DELAYED_RELEASE_TABLET | Freq: Every day | ORAL | Status: DC
  Administered 2014-06-02 – 2014-06-04 (×3): 40 mg via ORAL
  Filled 2014-06-01 (×3): qty 1

## 2014-06-01 MED ORDER — GABAPENTIN 300 MG PO CAPS
300.0000 mg | ORAL_CAPSULE | Freq: Every day | ORAL | Status: DC
  Administered 2014-06-02 – 2014-06-03 (×3): 300 mg via ORAL
  Filled 2014-06-01 (×4): qty 1

## 2014-06-01 MED ORDER — SODIUM CHLORIDE 0.9 % IV SOLN
1000.0000 mL | INTRAVENOUS | Status: DC
  Administered 2014-06-01: 1000 mL via INTRAVENOUS

## 2014-06-01 MED ORDER — LOSARTAN POTASSIUM 50 MG PO TABS
100.0000 mg | ORAL_TABLET | Freq: Every day | ORAL | Status: DC
  Filled 2014-06-01: qty 2

## 2014-06-01 MED ORDER — ACETAMINOPHEN 650 MG RE SUPP: 650.0000 mg | Freq: Four times a day (QID) | RECTAL | Status: DC | PRN

## 2014-06-01 MED ORDER — SODIUM CHLORIDE 0.9 % IJ SOLN
3.0000 mL | Freq: Two times a day (BID) | INTRAMUSCULAR | Status: DC
  Administered 2014-06-02 – 2014-06-04 (×4): 3 mL via INTRAVENOUS

## 2014-06-01 MED ORDER — ONDANSETRON HCL 4 MG/2ML IJ SOLN: 4.0000 mg | Freq: Four times a day (QID) | INTRAMUSCULAR | Status: DC | PRN

## 2014-06-01 MED ORDER — DOXAZOSIN MESYLATE 4 MG PO TABS
4.0000 mg | ORAL_TABLET | Freq: Every day | ORAL | Status: DC
  Administered 2014-06-03: 4 mg via ORAL
  Filled 2014-06-01 (×2): qty 1

## 2014-06-01 MED ORDER — HEPARIN BOLUS VIA INFUSION
4000.0000 [IU] | Freq: Once | INTRAVENOUS | Status: AC
  Administered 2014-06-01: 4000 [IU] via INTRAVENOUS
  Filled 2014-06-01: qty 4000

## 2014-06-01 MED ORDER — INSULIN ASPART 100 UNIT/ML ~~LOC~~ SOLN
0.0000 [IU] | Freq: Three times a day (TID) | SUBCUTANEOUS | Status: DC
  Administered 2014-06-02 – 2014-06-03 (×4): 1 [IU] via SUBCUTANEOUS
  Administered 2014-06-04: 2 [IU] via SUBCUTANEOUS
  Administered 2014-06-04: 1 [IU] via SUBCUTANEOUS
  Filled 2014-06-01: qty 1

## 2014-06-01 MED ORDER — ASPIRIN 81 MG PO CHEW
324.0000 mg | CHEWABLE_TABLET | Freq: Once | ORAL | Status: AC
  Administered 2014-06-01: 324 mg via ORAL
  Filled 2014-06-01: qty 4

## 2014-06-01 MED ORDER — GEMFIBROZIL 600 MG PO TABS
600.0000 mg | ORAL_TABLET | Freq: Every day | ORAL | Status: DC
  Administered 2014-06-02 – 2014-06-04 (×3): 600 mg via ORAL
  Filled 2014-06-01 (×3): qty 1

## 2014-06-01 MED ORDER — ALLOPURINOL 150 MG HALF TABLET
150.0000 mg | ORAL_TABLET | Freq: Every day | ORAL | Status: DC
  Administered 2014-06-02 – 2014-06-03 (×3): 150 mg via ORAL
  Filled 2014-06-01 (×5): qty 1

## 2014-06-01 NOTE — Progress Notes (Signed)
ANTICOAGULATION CONSULT NOTE - Initial Consult  Pharmacy Consult for heparin Indication: atrial fibrillation  Allergies  Allergen Reactions  . Codeine   . Lisinopril Other (See Comments)    fatigue  . Tylenol Pm Extra [Diphenhydramine-Apap (Sleep)]     Patient Measurements: Weight: 193 lb (87.544 kg), height 63 inches Heparin Dosing Weight: 70 kg  Vital Signs: Temp: 97.9 F (36.6 C) (04/27 1144) BP: 102/57 mmHg (04/27 1645) Pulse Rate: 109 (04/27 1645)  Labs:  Recent Labs  06/01/14 1209  HGB 12.9  HCT 38.6  PLT 196  APTT 42*  LABPROT 14.0  INR 1.07  CREATININE 1.53*    Estimated Creatinine Clearance: 30.1 mL/min (by C-G formula based on Cr of 1.53).   Medical History: Past Medical History  Diagnosis Date  . Obesity   . Hyperlipidemia   . Type II or unspecified type diabetes mellitus without mention of complication, not stated as uncontrolled   . Allergy   . Arthritis   . Gout   . Vitamin D deficiency   . Carotid artery stenosis     1-39% right and 40-59% left by dopplers 2015  . Chronic diastolic CHF (congestive heart failure), NYHA class 1   . Hypertension     Medications:  See med rec  Assessment: Patient's an 81 y.o F with hx HLP, DM, HTN, and diastolic HF who presents to the ED with c/o fatigue and irregular heart beat.  To start heparin for new Afib.  Goal of Therapy:  Heparin level 0.3-0.7 units/ml Monitor platelets by anticoagulation protocol: Yes   Plan:  - heparin 4000 units IV x1 bolus, then drip at 1000 units/hr - check 8 hour heparin level  Mikaiah Stoffer P 06/01/2014,5:23 PM

## 2014-06-01 NOTE — H&P (Signed)
PCP:  Alesia Richards, MD  Cardiology consultunt [facility]  Referring provider : Ward   Chief Complaint:  Irregular pulse  HPI: [name] [name] is a 81 y.o. female   has a past medical history of Obesity; Hyperlipidemia; Type II or unspecified type diabetes mellitus without mention of complication, not stated as uncontrolled; Allergy; Arthritis; Gout; Vitamin D deficiency; Carotid artery stenosis; Chronic diastolic CHF (congestive heart failure), NYHA class 1; and Hypertension.   Presented with bilateral shoulder pain since the morning. Patietn was very fatigued.  Patient checked her pulse and blood pressure home and noted that her pulse was irregular and blood pressure was low 90/55.  She presented to emergency department. This is her first episode has been documented. Daughter states she may have had those episodes before but not sure. Patient is followed by Dr. Radford Pax for history of hypertension and diastolic dysfunction. She have had a low risk nuclear stress test in the past. She has history of diabetes mellitus, and carotid artery stenosis diagnosed in 2015. He is taking aspirin 81 mg a day. Patient denied any chest pain no nausea no vomiting no fevers or chills.  In emergency department cardiology consult has been called. Dr. Mare Ferrari have come down to see the patient. He recommended heparin drip for anticoagulation given elevated ChadVasc score of 7. Patient was started on diltiazem drip blood pressure stabilized lately his blood pressure being 131/95 but initially was low as 77/64 although it's unclear if this is a true reading. Patient creatinine was noted to be elevated 1.53 which is baseline. Chest x-ray showing cardiomegaly is not any acute disease. Patient is to be admitted to step down on diltiazem drip and heparin drip Hospitalist was called for admission for new diagnosis of A. fib with RVR  Review of Systems:    Pertinent positives include:  Discomfort,  irregular heartbeat  Constitutional:  No weight loss, night sweats, Fevers, chills, fatigue, weight loss  HEENT:  No headaches, Difficulty swallowing,Tooth/dental problems,Sore throat,  No sneezing, itching, ear ache, nasal congestion, post nasal drip,  Cardio-vascular:  No chest pain, Orthopnea, PND, anasarca, dizziness, palpitations.no Bilateral lower extremity swelling  GI:  No heartburn, indigestion, abdominal pain, nausea, vomiting, diarrhea, change in bowel habits, loss of appetite, melena, blood in stool, hematemesis Resp:  no shortness of breath at rest. No dyspnea on exertion, No excess mucus, no productive cough, No non-productive cough, No coughing up of blood.No change in color of mucus.No wheezing. Skin:  no rash or lesions. No jaundice GU:  no dysuria, change in color of urine, no urgency or frequency. No straining to urinate.  No flank pain.  Musculoskeletal:  No joint pain or no joint swelling. No decreased range of motion. No back pain.  Psych:  No change in mood or affect. No depression or anxiety. No memory loss.  Neuro: no localizing neurological complaints, no tingling, no weakness, no double vision, no gait abnormality, no slurred speech, no confusion  Otherwise ROS are negative except for above, 10 systems were reviewed  Past Medical History: Past Medical History  Diagnosis Date  . Obesity   . Hyperlipidemia   . Type II or unspecified type diabetes mellitus without mention of complication, not stated as uncontrolled   . Allergy   . Arthritis   . Gout   . Vitamin D deficiency   . Carotid artery stenosis     1-39% right and 40-59% left by dopplers 2015  . Chronic diastolic CHF (congestive heart  failure), NYHA class 1   . Hypertension    Past Surgical History  Procedure Laterality Date  . Joint replacement    . Dilitation and currage       Medications: Prior to Admission medications   Medication Sig Start Date End Date Taking?  Authorizing Provider  allopurinol (ZYLOPRIM) 300 MG tablet Take 150 mg by mouth at bedtime.   Yes Historical Provider, MD  ALPRAZolam Duanne Moron) 1 MG tablet Take 1/2 to 1 tablet at hour of Sleep Patient taking differently: Take 0.5-1 mg by mouth at bedtime as needed for sleep.  12/24/13  Yes Unk Pinto, MD  aspirin EC 81 MG tablet Take 81 mg by mouth every evening.   Yes Historical Provider, MD  atenolol (TENORMIN) 100 MG tablet Take 1/2 to 1 tablet daily for BP as directed Patient taking differently: Take 50 mg by mouth daily.  12/23/13  Yes Unk Pinto, MD  cholecalciferol (VITAMIN D) 1000 UNITS tablet Take 4,000 Units by mouth every morning.    Yes Historical Provider, MD  doxazosin (CARDURA) 8 MG tablet Take 4 mg by mouth at bedtime.   Yes Historical Provider, MD  Flaxseed, Linseed, (FLAX SEED OIL) 1000 MG CAPS Take 1 capsule by mouth every morning.   Yes Historical Provider, MD  furosemide (LASIX) 40 MG tablet TAKE 1 on Monday Wed and Friday   Yes Unk Pinto, MD  gabapentin (NEURONTIN) 300 MG capsule TAKE ONE CAPSULE BY MOUTH EVERY DAY AT BEDTIME 03/07/14  Yes Vicie Mutters, PA-C  gemfibrozil (LOPID) 600 MG tablet Take 1 tablet (600 mg total) by mouth daily. 02/18/14  Yes Sueanne Margarita, MD  glucose blood (FREESTYLE LITE) test strip Use once daily or as directed for fluctuating blood sugars. Dx. 250.00 09/30/13  Yes Vicie Mutters, PA-C  Lancets (FREESTYLE) lancets Check blood glucose 1 time daily.  OF-B51.02 02/07/14  Yes Unk Pinto, MD  loperamide (IMODIUM A-D) 2 MG tablet Take 2 mg by mouth 4 (four) times daily as needed for diarrhea or loose stools.   Yes Historical Provider, MD  loratadine (CLARITIN) 10 MG tablet Take 10 mg by mouth every morning.   Yes Historical Provider, MD  losartan (COZAAR) 100 MG tablet TAKE 1 TABLET (100 MG TOTAL) BY MOUTH DAILY. 05/09/14  Yes Unk Pinto, MD  magnesium oxide (MAG-OX) 400 MG tablet Take 400 mg by mouth daily.   Yes Historical Provider,  MD  omeprazole (PRILOSEC) 20 MG capsule Take 20 mg by mouth every morning.   Yes Historical Provider, MD  ranitidine (ZANTAC) 300 MG tablet Take 1 tablet (300 mg total) by mouth 2 (two) times daily. Patient taking differently: Take 300 mg by mouth as needed for heartburn.  08/31/13  Yes Vicie Mutters, PA-C  vitamin B-12 (CYANOCOBALAMIN) 1000 MCG tablet Take 1,000 mcg by mouth every morning.   Yes Historical Provider, MD  vitamin C (ASCORBIC ACID) 500 MG tablet Take 500 mg by mouth 2 (two) times daily.    Yes Historical Provider, MD    Allergies:   Allergies  Allergen Reactions  . Codeine   . Lisinopril Other (See Comments)    fatigue  . Tylenol Pm Extra [Diphenhydramine-Apap (Sleep)]     Social History:  Ambulatory  cane, no hx of significant falls Lives at home alone,       reports that she has never smoked. She has never used smokeless tobacco. She reports that she does not drink alcohol or use illicit drugs.    Family History: family  history includes Asthma in her father and son; Diabetes in her brother; Heart attack in her father; Hyperlipidemia in her father; Hypertension in her father, mother, and son; Sleep apnea in her daughter; Stroke in her mother.    Physical Exam: Patient Vitals for the past 24 hrs:  BP Temp Temp src Pulse Resp SpO2 Weight  06/01/14 1900 131/95 mmHg - - (!) 121 22 95 % -  06/01/14 1847 109/59 mmHg - - (!) 56 12 96 % -  06/01/14 1737 (!) 118/53 mmHg 97.9 F (36.6 C) Oral 82 15 95 % -  06/01/14 1730 (!) 118/53 mmHg - - - 18 - -  06/01/14 1708 - - - - - - 87.544 kg (193 lb)  06/01/14 1645 102/57 mmHg - - 109 16 95 % -  06/01/14 1630 125/70 mmHg - - (!) 56 17 96 % -  06/01/14 1615 112/73 mmHg - - 75 19 96 % -  06/01/14 1600 123/57 mmHg - - 63 19 97 % -  06/01/14 1545 123/59 mmHg - - (!) 43 14 96 % -  06/01/14 1530 107/66 mmHg - - 92 19 95 % -  06/01/14 1515 126/81 mmHg - - (!) 44 16 97 % -  06/01/14 1455 114/62 mmHg - - 98 22 95 % -  06/01/14  1430 107/65 mmHg - - 95 16 95 % -  06/01/14 1407 127/99 mmHg - - 114 16 96 % -  06/01/14 1345 129/76 mmHg - - 93 14 95 % -  06/01/14 1330 126/84 mmHg - - 95 14 95 % -  06/01/14 1315 121/81 mmHg - - 108 18 94 % -  06/01/14 1300 123/72 mmHg - - 101 18 94 % -  06/01/14 1245 130/75 mmHg - - 108 18 95 % -  06/01/14 1223 119/56 mmHg - - 111 20 95 % -  06/01/14 1144 117/78 mmHg 97.9 F (36.6 C) - (!) 36 20 - -  06/01/14 1143 (!) 77/64 mmHg - - 72 20 97 % 87.544 kg (193 lb)    1. General:  in No Acute distress 2. Psychological: Alert and Oriented 3. Head/ENT:  Dry Mucous Membranes                          Head Non traumatic, neck supple                          Normal Dentition 4. SKIN: normal  Skin turgor,  Skin clean Dry and intact no rash 5. Heart: irregular rate and rhythm no Murmur, Rub or gallop 6. Lungs:  no wheezes mild crackles  At the bases 7. Abdomen: Soft, non-tender, Non distended 8. Lower extremities: no clubbing, cyanosis, no edema 9. Neurologically Grossly intact, moving all 4 extremities equally 10. MSK: Normal range of motion  body mass index is 33.65 kg/(m^2).   Labs on Admission:   Results for orders placed or performed during the hospital encounter of 06/01/14 (from the past 24 hour(s))  APTT     Status: Abnormal   Collection Time: 06/01/14 12:09 PM  Result Value Ref Range   aPTT 42 (H) 24 - 37 seconds  CBC     Status: None   Collection Time: 06/01/14 12:09 PM  Result Value Ref Range   WBC 6.5 4.0 - 10.5 K/uL   RBC 4.10 3.87 - 5.11 MIL/uL   Hemoglobin 12.9 12.0 - 15.0 g/dL  HCT 38.6 36.0 - 46.0 %   MCV 94.1 78.0 - 100.0 fL   MCH 31.5 26.0 - 34.0 pg   MCHC 33.4 30.0 - 36.0 g/dL   RDW 13.5 11.5 - 15.5 %   Platelets 196 150 - 400 K/uL  Comprehensive metabolic panel     Status: Abnormal   Collection Time: 06/01/14 12:09 PM  Result Value Ref Range   Sodium 136 135 - 145 mmol/L   Potassium 4.8 3.5 - 5.1 mmol/L   Chloride 104 96 - 112 mmol/L   CO2 20 19 -  32 mmol/L   Glucose, Bld 159 (H) 70 - 99 mg/dL   BUN 47 (H) 6 - 23 mg/dL   Creatinine, Ser 1.53 (H) 0.50 - 1.10 mg/dL   Calcium 9.6 8.4 - 10.5 mg/dL   Total Protein 7.4 6.0 - 8.3 g/dL   Albumin 4.2 3.5 - 5.2 g/dL   AST 30 0 - 37 U/L   ALT 15 0 - 35 U/L   Alkaline Phosphatase 99 39 - 117 U/L   Total Bilirubin 0.9 0.3 - 1.2 mg/dL   GFR calc non Af Amer 31 (L) >90 mL/min   GFR calc Af Amer 35 (L) >90 mL/min   Anion gap 12 5 - 15  Protime-INR     Status: None   Collection Time: 06/01/14 12:09 PM  Result Value Ref Range   Prothrombin Time 14.0 11.6 - 15.2 seconds   INR 1.07 0.00 - 1.49  I-stat troponin, ED (not at [facility])     Status: None   Collection Time: 06/01/14 12:16 PM  Result Value Ref Range   Troponin i, poc 0.01 0.00 - 0.08 ng/mL   Comment 3            UA not obtained  Lab Results  Component Value Date   HGBA1C 6.3* 02/23/2014    Estimated Creatinine Clearance: 30.1 mL/min (by C-G formula based on Cr of 1.53).  BNP (last 3 results)  Recent Labs  06/30/13 0942  PROBNP 72.0    Other results:  I have pearsonaly reviewed this: ECG REPORT  Rate: 120  Rhythm: A. fib with RVR with left fascicular block ST&T Change: No ischemic changes   Filed Weights   06/01/14 1143 06/01/14 1708  Weight: 87.544 kg (193 lb) 87.544 kg (193 lb)     Cultures:    Component Value Date/Time   SDES URINE, RANDOM 03/30/2010 1637   SPECREQUEST NONE 03/30/2010 1637   CULT  03/30/2010 1637    LACTOBACILLUS SPECIES Note: Standardized susceptibility testing for this organism is not available.   REPTSTATUS 04/01/2010 FINAL 03/30/2010 1637     Radiological Exams on Admission: Dg Chest Portable 1 View  06/01/2014   CLINICAL DATA:  Bilateral shoulder and arm pain beginning today. Irregular heart rate today.  EXAM: PORTABLE CHEST - 1 VIEW  COMPARISON:  PA and lateral chest 05/09/2012.  FINDINGS: There is cardiomegaly without edema. Lungs are clear. No pneumothorax or pleural effusion.   IMPRESSION: Cardiomegaly without acute disease.   Electronically Signed   By: Inge Rise M.D.   On: 06/01/2014 12:38    Chart has been reviewed  Family not at  Bedside  plan of care was discussed with Daughter, [phone] MPOA  Assessment/Plan 81 year old female with new onset of A. fib relation with RVR. A chest score of 7 being currently anticoagulated. Cardiology is aware and consulting. Risk factors include history of diabetes, diastolic heart failure and hypertension  Present on Admission:  .  Atrial fibrillation with RVR - admit to step down on diltiazem drip. Currently her blood pressure stable. Obtain echogram in the morning. Given the patient currently on diltiazem will hold atenolol to avoid double blockade. We'll cycle cardiac enzymes obtain serial EKG and echogram in the morning. Patient will need to be on long-term anticoagulation denies history of falls  . Chronic diastolic CHF (congestive heart failure), NYHA class 1 chronic given low blood pressures today will hold Lasix for tonight. Please restart in the morning if it is any evidence of fluid overload. Patient is on valsartan  . CKD stage 3 due to type 2 diabetes mellitus stable continue home medications  . Essential hypertension continue home medications but according parameters holding atenolol while on calcium channel blocker Diabetes diet controlled sliding scale ordered while inpatient  Prophylaxis: on heparin  CODE STATUS:  FULL CODE as per patient  Disposition:   To home once workup is complete and patient is stable would benefit from PT evaluation prior to eval  Other plan as per orders.  I have spent a total of 55 min on this admission  Keymoni Mccaster 06/01/2014, 7:47 PM  [facility]  Pager [phone]   after 2 AM please page floor coverage PA If 7AM-7PM, please contact the day team taking care of the patient  [email or web address]  Password [email or web address]

## 2014-06-01 NOTE — ED Notes (Signed)
Contact info: Daughter Takira Sherrin [phone]

## 2014-06-01 NOTE — ED Notes (Signed)
Pt reports she began to have bilateral shoulder and neck pain that began this am. Pt had irrregular HR on monitor upon arrival, no hx of irregular HR. Pt was also initially hypotensive in triage. Pt has felt weak for the past few days with increased drowsiness this am.

## 2014-06-01 NOTE — Consult Note (Addendum)
CARDIOLOGY CONSULT NOTE   Patient ID: [name] [name] MRN: [id number], DOB/AGE: [birth year]   Admit date: 06/01/2014 Date of Consult: 06/01/2014   Primary Physician: Alesia Richards, MD Primary Cardiologist: Dr. Fransico Him  Pt. Profile  81 year old Caucasian female admitted with atrial fibrillation.  Past history of hypertension and dyslipidemia.  Past history of chronic diastolic heart failure  Problem List  Past Medical History  Diagnosis Date  . Obesity   . Hyperlipidemia   . Type II or unspecified type diabetes mellitus without mention of complication, not stated as uncontrolled   . Allergy   . Arthritis   . Gout   . Vitamin D deficiency   . Carotid artery stenosis     1-39% right and 40-59% left by dopplers 2015  . Chronic diastolic CHF (congestive heart failure), NYHA class 1   . Hypertension     Past Surgical History  Procedure Laterality Date  . Joint replacement    . Dilitation and currage       Allergies  Allergies  Allergen Reactions  . Codeine   . Lisinopril Other (See Comments)    fatigue  . Tylenol Pm Extra [Diphenhydramine-Apap (Sleep)]     HPI   This 81 year old woman came to the emergency room because of an irregular pulse.  She noted the onset of the irregular pulse this morning.  She felt a tightness across her neck and shoulders.  She had difficulty getting her blood pressure machine to work because of the irregular pulse.  She does not have any history of previous documented atrial fibrillation but in retrospect she and her daughter think that perhaps she has had some brief spells in the past that were not recognized.  She does not have any history of known ischemic heart disease.  She had a previous nuclear stress test which showed a very small area of reduced uptake in the basal inferolateral wall that Dr. Radford Pax felt most likely represented breast attenuation.  It was felt to be a low risk study.  She has a history of hypertension which  is well-controlled on blocker/ARB and doxazosin.  She has a history of moderate carotid artery stenosis by Doppler.  She is on aspirin.  She has not been experiencing any TIA symptoms.  She does not have any history of a stroke.  Her family history is notable in that both of her sons have atrial fibrillation.  One of her sons has had atrial fibrillation ablation.  Inpatient Medications     Family History Family History  Problem Relation Age of Onset  . Hypertension Mother   . Stroke Mother   . Asthma Father   . Hypertension Father   . Hyperlipidemia Father   . Heart attack Father   . Diabetes Brother   . Sleep apnea Daughter   . Asthma Son   . Hypertension Son      Social History History   Social History  . Marital Status: Widowed    Spouse Name: N/A  . Number of Children: N/A  . Years of Education: N/A   Occupational History  . Not on file.   Social History Main Topics  . Smoking status: Never Smoker   . Smokeless tobacco: Never Used  . Alcohol Use: No  . Drug Use: No  . Sexual Activity: No   Other Topics Concern  . Not on file   Social History Narrative     Review of Systems  General:  No chills, fever, night sweats  or weight changes.  Cardiovascular:  No chest pain, dyspnea on exertion, edema, orthopnea, palpitations, paroxysmal nocturnal dyspnea. Dermatological: No rash, lesions/masses Respiratory: No cough, dyspnea Urologic: No hematuria, dysuria Abdominal:   No nausea, vomiting, diarrhea, bright red blood per rectum, melena, or hematemesis Neurologic:  No visual changes, wkns, changes in mental status. All other systems reviewed and are otherwise negative except as noted above.  Physical Exam  Blood pressure 123/59, pulse 43, temperature 97.9 F (36.6 C), resp. rate 14, weight 193 lb (87.544 kg), SpO2 96 %.  General: Pleasant, NAD Psych: Normal affect. Neuro: Alert and oriented X 3. Moves all extremities spontaneously. HEENT: Normal  Neck: Supple  without bruits or JVD. Lungs:  Resp regular and unlabored, CTA. Heart: Irregularly irregular no s3, s4, or murmurs. Abdomen: Soft, non-tender, non-distended, BS + x 4.  Extremities: No clubbing, cyanosis or edema. DP/PT/Radials 2+ and equal bilaterally.  Labs  No results for input(s): CKTOTAL, CKMB, TROPONINI in the last 72 hours. Lab Results  Component Value Date   WBC 6.5 06/01/2014   HGB 12.9 06/01/2014   HCT 38.6 06/01/2014   MCV 94.1 06/01/2014   PLT 196 06/01/2014    Recent Labs Lab 06/01/14 1209  NA 136  K 4.8  CL 104  CO2 20  BUN 47*  CREATININE 1.53*  CALCIUM 9.6  PROT 7.4  BILITOT 0.9  ALKPHOS 99  ALT 15  AST 30  GLUCOSE 159*   Lab Results  Component Value Date   CHOL 173 02/23/2014   HDL 50 02/23/2014   LDLCALC 88 02/23/2014   TRIG 174* 02/23/2014   No results found for: DDIMER  Radiology/Studies  Dg Chest Portable 1 View  06/01/2014   CLINICAL DATA:  Bilateral shoulder and arm pain beginning today. Irregular heart rate today.  EXAM: PORTABLE CHEST - 1 VIEW  COMPARISON:  PA and lateral chest 05/09/2012.  FINDINGS: There is cardiomegaly without edema. Lungs are clear. No pneumothorax or pleural effusion.  IMPRESSION: Cardiomegaly without acute disease.   Electronically Signed   By: Inge Rise M.D.   On: 06/01/2014 12:38    ECG  Atrial fibrillation Left anterior fascicular block Consider anterior infarct atrial fibrillation is new since last tracing Confirmed by KNAPP MD-J, JON ([id number]) on 06/01/2014 12:28:24 PM Personally reviewed. ASSESSMENT AND PLAN  1.  Newly recognized atrial fibrillation.  She thinks that she may have had brief episodes in the past.  Her ChaddsVasc score is 7 for congestive heart failure, hypertension, age greater than 40, diabetes, vascular disease (carotid stenosis) and female sex.  She will need long-term anticoagulation.  Will start IV heparin now.  Would probably lean toward a NOAC at discharge.  Would continue home  beta blocker and continue IV diltiazem overnight and consider switch to oral diltiazem tomorrow.  Would check echocardiogram.  Consider outpatient cardioversion after 4 weeks of adequate anticoagulation. 2.  Shoulder discomfort.  Previously normal Myoview.  Would cycle enzymes 3.  Essential hypertension 4.  History of diastolic heart failure by prior echo  Will follow with you  Signed, Darlin Coco, MD  06/01/2014, 4:49 PM

## 2014-06-01 NOTE — ED Provider Notes (Signed)
CSN: [id number]     Arrival date & time 06/01/14  1136 History  First MD Initiated Contact with Patient 06/01/14 1147     Chief Complaint  Patient presents with  . Fatigue  . Irregular Heart Beat   HPI Patient presents to the emergency room with complaints of eye shut lateral shoulder pain that radiates to her neck. The pain is a pressure-type discomfort.  Patient states she was feeling well until she noticed the symptoms today. When it first started she took her blood pressure and noted that her heart was irregular but her blood pressure was little low in the 90s. She tried to make herself a cup of coffee but the symptoms persisted. Patient denies any history of heart disease. She does not have any history of irregular heart rhythms. He denies any trouble with fevers or chills. No cough no swelling. Past Medical History  Diagnosis Date  . Obesity   . Hyperlipidemia   . Type II or unspecified type diabetes mellitus without mention of complication, not stated as uncontrolled   . Allergy   . Arthritis   . Gout   . Vitamin D deficiency   . Carotid artery stenosis     1-39% right and 40-59% left by dopplers 2015  . Chronic diastolic CHF (congestive heart failure), NYHA class 1   . Hypertension    Past Surgical History  Procedure Laterality Date  . Joint replacement    . Dilitation and currage     Family History  Problem Relation Age of Onset  . Hypertension Mother   . Stroke Mother   . Asthma Father   . Hypertension Father   . Hyperlipidemia Father   . Heart attack Father   . Diabetes Brother   . Sleep apnea Daughter   . Asthma Son   . Hypertension Son    History  Substance Use Topics  . Smoking status: Never Smoker   . Smokeless tobacco: Never Used  . Alcohol Use: No   OB History    No data available     Review of Systems  All other systems reviewed and are negative.     Allergies  Codeine; Lisinopril; and Tylenol pm extra  Home Medications   Prior to  Admission medications   Medication Sig Start Date End Date Taking? Authorizing Provider  allopurinol (ZYLOPRIM) 300 MG tablet Take 150 mg by mouth at bedtime.   Yes Historical Provider, MD  ALPRAZolam Duanne Moron) 1 MG tablet Take 1/2 to 1 tablet at hour of Sleep Patient taking differently: Take 0.5-1 mg by mouth at bedtime as needed for sleep.  12/24/13  Yes Unk Pinto, MD  aspirin EC 81 MG tablet Take 81 mg by mouth every evening.   Yes Historical Provider, MD  atenolol (TENORMIN) 100 MG tablet Take 1/2 to 1 tablet daily for BP as directed Patient taking differently: Take 50 mg by mouth daily.  12/23/13  Yes Unk Pinto, MD  cholecalciferol (VITAMIN D) 1000 UNITS tablet Take 4,000 Units by mouth every morning.    Yes Historical Provider, MD  doxazosin (CARDURA) 8 MG tablet Take 4 mg by mouth at bedtime.   Yes Historical Provider, MD  Flaxseed, Linseed, (FLAX SEED OIL) 1000 MG CAPS Take 1 capsule by mouth every morning.   Yes Historical Provider, MD  furosemide (LASIX) 40 MG tablet TAKE 1 on Monday Wed and Friday   Yes Unk Pinto, MD  gabapentin (NEURONTIN) 300 MG capsule TAKE ONE CAPSULE BY MOUTH EVERY DAY  AT BEDTIME 03/07/14  Yes Vicie Mutters, PA-C  gemfibrozil (LOPID) 600 MG tablet Take 1 tablet (600 mg total) by mouth daily. 02/18/14  Yes Sueanne Margarita, MD  glucose blood (FREESTYLE LITE) test strip Use once daily or as directed for fluctuating blood sugars. Dx. 250.00 09/30/13  Yes Vicie Mutters, PA-C  Lancets (FREESTYLE) lancets Check blood glucose 1 time daily.  DD-U20.25 02/07/14  Yes Unk Pinto, MD  loperamide (IMODIUM A-D) 2 MG tablet Take 2 mg by mouth 4 (four) times daily as needed for diarrhea or loose stools.   Yes Historical Provider, MD  loratadine (CLARITIN) 10 MG tablet Take 10 mg by mouth every morning.   Yes Historical Provider, MD  losartan (COZAAR) 100 MG tablet TAKE 1 TABLET (100 MG TOTAL) BY MOUTH DAILY. 05/09/14  Yes Unk Pinto, MD  magnesium oxide (MAG-OX)  400 MG tablet Take 400 mg by mouth daily.   Yes Historical Provider, MD  omeprazole (PRILOSEC) 20 MG capsule Take 20 mg by mouth every morning.   Yes Historical Provider, MD  ranitidine (ZANTAC) 300 MG tablet Take 1 tablet (300 mg total) by mouth 2 (two) times daily. Patient taking differently: Take 300 mg by mouth as needed for heartburn.  08/31/13  Yes Vicie Mutters, PA-C  vitamin B-12 (CYANOCOBALAMIN) 1000 MCG tablet Take 1,000 mcg by mouth every morning.   Yes Historical Provider, MD  vitamin C (ASCORBIC ACID) 500 MG tablet Take 500 mg by mouth 2 (two) times daily.    Yes Historical Provider, MD   BP 121/81 mmHg  Pulse 108  Temp(Src) 97.9 F (36.6 C)  Resp 18  Wt 193 lb (87.544 kg)  SpO2 94% Physical Exam  Constitutional: She appears well-developed and well-nourished. No distress.  HENT:  Head: Normocephalic and atraumatic.  Right Ear: External ear normal.  Left Ear: External ear normal.  Eyes: Conjunctivae are normal. Right eye exhibits no discharge. Left eye exhibits no discharge. No scleral icterus.  Neck: Neck supple. No tracheal deviation present.  Cardiovascular: Normal rate and intact distal pulses.  An irregularly irregular rhythm present.  Pulmonary/Chest: Effort normal and breath sounds normal. No stridor. No respiratory distress. She has no wheezes. She has no rales.  Abdominal: Soft. Bowel sounds are normal. She exhibits no distension. There is no tenderness. There is no rebound and no guarding.  Musculoskeletal: She exhibits edema (mild bilateral ankles). She exhibits no tenderness.  Neurological: She is alert. She has normal strength. No cranial nerve deficit (no facial droop, extraocular movements intact, no slurred speech) or sensory deficit. She exhibits normal muscle tone. She displays no seizure activity. Coordination normal.  Skin: Skin is warm and dry. No rash noted.  Psychiatric: She has a normal mood and affect.  Nursing note and vitals reviewed.   ED Course   Procedures (including critical care time) CRITICAL CARE Performed by: KYHCW,CBJ Total critical care time: 35 Critical care time was exclusive of separately billable procedures and treating other patients. Critical care was necessary to treat or prevent imminent or life-threatening deterioration. Critical care was time spent personally by me on the following activities: development of treatment plan with patient and/or surrogate as well as nursing, discussions with consultants, evaluation of patient's response to treatment, examination of patient, obtaining history from patient or surrogate, ordering and performing treatments and interventions, ordering and review of laboratory studies, ordering and review of radiographic studies, pulse oximetry and re-evaluation of patient's condition.  Labs Review Labs Reviewed  APTT - Abnormal; Notable for the  following:    aPTT 42 (*)    All other components within normal limits  COMPREHENSIVE METABOLIC PANEL - Abnormal; Notable for the following:    Glucose, Bld 159 (*)    BUN 47 (*)    Creatinine, Ser 1.53 (*)    GFR calc non Af Amer 31 (*)    GFR calc Af Amer 35 (*)    All other components within normal limits  CBC  PROTIME-INR  I-STAT TROPOININ, ED    Imaging Review Dg Chest Portable 1 View  06/01/2014   CLINICAL DATA:  Bilateral shoulder and arm pain beginning today. Irregular heart rate today.  EXAM: PORTABLE CHEST - 1 VIEW  COMPARISON:  PA and lateral chest 05/09/2012.  FINDINGS: There is cardiomegaly without edema. Lungs are clear. No pneumothorax or pleural effusion.  IMPRESSION: Cardiomegaly without acute disease.   Electronically Signed   By: Inge Rise M.D.   On: 06/01/2014 12:38     EKG Interpretation   Date/Time:  Wednesday June 01 2014 11:49:51 EDT Ventricular Rate:  120 PR Interval:    QRS Duration: 101 QT Interval:  341 QTC Calculation: 482 R Axis:   -55 Text Interpretation:  Atrial fibrillation Left anterior  fascicular block  Consider anterior infarct atrial fibrillation is new since last tracing  Confirmed by Faelyn Sigler  MD-J, Raygan Skarda ([id number]) on 06/01/2014 12:28:24 PM     Medications  0.9 %  sodium chloride infusion (1,000 mLs Intravenous New Bag/Given 06/01/14 1229)  diltiazem (CARDIZEM) 1 mg/mL load via infusion 15 mg (15 mg Intravenous Given 06/01/14 1230)    And  diltiazem (CARDIZEM) 100 mg in dextrose 5 % 100 mL (1 mg/mL) infusion (7.5 mg/hr Intravenous Rate/Dose Change 06/01/14 1407)  aspirin chewable tablet 324 mg (324 mg Oral Given 06/01/14 1225)    MDM   Final diagnoses:  Atrial fibrillation, rapid  Chest pain, unspecified chest pain type    Patient presents to the emergency room with chest pain associated with a rapid heart rate. She has new onset atrial fibrillation. Her symptoms have improved with rate control but she continues to have some discomfort in her shoulders. A consult with the cardiology service. Anticipate admission for further evaluation, serial cardiac enzymes.    Dorie Rank, MD 06/01/14 1409

## 2014-06-01 NOTE — ED Notes (Signed)
Patient urinated, 400cc

## 2014-06-02 ENCOUNTER — Other Ambulatory Visit (HOSPITAL_COMMUNITY): Payer: Self-pay

## 2014-06-02 ENCOUNTER — Ambulatory Visit: Payer: Self-pay | Admitting: Physician Assistant

## 2014-06-02 DIAGNOSIS — M109 Gout, unspecified: Secondary | ICD-10-CM

## 2014-06-02 DIAGNOSIS — K219 Gastro-esophageal reflux disease without esophagitis: Secondary | ICD-10-CM

## 2014-06-02 DIAGNOSIS — I4891 Unspecified atrial fibrillation: Secondary | ICD-10-CM

## 2014-06-02 DIAGNOSIS — I10 Essential (primary) hypertension: Secondary | ICD-10-CM

## 2014-06-02 DIAGNOSIS — I480 Paroxysmal atrial fibrillation: Secondary | ICD-10-CM | POA: Diagnosis present

## 2014-06-02 LAB — CBC
HEMATOCRIT: 38.4 % (ref 36.0–46.0)
HEMOGLOBIN: 12.7 g/dL (ref 12.0–15.0)
MCH: 31 pg (ref 26.0–34.0)
MCHC: 33.1 g/dL (ref 30.0–36.0)
MCV: 93.7 fL (ref 78.0–100.0)
Platelets: 196 10*3/uL (ref 150–400)
RBC: 4.1 MIL/uL (ref 3.87–5.11)
RDW: 13.5 % (ref 11.5–15.5)
WBC: 6.8 10*3/uL (ref 4.0–10.5)

## 2014-06-02 LAB — PHOSPHORUS: PHOSPHORUS: 4 mg/dL (ref 2.3–4.6)

## 2014-06-02 LAB — TSH: TSH: 2.644 u[IU]/mL (ref 0.350–4.500)

## 2014-06-02 LAB — MAGNESIUM: Magnesium: 2.1 mg/dL (ref 1.5–2.5)

## 2014-06-02 LAB — BRAIN NATRIURETIC PEPTIDE: B Natriuretic Peptide: 520.6 pg/mL — ABNORMAL HIGH (ref 0.0–100.0)

## 2014-06-02 LAB — COMPREHENSIVE METABOLIC PANEL
ALBUMIN: 3.7 g/dL (ref 3.5–5.2)
ALT: 14 U/L (ref 0–35)
AST: 23 U/L (ref 0–37)
Alkaline Phosphatase: 85 U/L (ref 39–117)
Anion gap: 9 (ref 5–15)
BUN: 44 mg/dL — AB (ref 6–23)
CO2: 22 mmol/L (ref 19–32)
CREATININE: 1.49 mg/dL — AB (ref 0.50–1.10)
Calcium: 9 mg/dL (ref 8.4–10.5)
Chloride: 103 mmol/L (ref 96–112)
GFR calc non Af Amer: 32 mL/min — ABNORMAL LOW (ref >90)
GFR, EST AFRICAN AMERICAN: 37 mL/min — AB (ref >90)
GLUCOSE: 121 mg/dL — AB (ref 70–99)
Potassium: 3.8 mmol/L (ref 3.5–5.1)
SODIUM: 134 mmol/L — AB (ref 135–145)
TOTAL PROTEIN: 6.5 g/dL (ref 6.0–8.3)
Total Bilirubin: 0.7 mg/dL (ref 0.3–1.2)

## 2014-06-02 LAB — CBG MONITORING, ED
Glucose-Capillary: 102 mg/dL — ABNORMAL HIGH (ref 70–99)
Glucose-Capillary: 150 mg/dL — ABNORMAL HIGH (ref 70–99)
Glucose-Capillary: 132 mg/dL — ABNORMAL HIGH (ref 70–99)

## 2014-06-02 LAB — TROPONIN I: Troponin I: <0.03 ng/mL (ref <0.031)

## 2014-06-02 LAB — GLUCOSE, CAPILLARY: Glucose-Capillary: 143 mg/dL — ABNORMAL HIGH (ref 70–99)

## 2014-06-02 LAB — HEPARIN LEVEL (UNFRACTIONATED): HEPARIN UNFRACTIONATED: 0.63 [IU]/mL (ref 0.30–0.70)

## 2014-06-02 MED ORDER — APIXABAN 2.5 MG PO TABS
2.5000 mg | ORAL_TABLET | Freq: Two times a day (BID) | ORAL | Status: DC
  Administered 2014-06-02 – 2014-06-04 (×5): 2.5 mg via ORAL
  Filled 2014-06-02 (×6): qty 1

## 2014-06-02 MED ORDER — DILTIAZEM HCL ER COATED BEADS 120 MG PO CP24
120.0000 mg | ORAL_CAPSULE | Freq: Every day | ORAL | Status: DC
  Administered 2014-06-02: 120 mg via ORAL
  Filled 2014-06-02: qty 1

## 2014-06-02 MED ORDER — LOSARTAN POTASSIUM 50 MG PO TABS
50.0000 mg | ORAL_TABLET | Freq: Every day | ORAL | Status: DC
  Administered 2014-06-03 – 2014-06-04 (×2): 50 mg via ORAL
  Filled 2014-06-02 (×2): qty 1

## 2014-06-02 NOTE — ED Notes (Signed)
Spoke with hospitalist regarding bed for pt., Doctor does not feel comfortable downgrading pt. At this time. Will come and reassess pt at a later time.

## 2014-06-02 NOTE — Progress Notes (Signed)
Patient reviewed and reassessed  stable with HR below 130's at rest.  tolerating PO well No n/v/cp No dizzyness There is a lack of SDU and ICU beds and given her clinical stability, I have changed her to a telemetry bed assignment.    Verneita Griffes, MD [facility] [phone]

## 2014-06-02 NOTE — ED Notes (Signed)
EKG given to EDP,Yelverton,MD., for review for Hospitalist.

## 2014-06-02 NOTE — Progress Notes (Signed)
ANTICOAGULATION CONSULT NOTE - Initial Consult  Pharmacy Consult for Eliquis Indication: atrial fibrillation  Allergies  Allergen Reactions  . Codeine   . Lisinopril Other (See Comments)    fatigue  . Tylenol Pm Extra [Diphenhydramine-Apap (Sleep)]     Patient Measurements: Weight: 193 lb (87.544 kg)  Vital Signs: BP: 113/69 mmHg (04/28 0800) Pulse Rate: 121 (04/28 0825)  Labs:  Recent Labs  06/01/14 1209 06/01/14 2045 06/02/14 0224 06/02/14 0225 06/02/14 0505 06/02/14 0834  HGB 12.9  --   --   --  12.7  --   HCT 38.6  --   --   --  38.4  --   PLT 196  --   --   --  196  --   APTT 42*  --   --   --   --   --   LABPROT 14.0  --   --   --   --   --   INR 1.07  --   --   --   --   --   HEPARINUNFRC  --   --  0.63  --   --   --   CREATININE 1.53*  --   --   --  1.49*  --   TROPONINI  --  <0.03  --  <0.03  --  <0.03    Estimated Creatinine Clearance: 30.9 mL/min (by C-G formula based on Cr of 1.49).   Medical History: Past Medical History  Diagnosis Date  . Obesity   . Hyperlipidemia   . Type II or unspecified type diabetes mellitus without mention of complication, not stated as uncontrolled   . Allergy   . Arthritis   . Gout   . Vitamin D deficiency   . Carotid artery stenosis     1-39% right and 40-59% left by dopplers 2015  . Chronic diastolic CHF (congestive heart failure), NYHA class 1   . Hypertension     Medications:   (Not in a hospital admission) Scheduled:  . allopurinol  150 mg Oral QHS  . apixaban  2.5 mg Oral BID  . diltiazem  120 mg Oral Daily  . doxazosin  4 mg Oral QHS  . gabapentin  300 mg Oral QHS  . gemfibrozil  600 mg Oral Daily  . insulin aspart  0-5 Units Subcutaneous QHS  . insulin aspart  0-9 Units Subcutaneous TID WC  . losartan  100 mg Oral Daily  . pantoprazole  40 mg Oral Daily  . sodium chloride  3 mL Intravenous Q12H   Infusions:  . sodium chloride 1,000 mL (06/01/14 1229)   PRN: acetaminophen **OR**  acetaminophen, ALPRAZolam, ondansetron **OR** ondansetron (ZOFRAN) IV  Assessment: 84yoF with PMH HLD, DM, HTN, and diastolic CHF who presents to the ED with c/o fatigue and irregular heart beat. Started on IV UHF 4/27 for new Afib, to transition to Eliquis today per cardiology.   Baseline INR, aPTT: wnl  Prior anticoagulation: ASA 81 mg at home  Baseline SCr appears to be ~1.5 most recently  [place] 7  Today, 06/02/2014:  CBC: wnl  No bleeding issues per nursing  SCr 1.49  Goal of Therapy: Prevention of thromboembolism  Plan:  Eliquis 2.5 mg PO bid.  Age > 80 and SCr is borderline for dose reduction.  CBC, SCr at least q72 hr while on Eliquis  Monitor for signs of bleeding or thrombosis   Reuel Boom, PharmD Pager: [phone] 06/02/2014, 9:14 AM

## 2014-06-02 NOTE — ED Notes (Signed)
Echocardiogram at bedside.

## 2014-06-02 NOTE — Progress Notes (Signed)
Echocardiogram 2D Echocardiogram has been performed.  [name] 06/02/2014, 3:49 PM

## 2014-06-02 NOTE — Progress Notes (Signed)
[name] [name] BOF:[id number] DOB: [birth year] DOA: 06/01/2014 PCP: Alesia Richards, MD  Brief narrative: 81 y/o ? ty 2 DM, HLD, Allergies, Chr Dias HF-NYHA class 1, ckd III, Last Cardiolyte Stress neg, L carotid stenosis noted 2015 admitted ON 06/01/14 c Sholder pain, CP--Found to be in Afib c RVR-CHad2Vasc2= 7 Cardiology consulted in the ED and made recommnedatiosn Echo pending  Past medical history-As per Problem list Chart reviewed as below-   Consultants:  Cardiology  Procedures:  Echo Pending  Antibiotics:     Subjective  doing fair. Alert pleasant and oriented No chest pain no shortness of breath Slightly hypotensive this morning and Cardizem gtt. turned off however on standing and ambulating blood pressure reasonable and heart rate went to the 120s to 130s   Objective    Interim History:   Telemetry: sinus tach-A. fib   Objective: Filed Vitals:   06/02/14 0855 06/02/14 0900 06/02/14 0915 06/02/14 0920  BP:  100/62    Pulse:   123 113  Temp:      TempSrc:      Resp: 15 18 34 21  Weight:      SpO2:   94% 96%    Intake/Output Summary (Last 24 hours) at 06/02/14 0925 Last data filed at 06/02/14 0919  Gross per 24 hour  Intake      0 ml  Output    800 ml  Net   -800 ml    Exam:  General: alert pleasant oriented no apparent distress Cardiovascular: S1-S2 tachycardiccannot appreciate murmur rub gallop or bruit Respiratory: clinically clear no added sound, Abdomen: soft nontender nondistended no rebound nor guarding Skinsoft supple no lower extremity edema Neurorange of motion intact  Data Reviewed: Basic Metabolic Panel:  Recent Labs Lab 06/01/14 1209 06/02/14 0505  NA 136 134*  K 4.8 3.8  CL 104 103  CO2 20 22  GLUCOSE 159* 121*  BUN 47* 44*  CREATININE 1.53* 1.49*  CALCIUM 9.6 9.0  MG  --  2.1  PHOS  --  4.0   Liver Function Tests:  Recent Labs Lab 06/01/14 1209 06/02/14 0505  AST 30 23  ALT 15 14  ALKPHOS 99 85    BILITOT 0.9 0.7  PROT 7.4 6.5  ALBUMIN 4.2 3.7   No results for input(s): LIPASE, AMYLASE in the last 168 hours. No results for input(s): AMMONIA in the last 168 hours. CBC:  Recent Labs Lab 06/01/14 1209 06/02/14 0505  WBC 6.5 6.8  HGB 12.9 12.7  HCT 38.6 38.4  MCV 94.1 93.7  PLT 196 196   Cardiac Enzymes:  Recent Labs Lab 06/01/14 2045 06/02/14 0225 06/02/14 0834  TROPONINI <0.03 <0.03 <0.03   BNP: Invalid input(s): POCBNP CBG:  Recent Labs Lab 06/01/14 2203 06/02/14 0756  GLUCAP 132* 102*    No results found for this or any previous visit (from the past 240 hour(s)).   Studies:              All Imaging reviewed and is as per above notation   Scheduled Meds: . allopurinol  150 mg Oral QHS  . apixaban  2.5 mg Oral BID  . diltiazem  120 mg Oral Daily  . doxazosin  4 mg Oral QHS  . gabapentin  300 mg Oral QHS  . gemfibrozil  600 mg Oral Daily  . insulin aspart  0-5 Units Subcutaneous QHS  . insulin aspart  0-9 Units Subcutaneous TID WC  . losartan  100 mg Oral Daily  .  pantoprazole  40 mg Oral Daily  . sodium chloride  3 mL Intravenous Q12H   Continuous Infusions: . sodium chloride 1,000 mL (06/01/14 1229)     Assessment/Plan: afib This patients CHA2DS2-VASc Score and unadjusted Ischemic Stroke Rate (% per year) is equal to 11.2 % stroke rate/year from a score of 7-cardiology managing-started on Cardizem 120 daily from Cardizem GTT, started apixaban 2.5 twice a day.  relative hypotension-cut dose of losartan from 100-50 daily to facilitate rate control--we will also look into use of doxazosin and make adjustments if needed Hyperlipidemia-continue gemfibrozil 600 daily compensated chronic diastolic heart failure,NYHA class I-currently euvolemic and does not need Lasix at present time nor does patient need following up--echocardiogram performed to determine if any clot or wall motion abnormality given elevated troponin. Patient has a known history of  right-sided heart failure which may contribute to development of A. Fib-BNP is a poor indicator of her volume status stage III chronic kidney disease-monitor clinically-no need for diuresis at present time. 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 if rate above 120 persistently   Verneita Griffes, MD  [facility] Pager [phone] 06/02/2014, 9:25 AM    LOS: 1 day

## 2014-06-02 NOTE — Progress Notes (Signed)
UR completed 

## 2014-06-02 NOTE — ED Notes (Signed)
Pt stood at side of bed as ordered by MD. HR max at 138 bpm. Patient became lightheaded. No mentation changes. Still A&Ox4. Eating breakfast at this time.

## 2014-06-02 NOTE — Progress Notes (Signed)
ANTICOAGULATION CONSULT NOTE - Follow Up Consult  Pharmacy Consult for Heparin Indication: atrial fibrillation  Allergies  Allergen Reactions  . Codeine   . Lisinopril Other (See Comments)    fatigue  . Tylenol Pm Extra [Diphenhydramine-Apap (Sleep)]     Patient Measurements: Weight: 193 lb (87.544 kg) Heparin Dosing Weight:   Vital Signs: Temp: 97.9 F (36.6 C) (04/27 1737) Temp Source: Oral (04/27 1737) BP: 105/56 mmHg (04/28 0430) Pulse Rate: 65 (04/28 0430)  Labs:  Recent Labs  06/01/14 1209 06/01/14 2045 06/02/14 0224 06/02/14 0225 06/02/14 0505  HGB 12.9  --   --   --  12.7  HCT 38.6  --   --   --  38.4  PLT 196  --   --   --  196  APTT 42*  --   --   --   --   LABPROT 14.0  --   --   --   --   INR 1.07  --   --   --   --   HEPARINUNFRC  --   --  0.63  --   --   CREATININE 1.53*  --   --   --   --   TROPONINI  --  <0.03  --  <0.03  --     Estimated Creatinine Clearance: 30.1 mL/min (by C-G formula based on Cr of 1.53).   Medications:  Infusions:  . sodium chloride 1,000 mL (06/01/14 1229)  . diltiazem (CARDIZEM) infusion Stopped (06/02/14 0425)  . heparin 1,000 Units/hr (06/01/14 1835)    Assessment: Patient with heparin level at goal.  No heparin issues noted.   Goal of Therapy:  Heparin level 0.3-0.7 units/ml Monitor platelets by anticoagulation protocol: Yes   Plan:  Continue heparin drip at current rate Recheck level at [place], [place] 06/02/2014,5:24 AM

## 2014-06-02 NOTE — ED Notes (Signed)
Pt seated on edge of bed to dangle legs.  Family present at bedside.  Call light within reach. Pt denies dizziness/light-headedness.

## 2014-06-02 NOTE — ED Notes (Signed)
Bed: CS91 Expected date:  Expected time:  Means of arrival:  Comments: RES B when cleaned

## 2014-06-02 NOTE — ED Notes (Signed)
MD Meda Coffee at bedside. In to speak with patient regarding new blood thinner and heart rhythm management. Orders to stop Heparin and Diltiazem gtt. Stopped immediately. Will re-evaluate patient.

## 2014-06-02 NOTE — Consult Note (Signed)
Patient Name: [name] [name] Date of Encounter: 06/02/2014  Active Problems:   CKD stage 3 due to type 2 diabetes mellitus   Essential hypertension   Chronic diastolic CHF (congestive heart failure), NYHA class 1   Atrial fibrillation with RVR   DM II (diabetes mellitus, type II), controlled   Length of Stay: 1  SUBJECTIVE  The patient feels better today, denies CP or SOB.   CURRENT MEDS . allopurinol  150 mg Oral QHS  . apixaban  2.5 mg Oral BID  . diltiazem  120 mg Oral Daily  . doxazosin  4 mg Oral QHS  . gabapentin  300 mg Oral QHS  . gemfibrozil  600 mg Oral Daily  . insulin aspart  0-5 Units Subcutaneous QHS  . insulin aspart  0-9 Units Subcutaneous TID WC  . [START ON 06/03/2014] losartan  50 mg Oral Daily  . pantoprazole  40 mg Oral Daily  . sodium chloride  3 mL Intravenous Q12H   . sodium chloride 1,000 mL (06/01/14 1229)    OBJECTIVE  Filed Vitals:   06/02/14 1300 06/02/14 1307 06/02/14 1330 06/02/14 1400  BP: 107/70 108/55 115/73 106/63  Pulse: 103 122 114 105  Temp:  97.8 F (36.6 C)    TempSrc:  Oral    Resp: 20 20 21 23   Weight:      SpO2: 92% 95% 96% 95%    Intake/Output Summary (Last 24 hours) at 06/02/14 1442 Last data filed at 06/02/14 0919  Gross per 24 hour  Intake      0 ml  Output    400 ml  Net   -400 ml   Filed Weights   06/01/14 1143 06/01/14 1708  Weight: 193 lb (87.544 kg) 193 lb (87.544 kg)    PHYSICAL EXAM  General: Pleasant, NAD. Neuro: Alert and oriented X 3. Moves all extremities spontaneously. Psych: Normal affect. HEENT:  Normal  Neck: Supple without bruits or JVD. Lungs:  Resp regular and unlabored, CTA. Heart: RRR no s3, s4, or murmurs. Abdomen: Soft, non-tender, non-distended, BS + x 4.  Extremities: No clubbing, cyanosis or edema. DP/PT/Radials 2+ and equal bilaterally.  Accessory Clinical Findings  CBC  Recent Labs  06/01/14 1209 06/02/14 0505  WBC 6.5 6.8  HGB 12.9 12.7  HCT 38.6 38.4  MCV  94.1 93.7  PLT 196 115   Basic Metabolic Panel  Recent Labs  06/01/14 1209 06/02/14 0505  NA 136 134*  K 4.8 3.8  CL 104 103  CO2 20 22  GLUCOSE 159* 121*  BUN 47* 44*  CREATININE 1.53* 1.49*  CALCIUM 9.6 9.0  MG  --  2.1  PHOS  --  4.0   Liver Function Tests  Recent Labs  06/01/14 1209 06/02/14 0505  AST 30 23  ALT 15 14  ALKPHOS 99 85  BILITOT 0.9 0.7  PROT 7.4 6.5  ALBUMIN 4.2 3.7   Cardiac Enzymes  Recent Labs  06/01/14 2045 06/02/14 0225 06/02/14 0834  TROPONINI <0.03 <0.03 <0.03    Recent Labs  06/02/14 0505  TSH 2.644    Radiology/Studies  Dg Chest Portable 1 View  06/01/2014   CLINICAL DATA:  Bilateral shoulder and arm pain beginning today. Irregular heart rate today.  EXAM: PORTABLE CHEST - 1 VIEW  COMPARISON:  PA and lateral chest 05/09/2012.  FINDINGS: There is cardiomegaly without edema. Lungs are clear. No pneumothorax or pleural effusion.  IMPRESSION: Cardiomegaly without acute disease.   Electronically Signed  By: Inge Rise M.D.   On: 06/01/2014 12:38    TELE: a-fib with    ASSESSMENT AND PLAN  81 year old Caucasian female admitted with atrial fibrillation. Past history of hypertension and dyslipidemia. Past history of chronic diastolic heart failure  1. Newly recognized atrial fibrillation. She thinks that she may have had brief episodes in the past. Her ChaddsVasc score is 7 for congestive heart failure, hypertension, age greater than 43, diabetes, vascular disease (carotid stenosis) and female sex. She will need long-term anticoagulation.  We will switch to Eliquis 2.5 mg po BID and Diltiazem CD 120 mg po daily.  Would continue home beta blocker, echo is pending. Consider outpatient cardioversion after 4 weeks of adequate anticoagulation.  I would keep another day to see rate response with oral diltiazem and discharge tomorrow if ok.  2. Shoulder discomfort. Previously normal Myoview. Would cycle enzymes 3.  Essential hypertension 4. History of diastolic heart failure by prior echo   Signed, Dorothy Spark MD, [facility] 06/02/2014

## 2014-06-02 NOTE — ED Notes (Signed)
Hospitalist at bedside 

## 2014-06-02 NOTE — Progress Notes (Signed)
PT Cancellation Note  Patient Details Name: [name] [name] MRN: [id number] DOB: [birth year]   Cancelled Treatment:     Chart reviewed spoke with nurse. Noted and nurse reported they are waiting to transfer pt to a step down bed, however they have been standing pt with increased HR and pt with symptoms. Will hold PT at this time and check back with pt tomorrow.    Clide Dales 06/02/2014, 4:40 PM  Clide Dales, PT Pager: [phone] 06/02/2014

## 2014-06-02 NOTE — ED Notes (Signed)
Cardizem restarted at 5 mg/hour per MD Samtani. Ok with MAP <65 as long as mentation is unchanged and patient is conscious and A&Ox4 as she is at this time. Will contact MD with any mentation changes.

## 2014-06-03 LAB — CBC
HCT: 36.7 % (ref 36.0–46.0)
Hemoglobin: 12.2 g/dL (ref 12.0–15.0)
MCH: 31.3 pg (ref 26.0–34.0)
MCHC: 33.2 g/dL (ref 30.0–36.0)
MCV: 94.1 fL (ref 78.0–100.0)
PLATELETS: 179 10*3/uL (ref 150–400)
RBC: 3.9 MIL/uL (ref 3.87–5.11)
RDW: 13.5 % (ref 11.5–15.5)
WBC: 6 10*3/uL (ref 4.0–10.5)

## 2014-06-03 LAB — COMPREHENSIVE METABOLIC PANEL
ALK PHOS: 86 U/L (ref 39–117)
ALT: 12 U/L (ref 0–35)
AST: 27 U/L (ref 0–37)
Albumin: 3.7 g/dL (ref 3.5–5.2)
Anion gap: 10 (ref 5–15)
BUN: 52 mg/dL — ABNORMAL HIGH (ref 6–23)
CHLORIDE: 103 mmol/L (ref 96–112)
CO2: 20 mmol/L (ref 19–32)
Calcium: 9.3 mg/dL (ref 8.4–10.5)
Creatinine, Ser: 1.64 mg/dL — ABNORMAL HIGH (ref 0.50–1.10)
GFR, EST AFRICAN AMERICAN: 33 mL/min — AB (ref >90)
GFR, EST NON AFRICAN AMERICAN: 28 mL/min — AB (ref >90)
Glucose, Bld: 129 mg/dL — ABNORMAL HIGH (ref 70–99)
Potassium: 4.3 mmol/L (ref 3.5–5.1)
SODIUM: 133 mmol/L — AB (ref 135–145)
Total Bilirubin: 0.8 mg/dL (ref 0.3–1.2)
Total Protein: 6.8 g/dL (ref 6.0–8.3)

## 2014-06-03 LAB — GLUCOSE, CAPILLARY
GLUCOSE-CAPILLARY: 132 mg/dL — AB (ref 70–99)
GLUCOSE-CAPILLARY: 137 mg/dL — AB (ref 70–99)
Glucose-Capillary: 130 mg/dL — ABNORMAL HIGH (ref 70–99)
Glucose-Capillary: 140 mg/dL — ABNORMAL HIGH (ref 70–99)

## 2014-06-03 MED ORDER — DILTIAZEM HCL ER COATED BEADS 180 MG PO CP24
180.0000 mg | ORAL_CAPSULE | Freq: Every day | ORAL | Status: DC
  Administered 2014-06-03: 180 mg via ORAL
  Filled 2014-06-03: qty 1

## 2014-06-03 NOTE — Progress Notes (Signed)
Patient Name: [name] [name] Date of Encounter: 06/03/2014  Active Problems:   CKD stage 3 due to type 2 diabetes mellitus   Essential hypertension   Chronic diastolic CHF (congestive heart failure), NYHA class 1   Atrial fibrillation with RVR   DM II (diabetes mellitus, type II), controlled   A-fib   Length of Stay: 2  SUBJECTIVE  The patient feels better today, denies CP or SOB.   CURRENT MEDS . allopurinol  150 mg Oral QHS  . apixaban  2.5 mg Oral BID  . diltiazem  120 mg Oral Daily  . doxazosin  4 mg Oral QHS  . gabapentin  300 mg Oral QHS  . gemfibrozil  600 mg Oral Daily  . insulin aspart  0-5 Units Subcutaneous QHS  . insulin aspart  0-9 Units Subcutaneous TID WC  . losartan  50 mg Oral Daily  . pantoprazole  40 mg Oral Daily  . sodium chloride  3 mL Intravenous Q12H   OBJECTIVE  Filed Vitals:   06/02/14 1727 06/02/14 1746 06/02/14 2104 06/03/14 0552  BP:  128/66 105/71 142/94  Pulse:  119 111 112  Temp: 98.6 F (37 C) 97.2 F (36.2 C) 97.9 F (36.6 C) 97.3 F (36.3 C)  TempSrc: Oral Oral Oral Oral  Resp:  20 20 20   Height:  5\' 4"  (1.626 m)    Weight:  192 lb 7.4 oz (87.3 kg)    SpO2:  96% 98% 98%    Intake/Output Summary (Last 24 hours) at 06/03/14 0828 Last data filed at 06/03/14 0600  Gross per 24 hour  Intake 183.33 ml  Output    200 ml  Net -16.67 ml   Filed Weights   06/01/14 1143 06/01/14 1708 06/02/14 1746  Weight: 193 lb (87.544 kg) 193 lb (87.544 kg) 192 lb 7.4 oz (87.3 kg)    PHYSICAL EXAM  General: Pleasant, NAD. Neuro: Alert and oriented X 3. Moves all extremities spontaneously. Psych: Normal affect. HEENT:  Normal  Neck: Supple without bruits or JVD. Lungs:  Resp regular and unlabored, CTA. Heart: RRR no s3, s4, or murmurs. Abdomen: Soft, non-tender, non-distended, BS + x 4.  Extremities: No clubbing, cyanosis or edema. DP/PT/Radials 2+ and equal bilaterally.  Accessory Clinical Findings  CBC  Recent Labs  06/02/14 0505 06/03/14 0455  WBC 6.8 6.0  HGB 12.7 12.2  HCT 38.4 36.7  MCV 93.7 94.1  PLT 196 938   Basic Metabolic Panel  Recent Labs  06/02/14 0505 06/03/14 0455  NA 134* 133*  K 3.8 4.3  CL 103 103  CO2 22 20  GLUCOSE 121* 129*  BUN 44* 52*  CREATININE 1.49* 1.64*  CALCIUM 9.0 9.3  MG 2.1  --   PHOS 4.0  --    Liver Function Tests  Recent Labs  06/02/14 0505 06/03/14 0455  AST 23 27  ALT 14 12  ALKPHOS 85 86  BILITOT 0.7 0.8  PROT 6.5 6.8  ALBUMIN 3.7 3.7   No results for input(s): LIPASE, AMYLASE in the last 72 hours. Cardiac Enzymes  Recent Labs  06/01/14 2045 06/02/14 0225 06/02/14 0834  TROPONINI <0.03 <0.03 <0.03    Recent Labs  06/02/14 0505  TSH 2.644    Radiology/Studies  Dg Chest Portable 1 View  06/01/2014   CLINICAL DATA:  Bilateral shoulder and arm pain beginning today. Irregular heart rate today.  EXAM: PORTABLE CHEST - 1 VIEW  COMPARISON:  PA and lateral chest 05/09/2012.  FINDINGS:  There is cardiomegaly without edema. Lungs are clear. No pneumothorax or pleural effusion.  IMPRESSION: Cardiomegaly without acute disease.   Electronically Signed   By: Inge Rise M.D.   On: 06/01/2014 12:38   TELE: a-fib with RVR  ECHO: 06/02/2014 - Left ventricle: The cavity size was normal. There was moderate concentric hypertrophy. Systolic function was normal. The estimated ejection fraction was in the range of 55% to 60%. Wall motion was normal; there were no regional wall motion abnormalities. The study was not technically sufficient to allow evaluation of LV diastolic dysfunction due to atrial fibrillation. - Mitral valve: There was mild regurgitation.    ASSESSMENT AND PLAN  81 year old Caucasian female admitted with atrial fibrillation. Past history of hypertension and dyslipidemia. Past history of chronic diastolic heart failure  1. Newly recognized atrial fibrillation. She thinks that she may have had brief  episodes in the past. Her ChaddsVasc score is 7 for congestive heart failure, hypertension, age greater than 71, diabetes, vascular disease (carotid stenosis) and female sex. She will need long-term anticoagulation.  Started on Eliquis 2.5 mg po BID, She is still tachycardic with ventricular rate 100-120, we will increase Diltiazem CD to 180 mg po daily.  Would continue home beta blocker, echo is pending. Consider outpatient cardioversion after 4 weeks of adequate anticoagulation.  I would keep another day to see rate response with oral diltiazem and discharge tomorrow if ok.  2. Shoulder discomfort. Previously normal Myoview. Negative enzymes 3. Essential hypertension 4. History of diastolic heart failure by prior echo   Signed, Dorothy Spark MD, [facility] 06/03/2014

## 2014-06-03 NOTE — Evaluation (Addendum)
Physical Therapy Evaluation Patient Details Name: AMARRIA ANDREASEN MRN: [id number] DOB: Aug 24, 1930 Today's Date: 06/03/2014   History of Present Illness  81 yo female admitted with A fib with RVR, hypotension. hx of DM, obesity, gout, CHF, HTn. Pt lives alone  Clinical Impression  On eval, pt was Min guard assist for mobility-able to ambulate ~175 feet while holding onto IV pole. Slightly unsteady so instructed pt to use RW at home. Discussed d/c plan-daughter present-states pt will d/c home to daughter's house temporarily. Pt has been up walking with nursing to continue to assess HR with mobility. HR at rest: 110 bpm; during session: as high as 160s bpm; end of session 137 bpm. Also instructed pt in scapular retraction exercises to encourage trunk extension, improve posture.     Follow Up Recommendations No PT follow up;Supervision - Intermittent    Equipment Recommendations  None recommended by PT    Recommendations for Other Services       Precautions / Restrictions Precautions Precautions: Fall Precaution Comments: monitor HR Restrictions Weight Bearing Restrictions: No      Mobility  Bed Mobility               General bed mobility comments: pt oob in recliner  Transfers Overall transfer level: Needs assistance Equipment used: Rolling walker (2 wheeled) Transfers: Sit to/from Stand Sit to Stand: Min guard         General transfer comment: close guard for safety.   Ambulation/Gait Ambulation/Gait assistance: Min guard Ambulation Distance (Feet): 175 Feet Assistive device:  (IV pole to simulate cane use) Gait Pattern/deviations: Step-through pattern;Decreased stride length;Trunk flexed     General Gait Details: close guard for safety. Dyspnea 2/4. HR as high as 160s with ambulation.  Stairs            Wheelchair Mobility    Modified Rankin (Stroke Patients Only)       Balance                                              Pertinent Vitals/Pain Pain Assessment: Faces Faces Pain Scale: Hurts a little bit Pain Location: bil shoulders/upper arms Pain Descriptors / Indicators: Sore Pain Intervention(s): Monitored during session    Home Living Family/patient expects to be discharged to:: Private residence Living Arrangements: Alone (planning to d/c to daughter's home) Available Help at Discharge: Family Type of Home: House Home Access: Stairs to enter Entrance Stairs-Rails: Right Entrance Stairs-Number of Steps: 2 Home Layout: One level Home Equipment: Bedside commode;Walker - 2 wheels;Cane - single point Additional Comments: info is for daughter's home    Prior Function Level of Independence: Independent with assistive device(s)         Comments: normally uses cane     Hand Dominance        Extremity/Trunk Assessment   Upper Extremity Assessment: Generalized weakness           Lower Extremity Assessment: Generalized weakness      Cervical / Trunk Assessment: Kyphotic  Communication   Communication: No difficulties  Cognition Arousal/Alertness: Awake/alert Behavior During Therapy: WFL for tasks assessed/performed Overall Cognitive Status: Within Functional Limits for tasks assessed                      General Comments      Exercises        Assessment/Plan  PT Assessment Patient needs continued PT services  PT Diagnosis Difficulty walking;Generalized weakness   PT Problem List Decreased activity tolerance;Decreased mobility;Pain;Decreased balance  PT Treatment Interventions DME instruction;Gait training;Functional mobility training;Therapeutic activities;Therapeutic exercise;Patient/family education;Balance training   PT Goals (Current goals can be found in the Care Plan section) Acute Rehab PT Goals Patient Stated Goal: home PT Goal Formulation: With patient/family Time For Goal Achievement: 06/10/14 Potential to Achieve Goals: Good    Frequency  Min 3X/week   Barriers to discharge        Co-evaluation               End of Session Equipment Utilized During Treatment: Gait belt Activity Tolerance: Patient tolerated treatment well Patient left: in chair;with call bell/phone within reach;with family/visitor present           Time: 1005-1025 PT Time Calculation (min) (ACUTE ONLY): 20 min   Charges:   PT Evaluation $Initial PT Evaluation Tier I: 1 Procedure     PT G Codes:        Weston Anna, MPT Pager: [phone]

## 2014-06-03 NOTE — Care Management Note (Addendum)
    Page 1 of 1   06/03/2014     2:53:04 PM CARE MANAGEMENT NOTE 06/03/2014  Patient:  [name] [name], [name] [name]   Account Number:  [id number]  Date Initiated:  06/03/2014  Documentation initiated by:  [name]  Subjective/Objective Assessment:   81 y/o f admitted w/afib w/rvr.     Action/Plan:   From home alone.Has cane,rw,3n1.Has pcp,[facility]acy[facility].   Anticipated DC Date:  06/04/2014   Anticipated DC Plan:  [place]  CM consult      Choice offered to / List presented to:             Status of service:  In process, will continue to follow Medicare Important Message given?  YES (If response is "NO", the following Medicare IM given date fields will be blank) Date Medicare IM given:  06/03/2014 Medicare IM given by:  [facility] Date Additional Medicare IM given:   Additional Medicare IM given by:    Discharge Disposition:    Per UR Regulation:  Reviewed for med. necessity/level of care/duration of stay  If discussed at [place] of Stay Meetings, dates discussed:    Comments:  06/03/14 [name] RN BSN NCM [phone] PT-no f/u;supv/intermittent.Informed patient/dtr in rm of PT recommendations.explained supv intermittent services.Provided w/Private duty care list(out of pocket cost/independent decision)Patient/dtr voiced understanding.Patient will stay w/dtr @ d/c:[facility], [address] [name] [phone].Informed of elquis/pradaxa from script coverage:$45/30day supply(this is an estimate), coumadin$29.96.Also provided w/eliquis coupon as resource if able to use.MD updated.No further d/c needs.

## 2014-06-03 NOTE — Discharge Instructions (Signed)

## 2014-06-03 NOTE — Progress Notes (Signed)
[name] [name] AQT:[id number] DOB: [birth year] DOA: 06/01/2014 PCP: Alesia Richards, MD  Brief narrative: 81 y/o ? ty 2 DM, HLD, Allergies, Chr Dias HF-NYHA class 1, ckd III, Last Cardiolyte Stress neg, L carotid stenosis noted 2015 admitted ON 06/01/14 c Sholder pain, CP--Found to be in Afib c RVR-CHad2Vasc2= 7 Cardiology consulted in the ED and made recommnedations   Past medical history-As per Problem list Chart reviewed as below-   Consultants:  Cardiology  Procedures:  Echo Ef50-55%-poor acoustic windos  Antibiotics:    Subjective  doing okay No n/v/cp nor SOB Tolerating diet, passing some stool yesterday some arm pain   Objective    Interim History:   Telemetry: sinus tach-A. fib   Objective: Filed Vitals:   06/02/14 1746 06/02/14 2104 06/03/14 0552 06/03/14 0949  BP: 128/66 105/71 142/94 145/88  Pulse: 119 111 112   Temp: 97.2 F (36.2 C) 97.9 F (36.6 C) 97.3 F (36.3 C)   TempSrc: Oral Oral Oral   Resp: 20 20 20    Height: 5\' 4"  (1.626 m)     Weight: 87.3 kg (192 lb 7.4 oz)     SpO2: 96% 98% 98%     Intake/Output Summary (Last 24 hours) at 06/03/14 1225 Last data filed at 06/03/14 0855  Gross per 24 hour  Intake 543.33 ml  Output      0 ml  Net 543.33 ml    Exam:  General: alert pleasant oriented no apparent distress Cardiovascular: S1-S2 tachycardic Respiratory: clinically clear no added sound, Abdomen: soft nontender nondistended no rebound nor guarding Skinsoft supple no lower extremity edema Neurorange of motion intact  Data Reviewed: Basic Metabolic Panel:  Recent Labs Lab 06/01/14 1209 06/02/14 0505 06/03/14 0455  NA 136 134* 133*  K 4.8 3.8 4.3  CL 104 103 103  CO2 20 22 20   GLUCOSE 159* 121* 129*  BUN 47* 44* 52*  CREATININE 1.53* 1.49* 1.64*  CALCIUM 9.6 9.0 9.3  MG  --  2.1  --   PHOS  --  4.0  --    Liver Function Tests:  Recent Labs Lab 06/01/14 1209 06/02/14 0505 06/03/14 0455  AST 30 23 27     ALT 15 14 12   ALKPHOS 99 85 86  BILITOT 0.9 0.7 0.8  PROT 7.4 6.5 6.8  ALBUMIN 4.2 3.7 3.7   No results for input(s): LIPASE, AMYLASE in the last 168 hours. No results for input(s): AMMONIA in the last 168 hours. CBC:  Recent Labs Lab 06/01/14 1209 06/02/14 0505 06/03/14 0455  WBC 6.5 6.8 6.0  HGB 12.9 12.7 12.2  HCT 38.6 38.4 36.7  MCV 94.1 93.7 94.1  PLT 196 196 179   Cardiac Enzymes:  Recent Labs Lab 06/01/14 2045 06/02/14 0225 06/02/14 0834  TROPONINI <0.03 <0.03 <0.03   BNP: Invalid input(s): POCBNP CBG:  Recent Labs Lab 06/02/14 0756 06/02/14 1137 06/02/14 1706 06/02/14 2213 06/03/14 0737  GLUCAP 102* 150* 132* 143* 132*    Recent Results (from the past 240 hour(s))  Culture, Urine     Status: None (Preliminary result)   Collection Time: 06/01/14  8:30 PM  Result Value Ref Range Status   Specimen Description URINE, CLEAN CATCH  Final   Special Requests NONE  Final   Colony Count   Final    >=100,000 COLONIES/ML Performed at [facility] [place] Performed at [facility]    Report Status PENDING  Incomplete     Studies:              All Imaging reviewed and is as per above notation   Scheduled Meds: . allopurinol  150 mg Oral QHS  . apixaban  2.5 mg Oral BID  . diltiazem  180 mg Oral Daily  . doxazosin  4 mg Oral QHS  . gabapentin  300 mg Oral QHS  . gemfibrozil  600 mg Oral Daily  . insulin aspart  0-5 Units Subcutaneous QHS  . insulin aspart  0-9 Units Subcutaneous TID WC  . losartan  50 mg Oral Daily  . pantoprazole  40 mg Oral Daily  . sodium chloride  3 mL Intravenous Q12H   Continuous Infusions: . sodium chloride 1,000 mL (06/01/14 1229)     Assessment/Plan: afib This patients CHA2DS2-VASc Score and unadjusted Ischemic Stroke Rate (% per year) is equal to 11.2 % stroke rate/year from a score of 7-cardiology managing- Cardizem 120 daily-->180 06/03/14 from Cardizem GTT,  continue apixaban 2.5 twice a day-->Op consideration DCCV per Cards relative hypotension-cut dose of losartan from 100-50 daily to facilitate rate control--we will also look into use of doxazosin and make adjustments if needed Arm pain-unlikely angicnal equivalent-cardiology rec's consideration OP stress Hyperlipidemia-continue gemfibrozil 600 daily compensated chronic diastolic heart failure,NYHA class I-currently euvolemic and does not need Lasix at present time nor does patient need following up--echocardiogram performed to determine if any clot or wall motion abnormality given elevated troponin. Patient has a known history of right-sided heart failure which may contribute to development of A. Fib-BNP is a poor indicator of her volume status stage III chronic kidney disease-monitor clinically-no need for diuresis at present time. 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

## 2014-06-04 LAB — BASIC METABOLIC PANEL
Anion gap: 11 (ref 5–15)
BUN: 55 mg/dL — ABNORMAL HIGH (ref 6–23)
CALCIUM: 9.6 mg/dL (ref 8.4–10.5)
CO2: 21 mmol/L (ref 19–32)
Chloride: 104 mmol/L (ref 96–112)
Creatinine, Ser: 1.58 mg/dL — ABNORMAL HIGH (ref 0.50–1.10)
GFR, EST AFRICAN AMERICAN: 34 mL/min — AB (ref >90)
GFR, EST NON AFRICAN AMERICAN: 29 mL/min — AB (ref >90)
Glucose, Bld: 130 mg/dL — ABNORMAL HIGH (ref 70–99)
POTASSIUM: 4.4 mmol/L (ref 3.5–5.1)
SODIUM: 136 mmol/L (ref 135–145)

## 2014-06-04 LAB — CBC
HEMATOCRIT: 35.6 % — AB (ref 36.0–46.0)
Hemoglobin: 11.6 g/dL — ABNORMAL LOW (ref 12.0–15.0)
MCH: 30.8 pg (ref 26.0–34.0)
MCHC: 32.6 g/dL (ref 30.0–36.0)
MCV: 94.4 fL (ref 78.0–100.0)
PLATELETS: 192 10*3/uL (ref 150–400)
RBC: 3.77 MIL/uL — AB (ref 3.87–5.11)
RDW: 13.5 % (ref 11.5–15.5)
WBC: 7.1 10*3/uL (ref 4.0–10.5)

## 2014-06-04 LAB — GLUCOSE, CAPILLARY
GLUCOSE-CAPILLARY: 151 mg/dL — AB (ref 70–99)
Glucose-Capillary: 124 mg/dL — ABNORMAL HIGH (ref 70–99)

## 2014-06-04 LAB — URINE CULTURE

## 2014-06-04 LAB — MAGNESIUM: MAGNESIUM: 2.1 mg/dL (ref 1.5–2.5)

## 2014-06-04 MED ORDER — DILTIAZEM HCL ER 120 MG PO CP12: 120.0000 mg | ORAL_CAPSULE | Freq: Two times a day (BID) | ORAL | Status: DC

## 2014-06-04 MED ORDER — DILTIAZEM HCL ER 60 MG PO CP12
120.0000 mg | ORAL_CAPSULE | Freq: Two times a day (BID) | ORAL | Status: DC
  Administered 2014-06-04: 120 mg via ORAL
  Filled 2014-06-04 (×2): qty 2

## 2014-06-04 MED ORDER — DILTIAZEM HCL ER COATED BEADS 180 MG PO CP24: 180.0000 mg | ORAL_CAPSULE | Freq: Every day | ORAL | Status: DC

## 2014-06-04 MED ORDER — DILTIAZEM HCL ER COATED BEADS 240 MG PO TB24
240.0000 mg | ORAL_TABLET | Freq: Every day | ORAL | Status: DC
Start: 2014-06-04 — End: 2014-10-27

## 2014-06-04 MED ORDER — DILTIAZEM HCL 60 MG PO TABS
60.0000 mg | ORAL_TABLET | Freq: Once | ORAL | Status: AC
  Administered 2014-06-04: 60 mg via ORAL
  Filled 2014-06-04: qty 1

## 2014-06-04 MED ORDER — APIXABAN 2.5 MG PO TABS: 2.5000 mg | ORAL_TABLET | Freq: Two times a day (BID) | ORAL | Status: DC

## 2014-06-04 MED ORDER — LOSARTAN POTASSIUM 100 MG PO TABS: 50.0000 mg | ORAL_TABLET | Freq: Every day | ORAL | Status: DC

## 2014-06-04 NOTE — Progress Notes (Signed)
Patient ambulated and HR up to 150s-170s.  Patient slightly fatigued while ambulating.  Patient up to chair, will continue to monitor.

## 2014-06-04 NOTE — Discharge Summary (Addendum)
Physician Discharge Summary  [name] [name] AST:[id number] DOB: Sep 22, 1930 DOA: 06/01/2014  PCP: Alesia Richards, MD  Admit date: 06/01/2014 Discharge date: 06/04/2014  Time spent: 36 minutes  Recommendations for Outpatient Follow-up:  1. Medications changes this admit  Stop Cardura/Atenolol  Losartan 100>>>50 mg daily  Cardizem 240 CD q24 on d/c home  Eliquis 2.5 bid started for Afib  2. Consider OP Bmet/CBC as op with PCP 3. Consider OP Stress myoview/cath per cardiology  Discharge Diagnoses:  Active Problems:   CKD stage 3 due to type 2 diabetes mellitus   Essential hypertension   Chronic diastolic CHF (congestive heart failure), NYHA class 1   Atrial fibrillation with RVR   DM II (diabetes mellitus, type II), controlled   A-fib   Discharge Condition: good  Diet recommendation:  HH low salt  Filed Weights   06/01/14 1708 06/02/14 1746 06/04/14 0525  Weight: 87.544 kg (193 lb) 87.3 kg (192 lb 7.4 oz) 88.089 kg (194 lb 3.2 oz)    History of present illness:  81 y/o ? ty 2 DM, HLD, Allergies, Chr Dias HF-NYHA class 1, ckd III, Last Cardiolyte Stress neg, L carotid stenosis noted 2015 admitted ON 06/01/14 c Sholder pain, CP--Found to be in Afib c RVR-CHad2Vasc2= 7 Cardiology consulted in the ED and made recommnedations  Hospital Course:  Assessment/Plan: 1. afib This patients CHA2DS2-VASc Score and unadjusted Ischemic Stroke Rate (% per year) is equal to 11.2 % stroke rate/year from a score of 7-cardiology managing- Cardizem 120 daily-->240mg  LA  on d/c home from Cardizem GTT, continue apixaban 2.5 twice a day-->Op consideration DCCV per Cards 2. relative hypotension-cut dose of losartan from 100-50 daily to facilitate rate control--we d/c on day of d/c doxazosin.  Atenolol d/c on admit 3. Arm pain-unlikely anginal equivalent-cardiology rec's consideration OP stress--will forward to 1ry Cardiologist 4. Hyperlipidemia-continue gemfibrozil 600 daily 5. compensated  chronic diastolic heart failure,NYHA class I-currently euvolemic and does not need Lasix at present time nor does patient need following up--echocardiogram performed to determine if any clot or wall motion abnormality given elevated troponin. Patient has a known history of right-sided heart failure which may contribute to development of A. Fib-BNP is a poor indicator of her volume status 6. stage III chronic kidney disease-monitor clinically-no need for diuresis at present time. Baseline BUN/creatinine varies 7. Left carotid stenosis 2015-follow clinically as an outpatient 8. Asymptomatic bacteriuria-no fever, no chills no dysuria.  NO role abx 9. history gout-continue allopurinol 150 daily at bedtime 10. 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 reflux continue pantoprazole 40 daily   Consultants: Cardiology  Procedures:  Echo Ef50-55%-poor acoustic windos    Discharge Exam: Filed Vitals:   06/04/14 0525  BP: 134/76  Pulse: 72  Temp: 97.6 F (36.4 C)  Resp: 18    General: alert pleasant oriented in nad Cardiovascular: s1 s 2no m/r/g Respiratory: chest clear  Discharge Instructions   Discharge Instructions    Diet - low sodium heart healthy    Complete by:  As directed      Discharge instructions    Complete by:  As directed   Please note changes to medications--you will be given a clear printed list of the changes Please follow up with your regular cardiologist--you might need a stress test at some point Get labs at regular MD office in 1-2 weeks     Increase activity slowly    Complete by:  As directed           Current Discharge Medication List    START taking these medications   Details  apixaban (ELIQUIS) 2.5 MG TABS tablet Take 1 tablet (2.5 mg total) by mouth 2 (two) times daily. Qty: 60 tablet, Refills: 0    diltiazem (CARDIZEM  CD) 180 MG 24 hr capsule Take 1 capsule (180 mg total) by mouth daily. Qty: 30 capsule, Refills: 0      CONTINUE these medications which have CHANGED   Details  losartan (COZAAR) 100 MG tablet Take 0.5 tablets (50 mg total) by mouth daily. Qty: 30 tablet, Refills: 3      CONTINUE these medications which have NOT CHANGED   Details  allopurinol (ZYLOPRIM) 300 MG tablet Take 150 mg by mouth at bedtime.    ALPRAZolam (XANAX) 1 MG tablet Take 1/2 to 1 tablet at hour of Sleep Qty: 30 tablet, Refills: 5    cholecalciferol (VITAMIN D) 1000 UNITS tablet Take 4,000 Units by mouth every morning.     Flaxseed, Linseed, (FLAX SEED OIL) 1000 MG CAPS Take 1 capsule by mouth every morning.    furosemide (LASIX) 40 MG tablet TAKE 1 on Monday Wed and Friday    gabapentin (NEURONTIN) 300 MG capsule TAKE ONE CAPSULE BY MOUTH EVERY DAY AT BEDTIME Qty: 90 capsule, Refills: 4    gemfibrozil (LOPID) 600 MG tablet Take 1 tablet (600 mg total) by mouth daily.    Lancets (FREESTYLE) lancets Check blood glucose 1 time daily.  PI-R51.88 Qty: 100 each, Refills: 6    loperamide (IMODIUM A-D) 2 MG tablet Take 2 mg by mouth 4 (four) times daily as needed for diarrhea or loose stools.    magnesium oxide (MAG-OX) 400 MG tablet Take 400 mg by mouth daily.    omeprazole (PRILOSEC) 20 MG capsule Take 20 mg by mouth every morning.    ranitidine (ZANTAC) 300 MG tablet Take 1 tablet (300 mg total) by mouth 2 (two) times daily. Qty: 60 tablet, Refills: 3    vitamin B-12 (CYANOCOBALAMIN) 1000 MCG tablet Take 1,000 mcg by mouth every morning.    vitamin C (ASCORBIC ACID) 500 MG tablet Take 500 mg by mouth 2 (two) times daily.       STOP taking these medications     aspirin EC 81 MG tablet      atenolol (TENORMIN) 100 MG tablet      doxazosin (CARDURA) 8 MG tablet      glucose blood (FREESTYLE LITE) test strip        Allergies  Allergen Reactions  . Codeine   . Lisinopril Other (See Comments)     fatigue  . Tylenol Pm Extra [Diphenhydramine-Apap (Sleep)]       The results of significant diagnostics from this hospitalization (including imaging, microbiology, ancillary and laboratory) are listed below for reference.    Significant Diagnostic Studies: Dg Chest Portable 1 View  06/01/2014   CLINICAL DATA:  Bilateral shoulder and arm pain beginning today. Irregular heart rate today.  EXAM: PORTABLE CHEST - 1 VIEW  COMPARISON:  PA and lateral chest 05/09/2012.  FINDINGS: There is cardiomegaly without edema. Lungs are clear. No pneumothorax or pleural effusion.  IMPRESSION: Cardiomegaly without acute disease.   Electronically Signed   By: Inge Rise M.D.   On: 06/01/2014 12:38    Microbiology: Recent Results (from the past 240 hour(s))  Culture, Urine     Status: None (Preliminary result)   Collection Time: 06/01/14  8:30 PM  Result Value Ref Range Status   Specimen Description URINE, CLEAN CATCH  Final   Special Requests NONE  Final   Colony Count   Final    >=100,000 COLONIES/ML Performed at [facility]    Culture   Final    [place] Performed at [facility]    Report Status PENDING  Incomplete     Labs: Basic Metabolic Panel:  Recent Labs Lab 06/01/14 1209 06/02/14 0505 06/03/14 0455 06/04/14 0415  NA 136 134* 133* 136  K 4.8 3.8 4.3 4.4  CL 104 103 103 104  CO2 20 22 20 21   GLUCOSE 159* 121* 129* 130*  BUN 47* 44* 52* 55*  CREATININE 1.53* 1.49* 1.64* 1.58*  CALCIUM 9.6 9.0 9.3 9.6  MG  --  2.1  --  2.1  PHOS  --  4.0  --   --    Liver Function Tests:  Recent Labs Lab 06/01/14 1209 06/02/14 0505 06/03/14 0455  AST 30 23 27   ALT 15 14 12   ALKPHOS 99 85 86  BILITOT 0.9 0.7 0.8  PROT 7.4 6.5 6.8  ALBUMIN 4.2 3.7 3.7   No results for input(s): LIPASE, AMYLASE in the last 168 hours. No results for input(s): AMMONIA in the last 168 hours. CBC:  Recent Labs Lab 06/01/14 1209 06/02/14 0505 06/03/14 0455  06/04/14 0415  WBC 6.5 6.8 6.0 7.1  HGB 12.9 12.7 12.2 11.6*  HCT 38.6 38.4 36.7 35.6*  MCV 94.1 93.7 94.1 94.4  PLT 196 196 179 192   Cardiac Enzymes:  Recent Labs Lab 06/01/14 2045 06/02/14 0225 06/02/14 0834  TROPONINI <0.03 <0.03 <0.03   BNP: BNP (last 3 results)  Recent Labs  06/02/14 0505  BNP 520.6*    ProBNP (last 3 results)  Recent Labs  06/30/13 0942  PROBNP 72.0    CBG:  Recent Labs Lab 06/03/14 0737 06/03/14 1227 06/03/14 1656 06/03/14 2156 06/04/14 0735  GLUCAP 132* 130* 137* 140* 124*       Signed:  Nita Sells  [facility] 06/04/2014, 8:38 AM

## 2014-06-04 NOTE — Progress Notes (Signed)
Patient ambulated to the hallway. Heart rate went up to 120's, patient is asymptomatic. No respiratory distress noted.

## 2014-06-04 NOTE — Progress Notes (Signed)
Patient ambulated early this AM.  HR up to 150s-170s.  Will administer PO diltiazem and ambulate patient again after 1 hour.  MD on floor and aware.

## 2014-06-08 ENCOUNTER — Encounter: Payer: Self-pay | Admitting: Physician Assistant

## 2014-06-08 ENCOUNTER — Ambulatory Visit (INDEPENDENT_AMBULATORY_CARE_PROVIDER_SITE_OTHER): Payer: Medicare Other | Admitting: Physician Assistant

## 2014-06-08 VITALS — BP 120/72 | HR 118 | Temp 97.9°F | Resp 16 | Wt 195.0 lb

## 2014-06-08 DIAGNOSIS — E1129 Type 2 diabetes mellitus with other diabetic kidney complication: Secondary | ICD-10-CM | POA: Diagnosis not present

## 2014-06-08 DIAGNOSIS — N183 Chronic kidney disease, stage 3 (moderate): Secondary | ICD-10-CM | POA: Diagnosis not present

## 2014-06-08 DIAGNOSIS — R300 Dysuria: Secondary | ICD-10-CM | POA: Diagnosis not present

## 2014-06-08 DIAGNOSIS — E1122 Type 2 diabetes mellitus with diabetic chronic kidney disease: Secondary | ICD-10-CM

## 2014-06-08 DIAGNOSIS — I4891 Unspecified atrial fibrillation: Secondary | ICD-10-CM

## 2014-06-08 DIAGNOSIS — Z79899 Other long term (current) drug therapy: Secondary | ICD-10-CM

## 2014-06-08 LAB — CBC WITH DIFFERENTIAL/PLATELET
BASOS ABS: 0 10*3/uL (ref 0.0–0.1)
BASOS PCT: 0 % (ref 0–1)
Eosinophils Absolute: 0.3 10*3/uL (ref 0.0–0.7)
Eosinophils Relative: 4 % (ref 0–5)
HCT: 38 % (ref 36.0–46.0)
Hemoglobin: 13 g/dL (ref 12.0–15.0)
Lymphocytes Relative: 20 % (ref 12–46)
Lymphs Abs: 1.5 10*3/uL (ref 0.7–4.0)
MCH: 31.3 pg (ref 26.0–34.0)
MCHC: 34.2 g/dL (ref 30.0–36.0)
MCV: 91.3 fL (ref 78.0–100.0)
MONO ABS: 0.7 10*3/uL (ref 0.1–1.0)
MPV: 11.8 fL (ref 8.6–12.4)
Monocytes Relative: 9 % (ref 3–12)
Neutro Abs: 5 10*3/uL (ref 1.7–7.7)
Neutrophils Relative %: 67 % (ref 43–77)
PLATELETS: 263 10*3/uL (ref 150–400)
RBC: 4.16 MIL/uL (ref 3.87–5.11)
RDW: 14 % (ref 11.5–15.5)
WBC: 7.5 10*3/uL (ref 4.0–10.5)

## 2014-06-08 LAB — BASIC METABOLIC PANEL WITH GFR
BUN: 55 mg/dL — ABNORMAL HIGH (ref 6–23)
CO2: 22 meq/L (ref 19–32)
Calcium: 10 mg/dL (ref 8.4–10.5)
Chloride: 99 mEq/L (ref 96–112)
Creat: 1.64 mg/dL — ABNORMAL HIGH (ref 0.50–1.10)
GFR, Est African American: 33 mL/min — ABNORMAL LOW
GFR, Est Non African American: 29 mL/min — ABNORMAL LOW
GLUCOSE: 114 mg/dL — AB (ref 70–99)
POTASSIUM: 4.6 meq/L (ref 3.5–5.3)
Sodium: 135 mEq/L (ref 135–145)

## 2014-06-08 LAB — MAGNESIUM: Magnesium: 2.2 mg/dL (ref 1.5–2.5)

## 2014-06-08 LAB — HEPATIC FUNCTION PANEL
ALT: 12 U/L (ref 0–35)
AST: 18 U/L (ref 0–37)
Albumin: 4.4 g/dL (ref 3.5–5.2)
Alkaline Phosphatase: 87 U/L (ref 39–117)
BILIRUBIN DIRECT: 0.1 mg/dL (ref 0.0–0.3)
Indirect Bilirubin: 0.4 mg/dL (ref 0.2–1.2)
Total Bilirubin: 0.5 mg/dL (ref 0.2–1.2)
Total Protein: 7.2 g/dL (ref 6.0–8.3)

## 2014-06-08 MED ORDER — DICLOFENAC SODIUM 1 % TD GEL: 2.0000 g | Freq: Four times a day (QID) | TRANSDERMAL | Status: DC

## 2014-06-08 NOTE — Progress Notes (Signed)
Assessment and Plan: Afib with RVR- still with symptoms- will likely need amiodarone versus stopping of cozaar and initiation of low dose atenolol however seeing cardio tomorrow, will likely benefit from cardioversion about proper anticoagulation. No dark black stool, blood in stool, HA, dizziness.  Fatigue/snoring/falling asleep quickly- ? OSA contributing to Afib- may need sleep study, check labs Neck pain- appears to be more mechanical had normal troponins and declines EKG at this time, follow with cardio tomorrow, will treat with voltern gel, heat and massage Abnormal CBC/BMP in the hospitial will recheck  Dysuria- will get UA C&S, did have bacturia without symptoms in the hospital will treat.    Future Appointments Date Time Provider Cesar [place]avez  06/09/2014 3:15 PM Sueanne Margarita, MD [facility] None  09/14/2014 8:45 AM Unk Pinto, MD [facility] None  02/28/2015 10:00 AM Unk Pinto, MD [facility] None     HPI 81 y.o.female presents for follow up from the hospital. Admit date to the hospital was 06/01/14, patient was discharged from the hospital on 06/04/14, patient was admitted for: Afib with RVR.Marland Kitchen She went to the ER with bilateral shoulder pain across her back and into her neck, felt irregular heart rate and went to the ER. She had normal troponin's s x3 . She had new Afib with RVR with LAFB, she was admitted. CHADSVASC 7 so she was started on elliquis 2.5 BID. She is off her atenolol and doxazosin and started on cardizem 240 CD. Had a stress test 06/23/2013 with minimal abnormalities thought to be breast attenuation. Echo 05/2014 showed normal EF, diastolic dysfunction and mild MR.   She states that today she woke up at 3 am with neck pain and right lower quadrant pain. She has been having fatigue with minimal exertion like making dinner, she has been having some mild palpitations. Pain her neck is worse with movement. She denies cough, wheezing, PND, orthopnea, worsening edema.  Weight is stable.   Wt Readings from Last 3 Encounters:  06/08/14 195 lb (88.451 kg)  06/04/14 194 lb 3.2 oz (88.089 kg)  02/23/14 198 lb 12.8 oz (90.175 kg)    Images while in the hospital: Dg Chest Portable 1 View  06/01/2014   CLINICAL DATA:  Bilateral shoulder and arm pain beginning today. Irregular heart rate today.  EXAM: PORTABLE CHEST - 1 VIEW  COMPARISON:  PA and lateral chest 05/09/2012.  FINDINGS: There is cardiomegaly without edema. Lungs are clear. No pneumothorax or pleural effusion.  IMPRESSION: Cardiomegaly without acute disease.   Electronically Signed   By: Inge Rise M.D.   On: 06/01/2014 12:38    Past Medical History  Diagnosis Date  . Obesity   . Hyperlipidemia   . Type II or unspecified type diabetes mellitus without mention of complication, not stated as uncontrolled   . Allergy   . Arthritis   . Gout   . Vitamin D deficiency   . Carotid artery stenosis     1-39% right and 40-59% left by dopplers 2015  . Chronic diastolic CHF (congestive heart failure), NYHA class 1   . Hypertension      Allergies  Allergen Reactions  . Codeine   . Lisinopril Other (See Comments)    fatigue  . Tylenol Pm Extra [Diphenhydramine-Apap (Sleep)]       Current Outpatient Prescriptions on File Prior to Visit  Medication Sig Dispense Refill  . allopurinol (ZYLOPRIM) 300 MG tablet Take 150 mg by mouth at bedtime.    . ALPRAZolam (XANAX) 1 MG tablet  Take 1/2 to 1 tablet at hour of Sleep (Patient taking differently: Take 0.5-1 mg by mouth at bedtime as needed for sleep. ) 30 tablet 5  . apixaban (ELIQUIS) 2.5 MG TABS tablet Take 1 tablet (2.5 mg total) by mouth 2 (two) times daily. 60 tablet 0  . cholecalciferol (VITAMIN D) 1000 UNITS tablet Take 4,000 Units by mouth every morning.     . diltiazem (CARDIZEM LA) 240 MG 24 hr tablet Take 1 tablet (240 mg total) by mouth daily. 60 tablet 0  . Flaxseed, Linseed, (FLAX SEED OIL) 1000 MG CAPS Take 1 capsule by mouth every  morning.    . furosemide (LASIX) 40 MG tablet TAKE 1 on Monday Wed and Friday    . gabapentin (NEURONTIN) 300 MG capsule TAKE ONE CAPSULE BY MOUTH EVERY DAY AT BEDTIME 90 capsule 4  . gemfibrozil (LOPID) 600 MG tablet Take 1 tablet (600 mg total) by mouth daily.    . Lancets (FREESTYLE) lancets Check blood glucose 1 time daily.  DX-E11.22 100 each 6  . loperamide (IMODIUM A-D) 2 MG tablet Take 2 mg by mouth 4 (four) times daily as needed for diarrhea or loose stools.    Marland Kitchen losartan (COZAAR) 100 MG tablet Take 0.5 tablets (50 mg total) by mouth daily. 30 tablet 3  . magnesium oxide (MAG-OX) 400 MG tablet Take 400 mg by mouth daily.    Marland Kitchen omeprazole (PRILOSEC) 20 MG capsule Take 20 mg by mouth every morning.    . ranitidine (ZANTAC) 300 MG tablet Take 1 tablet (300 mg total) by mouth 2 (two) times daily. (Patient taking differently: Take 300 mg by mouth as needed for heartburn. ) 60 tablet 3  . vitamin B-12 (CYANOCOBALAMIN) 1000 MCG tablet Take 1,000 mcg by mouth every morning.    . vitamin C (ASCORBIC ACID) 500 MG tablet Take 500 mg by mouth 2 (two) times daily.      No current facility-administered medications on file prior to visit.    ROS: all negative except above.   Physical Exam: Filed Weights   06/08/14 0939  Weight: 195 lb (88.451 kg)   BP 120/72 mmHg  Pulse 118  Temp(Src) 97.9 F (36.6 C)  Resp 16  Wt 195 lb (88.451 kg)  SpO2 97% HEENT: normocephalic, sclerae anicteric, TMs pearly, nares patent, no discharge or erythema, pharynx normal Oral cavity: MMM, no lesions Neck: supple, no lymphadenopathy, no thyromegaly, no masses Heart: Irreg Irreg, rate 100 while at rest, normal S1, S2 Lungs: CTA bilaterally, no wheezes, rhonchi, or rales Abdomen: +bs, soft, obese, slightly lower right quadrant/suprapubic tenderness, non distended, no masses, no hepatomegaly, no splenomegaly, no CVA tenderness Musculoskeletal: + bilateral trap tenderness with pain with rotation/flexion of her  head, no swelling, no obvious deformity Extremities: 1-2 + edema, no cyanosis, no clubbing Pulses: 2+ symmetric, upper and lower extremities, normal cap refill Neurological: alert, oriented x 3, CN2-12 intact, strength normal upper extremities and lower extremities, sensation decreased bilateral feet, DTRs 2+ throughout, + romberg, negative finger to nose, gait slow with cane Psychiatric: normal affect, behavior normal, pleasant     Vicie Mutters, PA-C 10:24 AM [facility]

## 2014-06-08 NOTE — Progress Notes (Signed)
Cardiology Office Note   Date:  06/09/2014   ID:  [name] [name], DOB May 27, 1930, MRN [id number]  PCP:  Alesia Richards, MD    Chief Complaint  Patient presents with  . Congestive Heart Failure  . Follow-up    hypertension  . Atrial Fibrillation      History of Present Illness: [name] [name] is a 81 y.o. female with a history of HTN, dyslipidemia, obesity, DM, mild pulmonary HTN, moderate TR and chronic diastolic CHF and moderate left carotid artery stenosis who presents today for followup. She was recently in [facility] with new onset atrial fibrillation with RVR.  She was started on Apixiban for a CHADS2VASC score of 7.  She was started on Cardizem for rate control.  Her Atenolol/doxazosin were stopped and Losartan cut back to 50mg  daily due to hypotension.  She complained of some left arm pain that was felt to possibly be an anginal equivalent and outpt stress test was recommended.   2D echo showed normal LVF with EF 55-60%.  She is doing well. She occasionally has some pressure in the mid sternal portion of her chest.  She has not noticed any more of the left arm pain. She has chronic SOB but only gets DOE when exerting herself. She has chronic LE edema which controlled with diuretics. She says that for the past few days she has not had much energy and has not slept well recently.  She also has been feeling her heart beating irregularly.  She denies any dizziness or syncope since her discharge.    Past Medical History  Diagnosis Date  . Obesity   . Hyperlipidemia   . Type II or unspecified type diabetes mellitus without mention of complication, not stated as uncontrolled   . Allergy   . Arthritis   . Gout   . Vitamin D deficiency   . Carotid artery stenosis     1-39% right and 40-59% left by dopplers 2015  . Chronic diastolic CHF (congestive heart failure), NYHA class 1   . Hypertension     Past Surgical History  Procedure Laterality Date  . Joint replacement    .  Dilitation and currage       Current Outpatient Prescriptions  Medication Sig Dispense Refill  . allopurinol (ZYLOPRIM) 300 MG tablet Take 150 mg by mouth at bedtime.    . ALPRAZolam (XANAX) 1 MG tablet Take 1/2 to 1 tablet at hour of Sleep (Patient taking differently: Take 0.5-1 mg by mouth at bedtime as needed for sleep. ) 30 tablet 5  . apixaban (ELIQUIS) 2.5 MG TABS tablet Take 1 tablet (2.5 mg total) by mouth 2 (two) times daily. 60 tablet 0  . cholecalciferol (VITAMIN D) 1000 UNITS tablet Take 4,000 Units by mouth every morning.     . diclofenac sodium (VOLTAREN) 1 % GEL Apply 2 g topically 4 (four) times daily. 100 g 3  . diltiazem (CARDIZEM LA) 240 MG 24 hr tablet Take 1 tablet (240 mg total) by mouth daily. 60 tablet 0  . Flaxseed, Linseed, (FLAX SEED OIL) 1000 MG CAPS Take 1 capsule by mouth every morning.    . furosemide (LASIX) 40 MG tablet TAKE 1 on Monday Wed and Friday    . gabapentin (NEURONTIN) 300 MG capsule TAKE ONE CAPSULE BY MOUTH EVERY DAY AT BEDTIME 90 capsule 4  . gemfibrozil (LOPID) 600 MG tablet Take 1 tablet (600 mg total) by mouth daily.    . Lancets (FREESTYLE) lancets Check  blood glucose 1 time daily.  DX-E11.22 100 each 6  . loperamide (IMODIUM A-D) 2 MG tablet Take 2 mg by mouth 4 (four) times daily as needed for diarrhea or loose stools.    Marland Kitchen losartan (COZAAR) 100 MG tablet Take 0.5 tablets (50 mg total) by mouth daily. 30 tablet 3  . magnesium oxide (MAG-OX) 400 MG tablet Take 400 mg by mouth daily.    Marland Kitchen omeprazole (PRILOSEC) 20 MG capsule Take 20 mg by mouth every morning.    . ranitidine (ZANTAC) 300 MG tablet Take 1 tablet (300 mg total) by mouth 2 (two) times daily. (Patient taking differently: Take 300 mg by mouth as needed for heartburn. ) 60 tablet 3  . vitamin B-12 (CYANOCOBALAMIN) 1000 MCG tablet Take 1,000 mcg by mouth every morning.    . vitamin C (ASCORBIC ACID) 500 MG tablet Take 500 mg by mouth 2 (two) times daily.      No current  facility-administered medications for this visit.    Allergies:   Codeine; Lisinopril; and Tylenol pm extra    Social History:  The patient  reports that she has never smoked. She has never used smokeless tobacco. She reports that she does not drink alcohol or use illicit drugs.   Family History:  The patient's family history includes Asthma in her father and son; Diabetes in her brother; Heart attack in her father; Hyperlipidemia in her father; Hypertension in her father, mother, and son; Sleep apnea in her daughter; Stroke in her mother.    ROS:  Please see the history of present illness.   Otherwise, review of systems are positive for none.   All other systems are reviewed and negative.    PHYSICAL EXAM: VS:  BP 118/84 mmHg  Pulse 72  Ht 5\' 4"  (1.626 m)  Wt 194 lb 12.8 oz (88.361 kg)  BMI 33.42 kg/m2  SpO2 96% , BMI Body mass index is 33.42 kg/(m^2). GEN: Well nourished, well developed, in no acute distress HEENT: normal Neck: no JVD, carotid bruits, or masses Cardiac: RRR; no murmurs, rubs, or gallops,no edema  Respiratory:  clear to auscultation bilaterally, normal work of breathing GI: soft, nontender, nondistended, + BS MS: no deformity or atrophy Skin: warm and dry, no rash Neuro:  Strength and sensation are intact Psych: euthymic mood, full affect   EKG:  EKG is ordered today and showed atrial fibrillation at 106bpm with LAFB    Recent Labs: 06/30/2013: Pro B Natriuretic peptide (BNP) 72.0 06/02/2014: B Natriuretic Peptide 520.6*; TSH 2.644 06/08/2014: ALT 12; BUN 55*; Creatinine 1.64*; Hemoglobin 13.0; Magnesium 2.2; Platelets 263; Potassium 4.6; Sodium 135    Lipid Panel    Component Value Date/Time   CHOL 173 02/23/2014 0957   TRIG 174* 02/23/2014 0957   HDL 50 02/23/2014 0957   CHOLHDL 3.5 02/23/2014 0957   VLDL 35 02/23/2014 0957   LDLCALC 88 02/23/2014 0957   LDLDIRECT 100.8 02/14/2014 0930      Wt Readings from Last 3 Encounters:  06/09/14 194 lb  12.8 oz (88.361 kg)  06/08/14 195 lb (88.451 kg)  06/04/14 194 lb 3.2 oz (88.089 kg)     ASSESSMENT AND PLAN:  1.  Chronic diastolic CHF Stage I - 2D echo with normal LVF and diastolic dysfunction. She appears euvolemic on exam but is still SOB.  I will check a BNP. Continue Lasix / CCB 2. HTN well controlled - continue Cardizem.  I will stop her ARB since I am adding lopressor for  rate control and her BP at home has been in the low 100's 3. Moderate carotid artery stenosis on dopplers  - repeat carotid dopplers   4. Dyslipidemia - would recommend LDL < 70 due to vascular disease in carotids/DM and HTN.  She has been in tolerant to lipitor and zocor in the past and does not want to go on a statin.  i will set her up in lipid clinic.       5.  Left arm pain with PAF and now with some CP - ? Anginal equivalent - will get a Lexican myoview       6.  New onset atrial fibrillation rate still not adequately controlled.  Continue Eliquis/Cardizem.  Add lopressor 25mg  BID for better rate control.  Will need to set up for DCCV once she has bee on Eliquis for 4 weeks.    Current medicines are reviewed at length with the patient today.  The patient does not have concerns regarding medicines.  The following changes have been made:  Stop losartan and start lopressor 25mg  BID  Labs/ tests ordered today include: see above assessment and plan No orders of the defined types were placed in this encounter.     Disposition:   FU with PA extender in 3 weeks and if still in afib set up DCCV   Signed, Sueanne Margarita, MD  06/09/2014 4:09 PM    [facility] [place], [place], [place]  [address] Phone: [phone]; Fax: [phone]

## 2014-06-08 NOTE — Patient Instructions (Signed)

## 2014-06-09 ENCOUNTER — Encounter: Payer: Self-pay | Admitting: Cardiology

## 2014-06-09 ENCOUNTER — Ambulatory Visit (INDEPENDENT_AMBULATORY_CARE_PROVIDER_SITE_OTHER): Payer: Medicare Other | Admitting: Cardiology

## 2014-06-09 VITALS — BP 118/84 | HR 72 | Ht 64.0 in | Wt 194.8 lb

## 2014-06-09 DIAGNOSIS — I481 Persistent atrial fibrillation: Secondary | ICD-10-CM

## 2014-06-09 DIAGNOSIS — I10 Essential (primary) hypertension: Secondary | ICD-10-CM | POA: Diagnosis not present

## 2014-06-09 DIAGNOSIS — I6523 Occlusion and stenosis of bilateral carotid arteries: Secondary | ICD-10-CM | POA: Diagnosis not present

## 2014-06-09 DIAGNOSIS — R079 Chest pain, unspecified: Secondary | ICD-10-CM

## 2014-06-09 DIAGNOSIS — E782 Mixed hyperlipidemia: Secondary | ICD-10-CM

## 2014-06-09 DIAGNOSIS — I4819 Other persistent atrial fibrillation: Secondary | ICD-10-CM

## 2014-06-09 DIAGNOSIS — R0602 Shortness of breath: Secondary | ICD-10-CM

## 2014-06-09 DIAGNOSIS — I5032 Chronic diastolic (congestive) heart failure: Secondary | ICD-10-CM | POA: Diagnosis not present

## 2014-06-09 LAB — URINALYSIS, MICROSCOPIC ONLY
CASTS: NONE SEEN
Crystals: NONE SEEN
Squamous Epithelial / LPF: NONE SEEN

## 2014-06-09 LAB — URINALYSIS, ROUTINE W REFLEX MICROSCOPIC
Bilirubin Urine: NEGATIVE
Glucose, UA: NEGATIVE mg/dL
Hgb urine dipstick: NEGATIVE
KETONES UR: NEGATIVE mg/dL
Nitrite: NEGATIVE
Protein, ur: NEGATIVE mg/dL
UROBILINOGEN UA: 0.2 mg/dL (ref 0.0–1.0)
pH: 6.5 (ref 5.0–8.0)

## 2014-06-09 MED ORDER — METOPROLOL TARTRATE 25 MG PO TABS
25.0000 mg | ORAL_TABLET | Freq: Two times a day (BID) | ORAL | Status: DC
Start: 2014-06-09 — End: 2014-06-14

## 2014-06-09 NOTE — Patient Instructions (Addendum)
Medication Instructions:  Your physician has recommended you make the following change in your medication:  1) STOP LOSARTAN 2) START METOPROLOL 25 mg TWICE DAILY  Labwork: TODAY: BMET, BNP  Testing/Procedures: Your physician has requested that you have a carotid duplex. This test is an ultrasound of the carotid arteries in your neck. It looks at blood flow through these arteries that supply the brain with blood. Allow one hour for this exam. There are no restrictions or special instructions.  Your physician has requested that you have a lexiscan myoview. For further information please visit [email or web address]. Please follow instruction sheet, as given.  Follow-Up: You have been referred to LIPID CLINIC.   Your physician recommends that you schedule a follow-up appointment in 1 week for a NURSE VISIT (EKG).  Your physician recommends that you schedule a follow-up appointment in 3 weeks with NP or PA to set up cardioversion.  Any Other Special Instructions Will Be Listed Below (If Applicable).

## 2014-06-10 LAB — BASIC METABOLIC PANEL
BUN: 54 mg/dL — ABNORMAL HIGH (ref 6–23)
CALCIUM: 9.8 mg/dL (ref 8.4–10.5)
CO2: 22 mEq/L (ref 19–32)
CREATININE: 1.7 mg/dL — AB (ref 0.40–1.20)
Chloride: 100 mEq/L (ref 96–112)
GFR: 30.56 mL/min — AB (ref >60.00)
Glucose, Bld: 120 mg/dL — ABNORMAL HIGH (ref 70–99)
Potassium: 4.6 mEq/L (ref 3.5–5.1)
Sodium: 133 mEq/L — ABNORMAL LOW (ref 135–145)

## 2014-06-10 LAB — URINE CULTURE

## 2014-06-10 LAB — BRAIN NATRIURETIC PEPTIDE: PRO B NATRI PEPTIDE: 233 pg/mL — AB (ref 0.0–100.0)

## 2014-06-13 ENCOUNTER — Telehealth: Payer: Self-pay

## 2014-06-13 ENCOUNTER — Other Ambulatory Visit: Payer: Self-pay | Admitting: Pharmacist

## 2014-06-13 DIAGNOSIS — I10 Essential (primary) hypertension: Secondary | ICD-10-CM

## 2014-06-13 DIAGNOSIS — E782 Mixed hyperlipidemia: Secondary | ICD-10-CM

## 2014-06-13 MED ORDER — FUROSEMIDE 40 MG PO TABS: 40.0000 mg | ORAL_TABLET | Freq: Every day | ORAL | Status: DC

## 2014-06-13 NOTE — Telephone Encounter (Signed)
-----   Message from Sueanne Margarita, MD sent at 06/13/2014  8:18 AM EDT ----- Creatinine increased slightly but Losartan stopped only 2 days prior to labs.  BNP is elevated.  Please have patient increase lasix to daily and recheck BMET on Friday 5/13

## 2014-06-13 NOTE — Telephone Encounter (Signed)
Instructed patient to INCREASE LASIX to qd. Patient unable to come to office Friday. BMET scheduled for Monday, May 16. Patient agrees with treatment plan.

## 2014-06-14 ENCOUNTER — Ambulatory Visit (INDEPENDENT_AMBULATORY_CARE_PROVIDER_SITE_OTHER): Payer: Medicare Other | Admitting: *Deleted

## 2014-06-14 ENCOUNTER — Ambulatory Visit: Payer: Medicare Other | Admitting: Pharmacist

## 2014-06-14 VITALS — BP 132/82 | HR 128 | Resp 16 | Wt 191.1 lb

## 2014-06-14 DIAGNOSIS — I481 Persistent atrial fibrillation: Secondary | ICD-10-CM

## 2014-06-14 DIAGNOSIS — I4819 Other persistent atrial fibrillation: Secondary | ICD-10-CM

## 2014-06-14 MED ORDER — RANITIDINE HCL 300 MG PO TABS: 300.0000 mg | ORAL_TABLET | ORAL | Status: AC | PRN

## 2014-06-14 MED ORDER — METOPROLOL TARTRATE 50 MG PO TABS: 50.0000 mg | ORAL_TABLET | Freq: Two times a day (BID) | ORAL | Status: DC

## 2014-06-14 NOTE — Patient Instructions (Signed)
Your physician has recommended you make the following change in your medication:  1.) change metoprolol to 50 mg twice daily  -----YOU CAN TAKE TWO (2) 25 MG TABLETS IN THE MORNING AND TWO (2) 25 MG TABLETS IN THE EVENING TO FINISH THE BOTTLE YOU CURRENTLY HAVE. REMEMBER WHEN YOU PICK UP THE NEW PRESCRIPTION JUST TAKE ONE PILL (50 MG) IN THE MORNING AND ONE PILL (50 MG) IN THE EVENING.   Your physician recommends that you schedule a follow-up appointment in: 2 [place] VISIT/EKG

## 2014-06-14 NOTE — Progress Notes (Signed)
1.) Reason for visit: EKG and BP check after medication changes  2.) Name of MD requesting visit: Dr. Radford Pax   3.) H&P: Pt seen 06/06/14 at OV--afib, medicine changes--(losartan discontinued; started metoprolol 25 mg BID)  4.) ROS related to problem: EKG shows rapid afib today--rate 118-140 bmp; pt denies SOB "i just feel blah", tired  5.) Assessment and plan per MD: increase metoprolol to 50 mg BID, come back on Thursday for another EKG

## 2014-06-15 ENCOUNTER — Ambulatory Visit: Payer: Medicare Other | Admitting: Pharmacist

## 2014-06-16 ENCOUNTER — Ambulatory Visit (INDEPENDENT_AMBULATORY_CARE_PROVIDER_SITE_OTHER): Payer: Medicare Other

## 2014-06-16 ENCOUNTER — Encounter: Payer: Self-pay | Admitting: Cardiology

## 2014-06-16 ENCOUNTER — Encounter (HOSPITAL_COMMUNITY): Payer: Self-pay

## 2014-06-16 ENCOUNTER — Inpatient Hospital Stay (HOSPITAL_COMMUNITY)
Admission: EM | Admit: 2014-06-16 | Discharge: 2014-06-18 | DRG: 309 | Disposition: A | Discharge status: Home / Self Care | Payer: Medicare Other | Attending: Cardiovascular Disease | Admitting: Cardiovascular Disease

## 2014-06-16 VITALS — BP 142/90 | HR 120 | Ht 64.0 in | Wt 191.0 lb

## 2014-06-16 DIAGNOSIS — E119 Type 2 diabetes mellitus without complications: Secondary | ICD-10-CM | POA: Diagnosis present

## 2014-06-16 DIAGNOSIS — I4891 Unspecified atrial fibrillation: Principal | ICD-10-CM | POA: Diagnosis present

## 2014-06-16 DIAGNOSIS — I5032 Chronic diastolic (congestive) heart failure: Secondary | ICD-10-CM | POA: Diagnosis not present

## 2014-06-16 DIAGNOSIS — I6522 Occlusion and stenosis of left carotid artery: Secondary | ICD-10-CM | POA: Diagnosis present

## 2014-06-16 DIAGNOSIS — N183 Chronic kidney disease, stage 3 (moderate): Secondary | ICD-10-CM | POA: Diagnosis not present

## 2014-06-16 DIAGNOSIS — M1990 Unspecified osteoarthritis, unspecified site: Secondary | ICD-10-CM | POA: Diagnosis present

## 2014-06-16 DIAGNOSIS — I272 Other secondary pulmonary hypertension: Secondary | ICD-10-CM | POA: Diagnosis present

## 2014-06-16 DIAGNOSIS — E785 Hyperlipidemia, unspecified: Secondary | ICD-10-CM | POA: Diagnosis present

## 2014-06-16 DIAGNOSIS — Z6833 Body mass index (BMI) 33.0-33.9, adult: Secondary | ICD-10-CM | POA: Diagnosis not present

## 2014-06-16 DIAGNOSIS — I480 Paroxysmal atrial fibrillation: Secondary | ICD-10-CM | POA: Diagnosis present

## 2014-06-16 DIAGNOSIS — I481 Persistent atrial fibrillation: Secondary | ICD-10-CM

## 2014-06-16 DIAGNOSIS — K219 Gastro-esophageal reflux disease without esophagitis: Secondary | ICD-10-CM | POA: Diagnosis present

## 2014-06-16 DIAGNOSIS — E669 Obesity, unspecified: Secondary | ICD-10-CM | POA: Diagnosis present

## 2014-06-16 DIAGNOSIS — R079 Chest pain, unspecified: Secondary | ICD-10-CM

## 2014-06-16 DIAGNOSIS — I340 Nonrheumatic mitral (valve) insufficiency: Secondary | ICD-10-CM | POA: Diagnosis not present

## 2014-06-16 DIAGNOSIS — I10 Essential (primary) hypertension: Secondary | ICD-10-CM | POA: Diagnosis not present

## 2014-06-16 DIAGNOSIS — I4819 Other persistent atrial fibrillation: Secondary | ICD-10-CM

## 2014-06-16 HISTORY — DX: Chronic kidney disease, stage 3 (moderate): N18.3

## 2014-06-16 HISTORY — DX: Gastro-esophageal reflux disease without esophagitis: K21.9

## 2014-06-16 HISTORY — DX: Chronic kidney disease, stage 3 unspecified: N18.30

## 2014-06-16 LAB — CBC
HCT: 35.9 % — ABNORMAL LOW (ref 36.0–46.0)
HEMOGLOBIN: 11.8 g/dL — AB (ref 12.0–15.0)
MCH: 30.6 pg (ref 26.0–34.0)
MCHC: 32.9 g/dL (ref 30.0–36.0)
MCV: 93 fL (ref 78.0–100.0)
Platelets: 204 10*3/uL (ref 150–400)
RBC: 3.86 MIL/uL — AB (ref 3.87–5.11)
RDW: 13.8 % (ref 11.5–15.5)
WBC: 6.7 10*3/uL (ref 4.0–10.5)

## 2014-06-16 LAB — BASIC METABOLIC PANEL
ANION GAP: 12 (ref 5–15)
BUN: 36 mg/dL — ABNORMAL HIGH (ref 6–20)
CALCIUM: 9.6 mg/dL (ref 8.9–10.3)
CHLORIDE: 101 mmol/L (ref 101–111)
CO2: 23 mmol/L (ref 22–32)
CREATININE: 1.78 mg/dL — AB (ref 0.44–1.00)
GFR calc Af Amer: 29 mL/min — ABNORMAL LOW (ref >60)
GFR calc non Af Amer: 25 mL/min — ABNORMAL LOW (ref >60)
GLUCOSE: 116 mg/dL — AB (ref 65–99)
Potassium: 3.9 mmol/L (ref 3.5–5.1)
Sodium: 136 mmol/L (ref 135–145)

## 2014-06-16 MED ORDER — GEMFIBROZIL 600 MG PO TABS
600.0000 mg | ORAL_TABLET | Freq: Every day | ORAL | Status: DC
  Administered 2014-06-16 – 2014-06-18 (×3): 600 mg via ORAL
  Filled 2014-06-16 (×3): qty 1

## 2014-06-16 MED ORDER — GABAPENTIN 300 MG PO CAPS
300.0000 mg | ORAL_CAPSULE | Freq: Every day | ORAL | Status: DC
  Administered 2014-06-16 – 2014-06-17 (×2): 300 mg via ORAL
  Filled 2014-06-16 (×3): qty 1

## 2014-06-16 MED ORDER — SODIUM CHLORIDE 0.9 % IV SOLN
250.0000 mL | INTRAVENOUS | Status: DC | PRN
  Administered 2014-06-17: 09:00:00 via INTRAVENOUS

## 2014-06-16 MED ORDER — VITAMIN C 500 MG PO TABS
500.0000 mg | ORAL_TABLET | Freq: Two times a day (BID) | ORAL | Status: DC
Start: 2014-06-16 — End: 2014-06-18
  Administered 2014-06-16 – 2014-06-18 (×4): 500 mg via ORAL
  Filled 2014-06-16 (×6): qty 1

## 2014-06-16 MED ORDER — DILTIAZEM HCL ER COATED BEADS 240 MG PO CP24
240.0000 mg | ORAL_CAPSULE | Freq: Once | ORAL | Status: AC
  Administered 2014-06-16: 240 mg via ORAL
  Filled 2014-06-16: qty 1

## 2014-06-16 MED ORDER — ALLOPURINOL 150 MG HALF TABLET
150.0000 mg | ORAL_TABLET | Freq: Every day | ORAL | Status: DC
  Administered 2014-06-16 – 2014-06-17 (×2): 150 mg via ORAL
  Filled 2014-06-16 (×3): qty 1

## 2014-06-16 MED ORDER — FAMOTIDINE 20 MG PO TABS
20.0000 mg | ORAL_TABLET | Freq: Every day | ORAL | Status: DC
  Filled 2014-06-16: qty 1

## 2014-06-16 MED ORDER — DILTIAZEM HCL ER COATED BEADS 240 MG PO CP24
240.0000 mg | ORAL_CAPSULE | Freq: Once | ORAL | Status: DC
  Filled 2014-06-16: qty 1

## 2014-06-16 MED ORDER — FUROSEMIDE 40 MG PO TABS
40.0000 mg | ORAL_TABLET | Freq: Every day | ORAL | Status: DC
  Administered 2014-06-16 – 2014-06-18 (×3): 40 mg via ORAL
  Filled 2014-06-16 (×3): qty 1

## 2014-06-16 MED ORDER — APIXABAN 2.5 MG PO TABS
2.5000 mg | ORAL_TABLET | Freq: Two times a day (BID) | ORAL | Status: DC
  Administered 2014-06-16 – 2014-06-18 (×5): 2.5 mg via ORAL
  Filled 2014-06-16 (×6): qty 1

## 2014-06-16 MED ORDER — DILTIAZEM HCL ER COATED BEADS 240 MG PO TB24
240.0000 mg | ORAL_TABLET | Freq: Every day | ORAL | Status: DC
  Administered 2014-06-17 – 2014-06-18 (×2): 240 mg via ORAL
  Filled 2014-06-16 (×2): qty 1

## 2014-06-16 MED ORDER — SODIUM CHLORIDE 0.9 % IV SOLN
INTRAVENOUS | Status: DC
  Administered 2014-06-16: 16:00:00 via INTRAVENOUS

## 2014-06-16 MED ORDER — VITAMIN B-12 1000 MCG PO TABS
1000.0000 ug | ORAL_TABLET | Freq: Every morning | ORAL | Status: DC
  Administered 2014-06-16 – 2014-06-18 (×3): 1000 ug via ORAL
  Filled 2014-06-16 (×3): qty 1

## 2014-06-16 MED ORDER — METOPROLOL TARTRATE 50 MG PO TABS
50.0000 mg | ORAL_TABLET | Freq: Two times a day (BID) | ORAL | Status: DC
  Administered 2014-06-16 – 2014-06-18 (×5): 50 mg via ORAL
  Filled 2014-06-16 (×6): qty 1

## 2014-06-16 MED ORDER — VITAMIN D3 25 MCG (1000 UNIT) PO TABS
4000.0000 [IU] | ORAL_TABLET | Freq: Every morning | ORAL | Status: DC
  Administered 2014-06-16 – 2014-06-18 (×3): 4000 [IU] via ORAL
  Filled 2014-06-16 (×3): qty 4

## 2014-06-16 MED ORDER — SODIUM CHLORIDE 0.9 % IJ SOLN
3.0000 mL | Freq: Two times a day (BID) | INTRAMUSCULAR | Status: DC
  Administered 2014-06-16: 3 mL via INTRAVENOUS

## 2014-06-16 MED ORDER — SODIUM CHLORIDE 0.9 % IJ SOLN: 3.0000 mL | INTRAMUSCULAR | Status: DC | PRN

## 2014-06-16 MED ORDER — SODIUM CHLORIDE 0.9 % IJ SOLN
3.0000 mL | Freq: Two times a day (BID) | INTRAMUSCULAR | Status: DC
  Administered 2014-06-17: 3 mL via INTRAVENOUS

## 2014-06-16 MED ORDER — PANTOPRAZOLE SODIUM 40 MG PO TBEC
40.0000 mg | DELAYED_RELEASE_TABLET | Freq: Every day | ORAL | Status: DC
  Administered 2014-06-17 – 2014-06-18 (×2): 40 mg via ORAL
  Filled 2014-06-16 (×2): qty 1

## 2014-06-16 NOTE — Progress Notes (Signed)
1.) Reason for visit: A-fib/needs f/u EKG  2.) Name of MD requesting visit: Dr Radford Pax  3.) H&P: At the pts last OV with Dr Radford Pax on 06/06/14 the pt was in A-fib and was advised to stop taking Losartan, to start taking Metoprolol 25 mg BID and to have a EKG with nurse in 1 week. Week. When the pt came in for EKG 1 week (06/14/14) later she was in A-fib and was advised to increase her Metoprolol to 50 mg BID and to come back today for another EKG.    4.) ROS related to problem: the pt arrives in the office this am for a f/u EKG. EKG obtained and given to Dr Radford Pax for her review. The pt continues to be in A-fib with a HR of 120 bpm. She denies dizziness, syncope and sob.    5.) Assessment and plan per MD: Per Dr Radford Pax the pt was sent to the ER for further evaluation. The pt is in agreement with plan.Trish, cardiology card master, is aware that the pt is on her way. The pts friend, Blanch Media, drove the pt to the ER from the office.   The pt states that her daughter, Rinaldo Ratel, would like a call from Dr Radford Pax today. The pt is advised that I will give her daughters business card which includes her daughters phone number to Dr Radford Pax. Pam's phone number is [phone].

## 2014-06-16 NOTE — ED Notes (Signed)
Pt was recently diagnosed with Afib and was at [place] office for follow up.  Pt was found to be in Afib with RVR.  Pt denies any chest pain or discomfort at this time.  Pt sts "I feel fine I was just going for a follow up."

## 2014-06-16 NOTE — H&P (Signed)
Physician History and Physical     Patient ID: [name] [name] MRN: [id number] DOB/AGE: 1930-07-27 81 y.o. Admit date: 06/16/2014  Primary Care Physician: Alesia Richards, MD Primary Cardiologist:  Fransico Him    Active Problems:   A-fib   HPI:   [name] [name] is a 81 y.o.  female with a history of HTN, dyslipidemia, obesity, DM, mild pulmonary HTN, moderate TR and chronic diastolic CHF and moderate left carotid artery stenosis who presents today for followup. She was recently in [facility] with new onset atrial fibrillation with RVR. She was started on Apixiban for a CHADS2VASC score of 7. She was started on Cardizem for rate control. Her Atenolol/doxazosin were stopped and Losartan cut back to 50mg  daily due to hypotension. She complained of some left arm pain that was felt to possibly be an anginal equivalent and outpt stress test was recommended. 2D echo showed normal LVF with EF 55-60%. Been seen in office twice now since d/c with elevated HR and palpitations. In ER afib rapid 140's despite higher dose beta blocker and cardiazem.  No current chest pain Myovue has not been done yet  Been compliant with eliquis  Review of systems complete and found to be negative unless listed above   Past Medical History  Diagnosis Date  . Obesity   . Hyperlipidemia   . Type II or unspecified type diabetes mellitus without mention of complication, not stated as uncontrolled   . Allergy   . Arthritis   . Gout   . Vitamin D deficiency   . Carotid artery stenosis     1-39% right and 40-59% left by dopplers 2015  . Chronic diastolic CHF (congestive heart failure), NYHA class 1   . Hypertension     Family History  Problem Relation Age of Onset  . Hypertension Mother   . Stroke Mother   . Asthma Father   . Hypertension Father   . Hyperlipidemia Father   . Heart attack Father   . Diabetes Brother   . Sleep apnea Daughter   . Asthma Son   . Hypertension Son     History   Social  History  . Marital Status: Widowed    Spouse Name: N/A  . Number of Children: N/A  . Years of Education: N/A   Occupational History  . Not on file.   Social History Main Topics  . Smoking status: Never Smoker   . Smokeless tobacco: Never Used  . Alcohol Use: No  . Drug Use: No  . Sexual Activity: No   Other Topics Concern  . Not on file   Social History Narrative    Past Surgical History  Procedure Laterality Date  . Joint replacement    . Dilitation and currage        (Not in a hospital admission)  Physical Exam: Blood pressure 126/81, pulse 121, temperature 97.6 F (36.4 C), temperature source Oral, resp. rate 18, height 5\' 4"  (1.626 m), weight 87.544 kg (193 lb), SpO2 95 %.   Affect appropriate Healthy:  appears stated age 61: normal Neck supple with no adenopathy JVP normal no bruits no thyromegaly Lungs clear with no wheezing and good diaphragmatic motion Heart:  S1/S2 no murmur, no rub, gallop or click PMI normal Abdomen: benighn, BS positve, no tenderness, no AAA no bruit.  No HSM or HJR Distal pulses intact with no bruits No edema Neuro non-focal Skin warm and dry No muscular weakness  No current facility-administered medications on file prior to  encounter.   Current Outpatient Prescriptions on File Prior to Encounter  Medication Sig Dispense Refill  . allopurinol (ZYLOPRIM) 300 MG tablet Take 150 mg by mouth at bedtime.    . ALPRAZolam (XANAX) 1 MG tablet Take 1/2 to 1 tablet at hour of Sleep (Patient taking differently: Take 0.5-1 mg by mouth at bedtime as needed for sleep. ) 30 tablet 5  . apixaban (ELIQUIS) 2.5 MG TABS tablet Take 1 tablet (2.5 mg total) by mouth 2 (two) times daily. 60 tablet 0  . cholecalciferol (VITAMIN D) 1000 UNITS tablet Take 4,000 Units by mouth every morning.     . diltiazem (CARDIZEM LA) 240 MG 24 hr tablet Take 1 tablet (240 mg total) by mouth daily. 60 tablet 0  . Flaxseed, Linseed, (FLAX SEED OIL) 1000 MG CAPS Take  1 capsule by mouth every morning.    . furosemide (LASIX) 40 MG tablet Take 1 tablet (40 mg total) by mouth daily. 30 tablet 6  . gabapentin (NEURONTIN) 300 MG capsule TAKE ONE CAPSULE BY MOUTH EVERY DAY AT BEDTIME 90 capsule 4  . gemfibrozil (LOPID) 600 MG tablet Take 1 tablet (600 mg total) by mouth daily.    . Lancets (FREESTYLE) lancets Check blood glucose 1 time daily.  DX-E11.22 100 each 6  . loperamide (IMODIUM A-D) 2 MG tablet Take 2 mg by mouth 4 (four) times daily as needed for diarrhea or loose stools.    . magnesium oxide (MAG-OX) 400 MG tablet Take 400 mg by mouth daily.    . metoprolol (LOPRESSOR) 50 MG tablet Take 1 tablet (50 mg total) by mouth 2 (two) times daily. 60 tablet 6  . omeprazole (PRILOSEC) 20 MG capsule Take 20 mg by mouth every morning.    . ranitidine (ZANTAC) 300 MG tablet Take 1 tablet (300 mg total) by mouth as needed for heartburn. 60 tablet 3  . vitamin B-12 (CYANOCOBALAMIN) 1000 MCG tablet Take 1,000 mcg by mouth every morning.    . vitamin C (ASCORBIC ACID) 500 MG tablet Take 500 mg by mouth 2 (two) times daily.     . diclofenac sodium (VOLTAREN) 1 % GEL Apply 2 g topically 4 (four) times daily. (Patient not taking: Reported on 06/16/2014) 100 g 3    Labs:   Lab Results  Component Value Date   WBC 6.7 06/16/2014   HGB 11.8* 06/16/2014   HCT 35.9* 06/16/2014   MCV 93.0 06/16/2014   PLT 204 06/16/2014    Recent Labs Lab 06/16/14 1015  NA 136  K 3.9  CL 101  CO2 23  BUN 36*  CREATININE 1.78*  CALCIUM 9.6  GLUCOSE 116*   Lab Results  Component Value Date   TROPONINI <0.03 06/02/2014   TROPONINI <0.03 06/02/2014   TROPONINI <0.03 06/01/2014     Lab Results  Component Value Date   CHOL 173 02/23/2014   CHOL 196 02/14/2014   CHOL 148 11/11/2013   Lab Results  Component Value Date   HDL 50 02/23/2014   HDL 41.60 02/14/2014   HDL 53 11/11/2013   Lab Results  Component Value Date   LDLCALC 88 02/23/2014   LDLCALC 75 11/11/2013    LDLCALC 68 08/03/2013   Lab Results  Component Value Date   TRIG 174* 02/23/2014   TRIG 246.0* 02/14/2014   TRIG 100 11/11/2013   Lab Results  Component Value Date   CHOLHDL 3.5 02/23/2014   CHOLHDL 5 02/14/2014   CHOLHDL 2.8 11/11/2013   Lab Results  Component Value Date   LDLDIRECT 100.8 02/14/2014       Radiology: Dg Chest Portable 1 View  06/01/2014   CLINICAL DATA:  Bilateral shoulder and arm pain beginning today. Irregular heart rate today.  EXAM: PORTABLE CHEST - 1 VIEW  COMPARISON:  PA and lateral chest 05/09/2012.  FINDINGS: There is cardiomegaly without edema. Lungs are clear. No pneumothorax or pleural effusion.  IMPRESSION: Cardiomegaly without acute disease.   Electronically Signed   By: Inge Rise M.D.   On: 06/01/2014 12:38    EKG: Afib rapid rate nonspecific ST/T wave changes  ASSESSMENT AND PLAN:   AFib: rencent admission and two office visits  Hard to control rate.  Will admit for TEE/DCC in am with Dr Harrington Challenger Risks including stoke discussed with patient and family friend willing to proceed.  Has not been on eliquis for 3 weeks yet  Chest Pain:  Not clear if this is angina Has outpatient myovue scheduled    GERD :  Continue omeprazole  Signed: Collier Salina Nishan5/01/2015, 12:13 PM

## 2014-06-16 NOTE — ED Notes (Signed)
Cardiology at bedside.

## 2014-06-16 NOTE — Progress Notes (Signed)
Signed consent on the chart for TEE/DCC scheduled for 06/17/14.  Alvy Beal, RN

## 2014-06-16 NOTE — ED Notes (Signed)
Attempted to call report, nurse is off the floor and will call back when they return.

## 2014-06-16 NOTE — ED Provider Notes (Signed)
CSN: [id number]     Arrival date & time 06/16/14  1607 History   First MD Initiated Contact with Patient 06/16/14 [phone]     Chief Complaint  Patient presents with  . Atrial Fibrillation     (Consider location/radiation/quality/duration/timing/severity/associated sxs/prior Treatment) Patient is a 81 y.o. female presenting with atrial fibrillation. The history is provided by the patient.  Atrial Fibrillation This is a recurrent problem. The current episode started more than 1 week ago. The problem occurs constantly. The problem has not changed since onset.Pertinent negatives include no abdominal pain and no shortness of breath. Nothing aggravates the symptoms. Nothing relieves the symptoms.    Past Medical History  Diagnosis Date  . Obesity   . Hyperlipidemia   . Type II or unspecified type diabetes mellitus without mention of complication, not stated as uncontrolled   . Allergy   . Arthritis   . Gout   . Vitamin D deficiency   . Carotid artery stenosis     1-39% right and 40-59% left by dopplers 2015  . Chronic diastolic CHF (congestive heart failure), NYHA class 1   . Hypertension    Past Surgical History  Procedure Laterality Date  . Joint replacement    . Dilitation and currage     Family History  Problem Relation Age of Onset  . Hypertension Mother   . Stroke Mother   . Asthma Father   . Hypertension Father   . Hyperlipidemia Father   . Heart attack Father   . Diabetes Brother   . Sleep apnea Daughter   . Asthma Son   . Hypertension Son    History  Substance Use Topics  . Smoking status: Never Smoker   . Smokeless tobacco: Never Used  . Alcohol Use: No   OB History    No data available     Review of Systems  Constitutional: Negative for fever.  Respiratory: Negative for cough and shortness of breath.   Gastrointestinal: Negative for vomiting and abdominal pain.  All other systems reviewed and are negative.     Allergies  Codeine; Lisinopril; and  Tylenol pm extra  Home Medications   Prior to Admission medications   Medication Sig Start Date End Date Taking? Authorizing Provider  allopurinol (ZYLOPRIM) 300 MG tablet Take 150 mg by mouth at bedtime.    Historical Provider, MD  ALPRAZolam Duanne Moron) 1 MG tablet Take 1/2 to 1 tablet at hour of Sleep Patient taking differently: Take 0.5-1 mg by mouth at bedtime as needed for sleep.  12/24/13   Unk Pinto, MD  apixaban (ELIQUIS) 2.5 MG TABS tablet Take 1 tablet (2.5 mg total) by mouth 2 (two) times daily. 06/04/14   Nita Sells, MD  cholecalciferol (VITAMIN D) 1000 UNITS tablet Take 4,000 Units by mouth every morning.     Historical Provider, MD  diclofenac sodium (VOLTAREN) 1 % GEL Apply 2 g topically 4 (four) times daily. 06/08/14   Vicie Mutters, PA-C  diltiazem (CARDIZEM LA) 240 MG 24 hr tablet Take 1 tablet (240 mg total) by mouth daily. 06/04/14   Nita Sells, MD  Flaxseed, Linseed, (FLAX SEED OIL) 1000 MG CAPS Take 1 capsule by mouth every morning.    Historical Provider, MD  furosemide (LASIX) 40 MG tablet Take 1 tablet (40 mg total) by mouth daily. 06/13/14   Sueanne Margarita, MD  gabapentin (NEURONTIN) 300 MG capsule TAKE ONE CAPSULE BY MOUTH EVERY DAY AT BEDTIME 03/07/14   Vicie Mutters, PA-C  gemfibrozil (LOPID) 600  MG tablet Take 1 tablet (600 mg total) by mouth daily. 02/18/14   Sueanne Margarita, MD  Lancets (FREESTYLE) lancets Check blood glucose 1 time daily.  TM-H96.22 02/07/14   Unk Pinto, MD  loperamide (IMODIUM A-D) 2 MG tablet Take 2 mg by mouth 4 (four) times daily as needed for diarrhea or loose stools.    Historical Provider, MD  magnesium oxide (MAG-OX) 400 MG tablet Take 400 mg by mouth daily.    Historical Provider, MD  metoprolol (LOPRESSOR) 50 MG tablet Take 1 tablet (50 mg total) by mouth 2 (two) times daily. 06/14/14   Sueanne Margarita, MD  omeprazole (PRILOSEC) 20 MG capsule Take 20 mg by mouth every morning.    Historical Provider, MD  ranitidine  (ZANTAC) 300 MG tablet Take 1 tablet (300 mg total) by mouth as needed for heartburn. 06/14/14   Sueanne Margarita, MD  vitamin B-12 (CYANOCOBALAMIN) 1000 MCG tablet Take 1,000 mcg by mouth every morning.    Historical Provider, MD  vitamin C (ASCORBIC ACID) 500 MG tablet Take 500 mg by mouth 2 (two) times daily.     Historical Provider, MD   BP 131/77 mmHg  Pulse 127  Temp(Src) 97.6 F (36.4 C) (Oral)  Resp 16  Ht 5\' 4"  (1.626 m)  Wt 193 lb (87.544 kg)  BMI 33.11 kg/m2  SpO2 97% Physical Exam  Constitutional: She is oriented to person, place, and time. She appears well-developed and well-nourished. No distress.  HENT:  Head: Normocephalic and atraumatic.  Mouth/Throat: Oropharynx is clear and moist.  Eyes: EOM are normal. Pupils are equal, round, and reactive to light.  Neck: Normal range of motion. Neck supple.  Cardiovascular: An irregularly irregular rhythm present. Tachycardia present.  Exam reveals no friction rub.   No murmur heard. Pulmonary/Chest: Effort normal and breath sounds normal. No respiratory distress. She has no wheezes. She has no rales.  Abdominal: Soft. She exhibits no distension. There is no tenderness. There is no rebound.  Musculoskeletal: Normal range of motion. She exhibits no edema.  Neurological: She is alert and oriented to person, place, and time.  Skin: No rash noted. She is not diaphoretic.  Nursing note and vitals reviewed.   ED Course  Procedures (including critical care time) Labs Review Labs Reviewed  CBC  BASIC METABOLIC PANEL    Imaging Review No results found.   EKG Interpretation   Date/Time:  Thursday Jun 16 2014 09:59:17 EDT Ventricular Rate:  128 PR Interval:    QRS Duration: 106 QT Interval:  334 QTC Calculation: 487 R Axis:   -30 Text Interpretation:  Atrial fibrillation LVH with secondary  repolarization abnormality Anterior Q waves, possibly due to LVH Afib with  RVR, similar to prior episodes of RVR 2 weeks ago  Confirmed by [facility]  MD,  [place] ([id number]) on 06/16/2014 10:08:19 AM      MDM   Final diagnoses:  Atrial fibrillation with RVR    44F here with Atrial Fib with RVR. Sent from Dr. Theodosia Blender office. Afib is new in the past month. She's been on Eliquis for anti-coagluation. She had a routine office visit and was found to be in RVR. She denies SOB, CP, palpitations.  BP ok. She hasn't taken her medication yet today, she takes 240 mg of PO long-acting cardizem. Will give her that dose.  Cards evaluated and will admit.  Evelina Bucy, MD 06/16/14 [phone]

## 2014-06-17 ENCOUNTER — Inpatient Hospital Stay (HOSPITAL_COMMUNITY): Payer: Medicare Other | Admitting: Certified Registered"

## 2014-06-17 ENCOUNTER — Encounter (HOSPITAL_COMMUNITY): Payer: Self-pay | Admitting: *Deleted

## 2014-06-17 ENCOUNTER — Inpatient Hospital Stay (HOSPITAL_COMMUNITY): Payer: Medicare Other

## 2014-06-17 ENCOUNTER — Encounter (HOSPITAL_COMMUNITY)
Admission: EM | Disposition: A | Discharge status: Home / Self Care | Payer: Self-pay | Source: Home / Self Care | Attending: Cardiovascular Disease

## 2014-06-17 DIAGNOSIS — I340 Nonrheumatic mitral (valve) insufficiency: Secondary | ICD-10-CM

## 2014-06-17 DIAGNOSIS — I4891 Unspecified atrial fibrillation: Principal | ICD-10-CM

## 2014-06-17 HISTORY — PX: CARDIOVERSION: SHX1299

## 2014-06-17 HISTORY — PX: TEE WITHOUT CARDIOVERSION: SHX5443

## 2014-06-17 SURGERY — ECHOCARDIOGRAM, TRANSESOPHAGEAL
Anesthesia: Monitor Anesthesia Care

## 2014-06-17 MED ORDER — PROPOFOL 10 MG/ML IV BOLUS
INTRAVENOUS | Status: DC | PRN
  Administered 2014-06-17: 20 mg via INTRAVENOUS

## 2014-06-17 MED ORDER — BUTAMBEN-TETRACAINE-BENZOCAINE 2-2-14 % EX AERO
INHALATION_SPRAY | CUTANEOUS | Status: DC | PRN
  Administered 2014-06-17: 2 via TOPICAL

## 2014-06-17 MED ORDER — PROPOFOL INFUSION 10 MG/ML OPTIME
INTRAVENOUS | Status: DC | PRN
  Administered 2014-06-17: 25 ug/kg/min via INTRAVENOUS

## 2014-06-17 NOTE — Anesthesia Preprocedure Evaluation (Addendum)
Anesthesia Evaluation  Patient identified by MRN, date of birth, ID band Patient awake    Airway Mallampati: II  TM Distance: >3 FB Neck ROM: Full    Dental   Pulmonary neg pulmonary ROS,  breath sounds clear to auscultation        Cardiovascular hypertension, + Peripheral Vascular Disease and +CHF + dysrhythmias Atrial Fibrillation Rhythm:Regular Rate:Normal     Neuro/Psych    GI/Hepatic GERD-  ,  Endo/Other  diabetes  Renal/GU Renal disease     Musculoskeletal   Abdominal   Peds  Hematology   Anesthesia Other Findings   Reproductive/Obstetrics                             Anesthesia Physical Anesthesia Plan  ASA: III  Anesthesia Plan: MAC   Post-op Pain Management:    Induction: Intravenous  Airway Management Planned: Simple Face Mask  Additional Equipment:   Intra-op Plan:   Post-operative Plan:   Informed Consent: I have reviewed the patients History and Physical, chart, labs and discussed the procedure including the risks, benefits and alternatives for the proposed anesthesia with the patient or authorized representative who has indicated his/her understanding and acceptance.   Dental advisory given  Plan Discussed with: CRNA and Anesthesiologist  Anesthesia Plan Comments:         Anesthesia Quick Evaluation

## 2014-06-17 NOTE — Op Note (Signed)
LA LAA without masses. Mild MR  Tr PI LVEF normal to mildly depressed Full report to follow

## 2014-06-17 NOTE — Consult Note (Signed)
   [facility] CM Inpatient Consult   06/17/2014  TELENA PEYSER 1930-07-30 [id number] Patient evaluated for [facility] Management services.  Patient had procedures today and is resting. Spoke with inpatient RNCM about [facility] as a benefit of the patient's Medicare insurance.  She states no current needs identified and will follow up with [facility] Liaison if needs arises.  Of note, [facility] Care Management does not replace or interfere with any services arranged by the inpatient care management team.  For question, please contact: Natividad Brood, RN BSN [facility] Liaison  [phone] business mobile phone

## 2014-06-17 NOTE — Progress Notes (Signed)
  Echocardiogram Echocardiogram Transesophageal has been performed.  [name] [name] M 06/17/2014, 9:27 AM

## 2014-06-17 NOTE — Interval H&P Note (Signed)
History and Physical Interval Note:  06/17/2014 7:56 AM  [name] [name]  has presented today for surgery, with the diagnosis of AFIB  The various methods of treatment have been discussed with the patient and family. After consideration of risks, benefits and other options for treatment, the patient has consented to  Procedure(s): TRANSESOPHAGEAL ECHOCARDIOGRAM (TEE) (N/A) CARDIOVERSION (N/A) as a surgical intervention .  The patient's history has been reviewed, patient examined, no change in status, stable for surgery.  I have reviewed the patient's chart and labs.  Questions were answered to the patient's satisfaction.     [name]

## 2014-06-17 NOTE — Progress Notes (Signed)
Utilization review completed.  

## 2014-06-17 NOTE — Interval H&P Note (Signed)
History and Physical Interval Note:  06/17/2014 8:37 AM  [name] [name]  has presented today for surgery, with the diagnosis of AFIB  The various methods of treatment have been discussed with the patient and family. After consideration of risks, benefits and other options for treatment, the patient has consented to  Procedure(s): TRANSESOPHAGEAL ECHOCARDIOGRAM (TEE) (N/A) CARDIOVERSION (N/A) as a surgical intervention .  The patient's history has been reviewed, patient examined, no change in status, stable for surgery.  I have reviewed the patient's chart and labs.  Questions were answered to the patient's satisfaction.     [name]

## 2014-06-17 NOTE — Transfer of Care (Signed)
Immediate Anesthesia Transfer of Care Note  Patient: [name] [name]  Procedure(s) Performed: Procedure(s): TRANSESOPHAGEAL ECHOCARDIOGRAM (TEE) (N/A) CARDIOVERSION (N/A)  Patient Location: PACU  Anesthesia Type:MAC  Level of Consciousness: awake, alert  and oriented  Airway & Oxygen Therapy: Patient Spontanous Breathing and Patient connected to nasal cannula oxygen  Post-op Assessment: Report given to RN, Post -op Vital signs reviewed and stable and Patient moving all extremities  Post vital signs: Reviewed and stable  Last Vitals:  Filed Vitals:   06/17/14 0824  BP: 125/90  Pulse: 132  Temp: 36.4 C  Resp: 22    Complications: No apparent anesthesia complications

## 2014-06-17 NOTE — Progress Notes (Signed)
    Doing well. Eating well. Ambulated to bathroom. NSR Hopeful DC in am if tolerating medications well.  Candee Furbish, MD

## 2014-06-17 NOTE — Op Note (Signed)
Patient anesthetized with propofol by anesthesia WIth pads in AP position, pt cardioverted to SR with 200 J syncrhonized biphasic energy 12 lead EKG pending   Procedure without complication

## 2014-06-17 NOTE — Anesthesia Postprocedure Evaluation (Signed)
  Anesthesia Post-op Note  Patient: [name] [name]  Procedure(s) Performed: Procedure(s): TRANSESOPHAGEAL ECHOCARDIOGRAM (TEE) (N/A) CARDIOVERSION (N/A)  Patient Location: PACU and Endoscopy Unit  Anesthesia Type:MAC  Level of Consciousness: awake  Airway and Oxygen Therapy: Patient Spontanous Breathing  Post-op Pain: mild  Post-op Assessment: Post-op Vital signs reviewed  Post-op Vital Signs: Reviewed  Last Vitals:  Filed Vitals:   06/17/14 0920  BP: 126/66  Pulse: 65  Temp:   Resp: 15    Complications: No apparent anesthesia complications

## 2014-06-17 NOTE — Progress Notes (Signed)
    Cardioversion and TEE today. Dr. Harrington Challenger See admit note  Candee Furbish, MD

## 2014-06-18 DIAGNOSIS — I5032 Chronic diastolic (congestive) heart failure: Secondary | ICD-10-CM

## 2014-06-18 DIAGNOSIS — N183 Chronic kidney disease, stage 3 (moderate): Secondary | ICD-10-CM

## 2014-06-18 NOTE — Progress Notes (Signed)
Primary Cardiologist: Fransico Him MD  Cardiology Specific Problem List:  1. Atrial fib-CHADS VASC Score 7 2. S/P DCCV-Successful  3, Hypertension 4. Diastolic CHF 5. Carotid Artery Stenosis  Subjective:     Feels very well. No complaints of chest pain. Upper lip sore. Has been up in the room without issue.   Objective:   Temp:  [97.6 F (36.4 C)-98.4 F (36.9 C)] 98.4 F (36.9 C) (05/14 0434) Pulse Rate:  [53-132] 68 (05/14 0600) Resp:  [9-22] 19 (05/14 0434) BP: (94-141)/(42-90) 120/62 mmHg (05/14 0600) SpO2:  [96 %-100 %] 96 % (05/14 0434) Last BM Date: 06/17/14  Filed Weights   06/16/14 1000 06/16/14 1346  Weight: 193 lb (87.544 kg) 193 lb (87.544 kg)    Intake/Output Summary (Last 24 hours) at 06/18/14 0814 Last data filed at 06/18/14 0700  Gross per 24 hour  Intake   1123 ml  Output    650 ml  Net    473 ml    Telemetry: NSR rates in the 70's and 80's.   Exam:  General: No acute distress.  HEENT: Conjunctiva and lids normal, oropharynx clear.  Lungs: Clear to auscultation, nonlabored.  Cardiac: No elevated JVP or bruits. RRR, no gallop or rub.   Abdomen: Normoactive bowel sounds, nontender, nondistended.  Extremities: No pitting edema, distal pulses full.  Neuropsychiatric: Alert and oriented x3, affect appropriate.   Lab Results:  Basic Metabolic Panel:  Recent Labs Lab 06/16/14 1015  NA 136  K 3.9  CL 101  CO2 23  GLUCOSE 116*  BUN 36*  CREATININE 1.78*  CALCIUM 9.6    Liver Function Tests: No results for input(s): AST, ALT, ALKPHOS, BILITOT, PROT, ALBUMIN in the last 168 hours.  CBC:  Recent Labs Lab 06/16/14 1015  WBC 6.7  HGB 11.8*  HCT 35.9*  MCV 93.0  PLT 204    Cardiac Enzymes: No results for input(s): CKTOTAL, CKMB, CKMBINDEX, TROPONINI in the last 168 hours.  BNP:  Recent Labs  06/30/13 0942 06/09/14 1704  PROBNP 72.0 233.0*  :   Medications:   Scheduled Medications: . allopurinol  150 mg  Oral QHS  . apixaban  2.5 mg Oral BID  . cholecalciferol  4,000 Units Oral q morning - 10a  . diltiazem  240 mg Oral Once  . diltiazem  240 mg Oral Daily  . furosemide  40 mg Oral Daily  . gabapentin  300 mg Oral QHS  . gemfibrozil  600 mg Oral Daily  . metoprolol  50 mg Oral BID  . pantoprazole  40 mg Oral Daily  . sodium chloride  3 mL Intravenous Q12H  . sodium chloride  3 mL Intravenous Q12H  . vitamin B-12  1,000 mcg Oral q morning - 10a  . vitamin C  500 mg Oral BID    PRN Medications: sodium chloride, sodium chloride   Assessment and Plan:   1.Atrial fibrillation: S/P DCCV: Successful DCCV on 5.13.2016 per Dr. Harrington Challenger. She remains in NSR rates in the 70's and 80's. Continue Eliquis. CHADS VASC Score of 7. She has a prescheduled stress test in one week with Dr. Radford Pax. Follow up appt will be made post test. Continue metoprolol and diltiazem.   2. Hypertension: Well controlled. No changes in regimen.  3. Diastolic CHF: Remains on lasix Lab follow up on Monday, May 16th.     Phill Myron. [place] NP [place]  06/18/2014, 8:14 AM    The patient was seen, examined and discussed  with Jory Sims, NP and I agree with the above.   The patient remains in SR with occasional pauses less than 2 seconds on telemetry, no more a-fib episodes. She feels well, denies any dizziness or palpitations. She will be discharged home today. The patient is euvolemic, I would hold lasix at discharge as her Crea is 1.78 from 1.58. She is scheduled for labs on Monday, we will arrange for a follow up in 1 week.  Dorothy Spark 06/18/2014

## 2014-06-18 NOTE — Discharge Summary (Signed)
Physician Discharge Summary  Patient ID: [name] [name] MRN: [id number] DOB/AGE: 1930-10-05 81 y.o.  Admit date: 06/16/2014 Discharge date: 06/18/2014  Primary Discharge Diagnosis  1.. Atrial fib - CHADS VASC Score 7 2. S/P Successful DCCV 06/17/2014  Secondary Discharge Diagnosis 1. Hypertension 2. Diastolic CHF 3. Carotid Artery Stenosis  Primary Cardiologist: Fransico Him  Significant Diagnostic Studies:  1. TEE: 06/17/2014 Dr.Ross No cardiac source of emboli was indentified.   2. DCCV 06/17/2014  Patient anesthetized with propofol by anesthesia WIth pads in AP position, pt cardioverted to SR with 200 J syncrhonized biphasic energy 12 lead EKG pending  Procedure without complication   Hospital Course:      [name] [name] is a 81 y.o. female with a history of HTN, dyslipidemia, obesity, DM, mild pulmonary HTN, moderate TR and chronic diastolic CHF and moderate left carotid artery stenosis on Apixiban for a CHADS2VASC score of 7. Admitted for TEE/DCCV in the setting of continued atrial fib.     TEE/DCCV was completed by Dr. Harrington Challenger on 06/17/14 which was successful She was continued on pre-hospitalization medications with the exception of lasix. She was seen by Dr. Meda Coffee on day of discharge with Creatinine of 1.78. As a result of this we placed lasix on hold. She has follow up lab work scheduled on Jun 20, 2014. Further recommendations concerning continuing lasix will be made post results. She also has a stress test schedule on Jun 27, 2014 that had been pre-planned prior to admission for TEE/.DCCV.    On day of discharge she remained in NSR rates in the 70;s and 80's. She was ambulating with a cane in her room. She had no complaints. She was seen and examined by Dr. Meda Coffee and found stable to return home. Close follow up is planned.   Discharge Exam: Blood pressure 120/62, pulse 68, temperature 98.4 F (36.9 C), temperature source Oral, resp. rate 19, height 5\' 4"  (1.626 m),  weight 193 lb (87.544 kg), SpO2 96 %.    Labs:   Lab Results  Component Value Date   WBC 6.7 06/16/2014   HGB 11.8* 06/16/2014   HCT 35.9* 06/16/2014   MCV 93.0 06/16/2014   PLT 204 06/16/2014     Recent Labs Lab 06/16/14 1015  NA 136  K 3.9  CL 101  CO2 23  BUN 36*  CREATININE 1.78*  CALCIUM 9.6  GLUCOSE 116*   Lab Results  Component Value Date   TROPONINI <0.03 06/02/2014    Lab Results  Component Value Date   CHOL 173 02/23/2014   CHOL 196 02/14/2014   CHOL 148 11/11/2013   Lab Results  Component Value Date   HDL 50 02/23/2014   HDL 41.60 02/14/2014   HDL 53 11/11/2013   Lab Results  Component Value Date   LDLCALC 88 02/23/2014   LDLCALC 75 11/11/2013   LDLCALC 68 08/03/2013   Lab Results  Component Value Date   TRIG 174* 02/23/2014   TRIG 246.0* 02/14/2014   TRIG 100 11/11/2013   Lab Results  Component Value Date   CHOLHDL 3.5 02/23/2014   CHOLHDL 5 02/14/2014   CHOLHDL 2.8 11/11/2013   Lab Results  Component Value Date   LDLDIRECT 100.8 02/14/2014      Radiology: Dg Chest Portable 1 View  06/01/2014   CLINICAL DATA:  Bilateral shoulder and arm pain beginning today. Irregular heart rate today.  EXAM: PORTABLE CHEST - 1 VIEW  COMPARISON:  PA and lateral chest 05/09/2012.  FINDINGS: There is cardiomegaly without edema. Lungs are clear. No pneumothorax or pleural effusion.  IMPRESSION: Cardiomegaly without acute disease.   Electronically Signed   By: Inge Rise M.D.   On: 06/01/2014 12:38    FOLLOW UP PLANS AND APPOINTMENTS     Discharge Instructions    Diet - low sodium heart healthy    Complete by:  As directed      Increase activity slowly    Complete by:  As directed             Medication List    STOP taking these medications        furosemide 40 MG tablet  Commonly known as:  LASIX      TAKE these medications        allopurinol 300 MG tablet  Commonly known as:  ZYLOPRIM  Take 150 mg by mouth at bedtime.      ALPRAZolam 1 MG tablet  Commonly known as:  XANAX  Take 1/2 to 1 tablet at hour of Sleep     apixaban 2.5 MG Tabs tablet  Commonly known as:  ELIQUIS  Take 1 tablet (2.5 mg total) by mouth 2 (two) times daily.     cholecalciferol 1000 UNITS tablet  Commonly known as:  VITAMIN D  Take 4,000 Units by mouth every morning.     diclofenac sodium 1 % Gel  Commonly known as:  VOLTAREN  Apply 2 g topically 4 (four) times daily.     diltiazem 240 MG 24 hr tablet  Commonly known as:  CARDIZEM LA  Take 1 tablet (240 mg total) by mouth daily.     Flax Seed Oil 1000 MG Caps  Take 1 capsule by mouth every morning.     freestyle lancets  Check blood glucose 1 time daily.  DX-E11.22     gabapentin 300 MG capsule  Commonly known as:  NEURONTIN  TAKE ONE CAPSULE BY MOUTH EVERY DAY AT BEDTIME     loperamide 2 MG tablet  Commonly known as:  IMODIUM A-D  Take 2 mg by mouth 4 (four) times daily as needed for diarrhea or loose stools.     LOPID 600 MG tablet  Generic drug:  gemfibrozil  Take 1 tablet (600 mg total) by mouth daily.     magnesium oxide 400 MG tablet  Commonly known as:  MAG-OX  Take 400 mg by mouth daily.     metoprolol 50 MG tablet  Commonly known as:  LOPRESSOR  Take 1 tablet (50 mg total) by mouth 2 (two) times daily.     omeprazole 20 MG capsule  Commonly known as:  PRILOSEC  Take 20 mg by mouth every morning.     ranitidine 300 MG tablet  Commonly known as:  ZANTAC  Take 1 tablet (300 mg total) by mouth as needed for heartburn.     vitamin B-12 1000 MCG tablet  Commonly known as:  CYANOCOBALAMIN  Take 1,000 mcg by mouth every morning.     vitamin C 500 MG tablet  Commonly known as:  ASCORBIC ACID  Take 500 mg by mouth 2 (two) times daily.       Follow-up Information    Follow up with Richardson Dopp, PA-C On 07/15/2014.   Specialties:  Physician Assistant, Radiology, Interventional Cardiology   Why:  8:50am   Contact information:   [address] [place] [address] [phone]       Follow up On 06/20/2014.  Why:  Keep lab appt on May 16      Follow up with [facility] On 06/27/2014.   Specialty:  Cardiology   Why:  Keep appt for stress test previously scheduled.   Contact information:   [place] [id number] [place] [phone]        Time spent with patient to include physician time: 40 minutes Signed: Phill Myron. Dontrey Snellgrove NP [place]  06/18/2014, 9:35 AM Co-Sign MD

## 2014-06-19 MED ORDER — CIPROFLOXACIN HCL 250 MG PO TABS: 250.0000 mg | ORAL_TABLET | Freq: Two times a day (BID) | ORAL | Status: AC

## 2014-06-19 NOTE — Addendum Note (Signed)
Addended by: Vicie Mutters R on: 06/19/2014 05:22 PM   Modules accepted: Orders

## 2014-06-20 ENCOUNTER — Other Ambulatory Visit (INDEPENDENT_AMBULATORY_CARE_PROVIDER_SITE_OTHER): Payer: Medicare Other | Admitting: *Deleted

## 2014-06-20 ENCOUNTER — Other Ambulatory Visit: Payer: Medicare Other

## 2014-06-20 ENCOUNTER — Encounter (HOSPITAL_COMMUNITY): Payer: Self-pay | Admitting: Internal Medicine

## 2014-06-20 DIAGNOSIS — E782 Mixed hyperlipidemia: Secondary | ICD-10-CM

## 2014-06-20 LAB — LIPID PANEL
CHOLESTEROL: 136 mg/dL (ref 0–200)
HDL: 56.5 mg/dL (ref >39.00)
LDL Cholesterol: 64 mg/dL (ref 0–99)
NonHDL: 79.5
Total CHOL/HDL Ratio: 2
Triglycerides: 80 mg/dL (ref 0.0–149.0)
VLDL: 16 mg/dL (ref 0.0–40.0)

## 2014-06-23 ENCOUNTER — Telehealth (HOSPITAL_COMMUNITY): Payer: Self-pay | Admitting: *Deleted

## 2014-06-23 NOTE — Telephone Encounter (Signed)
Left message for patient to call back to be given instructions for upcoming appointment on 06/27/14. Crissie Figures, RN

## 2014-06-27 ENCOUNTER — Ambulatory Visit (HOSPITAL_BASED_OUTPATIENT_CLINIC_OR_DEPARTMENT_OTHER): Payer: Medicare Other

## 2014-06-27 ENCOUNTER — Ambulatory Visit (HOSPITAL_COMMUNITY): Payer: Medicare Other | Attending: Cardiovascular Disease

## 2014-06-27 ENCOUNTER — Other Ambulatory Visit: Payer: Self-pay | Admitting: Internal Medicine

## 2014-06-27 ENCOUNTER — Other Ambulatory Visit: Payer: Self-pay | Admitting: Emergency Medicine

## 2014-06-27 DIAGNOSIS — R079 Chest pain, unspecified: Secondary | ICD-10-CM | POA: Diagnosis not present

## 2014-06-27 DIAGNOSIS — E785 Hyperlipidemia, unspecified: Secondary | ICD-10-CM | POA: Diagnosis not present

## 2014-06-27 DIAGNOSIS — I6523 Occlusion and stenosis of bilateral carotid arteries: Secondary | ICD-10-CM

## 2014-06-27 DIAGNOSIS — Z8673 Personal history of transient ischemic attack (TIA), and cerebral infarction without residual deficits: Secondary | ICD-10-CM | POA: Diagnosis not present

## 2014-06-27 DIAGNOSIS — I10 Essential (primary) hypertension: Secondary | ICD-10-CM | POA: Diagnosis not present

## 2014-06-27 DIAGNOSIS — I4891 Unspecified atrial fibrillation: Secondary | ICD-10-CM | POA: Diagnosis not present

## 2014-06-27 DIAGNOSIS — E119 Type 2 diabetes mellitus without complications: Secondary | ICD-10-CM | POA: Insufficient documentation

## 2014-06-27 LAB — MYOCARDIAL PERFUSION IMAGING
LHR: 0.32
LVDIAVOL: 95 mL
LVSYSVOL: 29 mL
Nuc Stress EF: 69 %
Peak HR: 67 {beats}/min
Rest HR: 52 {beats}/min
SDS: 4
SRS: 5
SSS: 9
TID: 0.93

## 2014-06-27 MED ORDER — REGADENOSON 0.4 MG/5ML IV SOLN
0.4000 mg | Freq: Once | INTRAVENOUS | Status: AC
  Administered 2014-06-27: 0.4 mg via INTRAVENOUS

## 2014-06-27 MED ORDER — TECHNETIUM TC 99M SESTAMIBI GENERIC - CARDIOLITE
33.0000 | Freq: Once | INTRAVENOUS | Status: AC | PRN
  Administered 2014-06-27: 33 via INTRAVENOUS

## 2014-06-27 MED ORDER — TECHNETIUM TC 99M SESTAMIBI GENERIC - CARDIOLITE
11.0000 | Freq: Once | INTRAVENOUS | Status: AC | PRN
  Administered 2014-06-27: 11 via INTRAVENOUS

## 2014-06-27 NOTE — Progress Notes (Unsigned)
Patient is now >80% in the RICA, Dr.Turner was notified patient is non-symptomatic. gmg

## 2014-06-28 ENCOUNTER — Telehealth: Payer: Self-pay | Admitting: *Deleted

## 2014-06-28 NOTE — Telephone Encounter (Signed)
Received a call from our [facility] at [facility] office.She stated Dr.Turner request appointment with Dr.Berry as soon as possible for progression of RICA stenosis > 80 %.Spoke to Dr.Berry's nurse Curt Bears appointment scheduled with Dr.Berry 07/08/14 at 2:15 pm.Patient's daughter Jeannene Patella called at [phone] left appointment on her personal voice mail.

## 2014-06-28 NOTE — Telephone Encounter (Signed)
OK 

## 2014-06-28 NOTE — Telephone Encounter (Signed)
Ivin Booty from [employer] office called. Noted based on yesterday's carotid doppler, Dr. Radford Pax wanted this patient to be scheduled to see Dr. Gwenlyn Found asap - next available 30 min appt June 10th, provider hold. Wants to know if OK to schedule - advised them I would defer to Dr. Cecil Cobbs to look at best option.  Carotid Doppler done 5/23:  Notes Recorded by Sueanne Margarita, MD on 06/28/2014 at 10:41 AM Mixed plaque noted, bilaterally. Progression of RICA stenosis now in the >80% range. Stable 94-32% LICA stenosis. Normal subclavian arteries, bilaterally. Patent vertebral arteries with antegrade flow. Please set up OV with Dr. Gwenlyn Found ASAP

## 2014-07-05 ENCOUNTER — Other Ambulatory Visit: Payer: Self-pay | Admitting: Cardiology

## 2014-07-06 ENCOUNTER — Encounter: Payer: No Typology Code available for payment source | Admitting: Physician Assistant

## 2014-07-08 ENCOUNTER — Encounter: Payer: Self-pay | Admitting: Cardiovascular Disease

## 2014-07-08 ENCOUNTER — Ambulatory Visit (INDEPENDENT_AMBULATORY_CARE_PROVIDER_SITE_OTHER): Payer: Medicare Other | Admitting: Cardiovascular Disease

## 2014-07-08 VITALS — BP 136/62 | HR 62 | Ht 64.0 in | Wt 191.4 lb

## 2014-07-08 DIAGNOSIS — I6523 Occlusion and stenosis of bilateral carotid arteries: Secondary | ICD-10-CM | POA: Diagnosis not present

## 2014-07-08 DIAGNOSIS — I4891 Unspecified atrial fibrillation: Secondary | ICD-10-CM

## 2014-07-08 DIAGNOSIS — Z9889 Other specified postprocedural states: Secondary | ICD-10-CM

## 2014-07-08 NOTE — Patient Instructions (Addendum)
You have been referred to Dr Trula Slade  Your physician recommends that you schedule a follow-up appointment As Needed

## 2014-07-08 NOTE — Progress Notes (Signed)
07/08/2014 [name] [name]   21-Mar-1930  [id number]  Primary Physician MCKEOWN,WILLIAM DAVID, MD Primary Cardiologist: Lorretta Harp MD Renae Gloss   HPI:  [name] is a delightful 81 year old mildly overweight widowed Caucasian female mother of 4 children, grandmother 4 grandchildren who is accompanied by her daughter [name] [name] today .  She is a patient of Dr. Tressia Miners Turner's. She has a history of hypertension, family history of heart disease and paroxysmal A. Fib fibrillation status post transesophageal DC cardioversion by Dr. Harrington Challenger. She is on oral anticoagulation. She had recent carotid Dopplers that showed marked progression of disease on the right side now in the 80+ percent range. She was neurologically asymptomatic.   Current Outpatient Prescriptions  Medication Sig Dispense Refill  . allopurinol (ZYLOPRIM) 300 MG tablet Take 150 mg by mouth at bedtime.    . ALPRAZolam (XANAX) 1 MG tablet Take 1/2 to 1 tablet at hour of Sleep (Patient taking differently: Take 0.5-1 mg by mouth at bedtime as needed for sleep. ) 30 tablet 5  . cholecalciferol (VITAMIN D) 1000 UNITS tablet Take 4,000 Units by mouth every morning.     . diltiazem (CARDIZEM LA) 240 MG 24 hr tablet Take 1 tablet (240 mg total) by mouth daily. 60 tablet 0  . ELIQUIS 2.5 MG TABS tablet TAKE 1 TABLET BY MOUTH TWICE A DAY 60 tablet 5  . Flaxseed, Linseed, (FLAX SEED OIL) 1000 MG CAPS Take 1 capsule by mouth every morning.    . furosemide (LASIX) 40 MG tablet Take 40 mg by mouth daily.  6  . gabapentin (NEURONTIN) 300 MG capsule TAKE ONE CAPSULE BY MOUTH EVERY DAY AT BEDTIME 90 capsule 4  . gemfibrozil (LOPID) 600 MG tablet TAKE 1 TABLET BY MOUTH TWICE DAILY WITH MEALS FOR CHOLESTEROL 180 tablet 0  . Lancets (FREESTYLE) lancets Check blood glucose 1 time daily.  DX-E11.22 100 each 6  . loperamide (IMODIUM A-D) 2 MG tablet Take 2 mg by mouth 4 (four) times daily as needed for diarrhea or loose stools.    . magnesium  oxide (MAG-OX) 400 MG tablet Take 400 mg by mouth daily.    . metoprolol (LOPRESSOR) 50 MG tablet Take 1 tablet (50 mg total) by mouth 2 (two) times daily. 60 tablet 6  . omeprazole (PRILOSEC) 20 MG capsule TAKE ONE CAPSULE EVERY MORNING FOR ACID 90 capsule 1  . ranitidine (ZANTAC) 300 MG tablet Take 1 tablet (300 mg total) by mouth as needed for heartburn. 60 tablet 3  . vitamin B-12 (CYANOCOBALAMIN) 1000 MCG tablet Take 1,000 mcg by mouth every morning.    . vitamin C (ASCORBIC ACID) 500 MG tablet Take 500 mg by mouth 2 (two) times daily.      No current facility-administered medications for this visit.    Allergies  Allergen Reactions  . Codeine Other (See Comments)    "felt like things were crawling on me"  . Lisinopril Other (See Comments)    fatigue  . Tylenol Pm Extra [Diphenhydramine-Apap (Sleep)]     History   Social History  . Marital Status: Widowed    Spouse Name: N/A  . Number of Children: N/A  . Years of Education: N/A   Occupational History  . Not on file.   Social History Main Topics  . Smoking status: Never Smoker   . Smokeless tobacco: Never Used  . Alcohol Use: No  . Drug Use: No  . Sexual Activity: No   Other Topics Concern  .  Not on file   Social History Narrative     Review of Systems: General: negative for chills, fever, night sweats or weight changes.  Cardiovascular: negative for chest pain, dyspnea on exertion, edema, orthopnea, palpitations, paroxysmal nocturnal dyspnea or shortness of breath Dermatological: negative for rash Respiratory: negative for cough or wheezing Urologic: negative for hematuria Abdominal: negative for nausea, vomiting, diarrhea, bright red blood per rectum, melena, or hematemesis Neurologic: negative for visual changes, syncope, or dizziness All other systems reviewed and are otherwise negative except as noted above.    Blood pressure 136/62, pulse 62, height 5\' 4"  (1.626 m), weight 191 lb 6.4 oz (86.818 kg).    General appearance: alert and no distress Neck: no adenopathy, no JVD, supple, symmetrical, trachea midline, thyroid not enlarged, symmetric, no tenderness/mass/nodules and soft bilateral carotid bruits Lungs: clear to auscultation bilaterally Heart: regular rate and rhythm, S1, S2 normal, no murmur, click, rub or gallop Extremities: extremities normal, atraumatic, no cyanosis or edema  EKG MO/62 with poor R-wave progression and left axis deviation (left anterior fascicular block). I personally reviewed this EKG  ASSESSMENT AND PLAN:   Carotid artery stenosis History of mild carotid artery disease by duplex ultrasound one year ago with recent Doppler performed 06/27/14 revealing high-grade right ICA stenosis. The patient is neurologically symptomatically. Her creatinine is in the 170 range and she is 81 years old.. I do not think she is a candidate for carotid stenting. I'm going to refer her to Dr.Brabham  for discussion about endarterectomy versus Crest 2  trial although with rapid progression of disease one wonders about an ulcerated plaque with plaque hemorrhage which would favor a revascularization strategy.       Lorretta Harp MD FACP,FACC,FAHA, [facility] 07/08/2014 2:58 PM

## 2014-07-08 NOTE — Assessment & Plan Note (Signed)
History of mild carotid artery disease by duplex ultrasound one year ago with recent Doppler performed 06/27/14 revealing high-grade right ICA stenosis. The patient is neurologically symptomatically. Her creatinine is in the 170 range and she is 81 years old.. I do not think she is a candidate for carotid stenting. I'm going to refer her to Dr.Brabham  for discussion about endarterectomy versus Crest 2  trial although with rapid progression of disease one wonders about an ulcerated plaque with plaque hemorrhage which would favor a revascularization strategy.

## 2014-07-14 ENCOUNTER — Other Ambulatory Visit: Payer: Self-pay | Admitting: *Deleted

## 2014-07-14 DIAGNOSIS — I6521 Occlusion and stenosis of right carotid artery: Secondary | ICD-10-CM

## 2014-07-14 NOTE — Progress Notes (Signed)
Cardiology Office Note   Date:  07/15/2014   ID:  [name] [name], DOB Oct 04, 1930, MRN [id number]  PCP:  Alesia Richards, MD  Cardiologist:  Dr. Fransico Him     Chief Complaint  Patient presents with  . Atrial Fibrillation  . Congestive Heart Failure     History of Present Illness: [name] [name] is a 81 y.o. female with a hx of AFib, HTN, dyslipidemia, obesity, DM, mild pulmonary HTN, moderate TR and chronic diastolic CHF and moderate left carotid artery stenosis.  She was recently admitted with new onset AFib with RVR.  She had difficult to control HRs and was readmitted 5/12-5/14.  She was sent from the office with HR 120.  She underwent TEE-DCCV with restoration of NSR. Lasix was held at DC due to worsening Creatinine.  She returns for FU.  Of note, she recently saw Dr. Quay Burow for evaluation of worsening carotid stenosis on the R. She is not felt to be a candidate for carotid stenting and has been referred to [facility].    She returns for follow-up. She's doing well. She has more energy. She feels less short of breath. She denies chest or shoulder pain. She denies orthopnea, PND or significant edema. She denies syncope or dizziness.  Studies/Reports Reviewed Today:  Myoview 06/27/14 Normal, EF > 65%  Carotid US 5/91/63 RICA > 84%; LICA 66-59%  Echo 9/35/70 Mod LVH, EF 55-60%, no RWMA Mild MR  TEE 06/17/14 Normal LVF Mild MR No source of embolus   Past Medical History  Diagnosis Date  . Obesity   . Hyperlipidemia   . Type II or unspecified type diabetes mellitus without mention of complication, not stated as uncontrolled   . Allergy   . Arthritis   . Gout   . Vitamin D deficiency   . Carotid artery stenosis     1-39% right and 40-59% left by dopplers 2015  . Chronic diastolic CHF (congestive heart failure), NYHA class 1   . Hypertension   . Atrial fibrillation   . CKD (chronic kidney disease), stage III   . GERD (gastroesophageal reflux disease)      Past Surgical History  Procedure Laterality Date  . Joint replacement    . Dilitation and currage    . Vein surgery    . Cataract extraction    . Tee without cardioversion N/A 06/17/2014    Procedure: TRANSESOPHAGEAL ECHOCARDIOGRAM (TEE);  Surgeon: Fay Records, MD;  Location: [facility] ENDOSCOPY;  Service: Cardiovascular;  Laterality: N/A;  . Cardioversion N/A 06/17/2014    Procedure: CARDIOVERSION;  Surgeon: Fay Records, MD;  Location: [facility] ENDOSCOPY;  Service: Cardiovascular;  Laterality: N/A;     Current Outpatient Prescriptions  Medication Sig Dispense Refill  . allopurinol (ZYLOPRIM) 300 MG tablet Take 150 mg by mouth at bedtime.    . ALPRAZolam (XANAX) 1 MG tablet Take 1/2 to 1 tablet at hour of Sleep (Patient taking differently: Take 0.5-1 mg by mouth at bedtime as needed for sleep. ) 30 tablet 5  . cholecalciferol (VITAMIN D) 1000 UNITS tablet Take 4,000 Units by mouth every morning.     . diltiazem (CARDIZEM LA) 240 MG 24 hr tablet Take 1 tablet (240 mg total) by mouth daily. 60 tablet 0  . ELIQUIS 2.5 MG TABS tablet TAKE 1 TABLET BY MOUTH TWICE A DAY 60 tablet 5  . Flaxseed, Linseed, (FLAX SEED OIL) 1000 MG CAPS Take 1 capsule by mouth every morning.    . furosemide (  LASIX) 40 MG tablet Take 40 mg by mouth daily.  6  . gabapentin (NEURONTIN) 300 MG capsule TAKE ONE CAPSULE BY MOUTH EVERY DAY AT BEDTIME 90 capsule 4  . gemfibrozil (LOPID) 600 MG tablet TAKE 1 TABLET BY MOUTH TWICE DAILY WITH MEALS FOR CHOLESTEROL 180 tablet 0  . Lancets (FREESTYLE) lancets Check blood glucose 1 time daily.  DX-E11.22 100 each 6  . magnesium oxide (MAG-OX) 400 MG tablet Take 400 mg by mouth daily.    . metoprolol (LOPRESSOR) 50 MG tablet Take 1 tablet (50 mg total) by mouth 2 (two) times daily. 60 tablet 6  . omeprazole (PRILOSEC) 20 MG capsule TAKE ONE CAPSULE EVERY MORNING FOR ACID 90 capsule 1  . ranitidine (ZANTAC) 300 MG tablet Take 1 tablet (300 mg total) by mouth as needed for heartburn. 60  tablet 3  . vitamin B-12 (CYANOCOBALAMIN) 1000 MCG tablet Take 1,000 mcg by mouth every morning.    . vitamin C (ASCORBIC ACID) 500 MG tablet Take 500 mg by mouth 2 (two) times daily.      No current facility-administered medications for this visit.    Allergies:   Codeine; Lisinopril; and Tylenol pm extra    Social History:  The patient  reports that she has never smoked. She has never used smokeless tobacco. She reports that she does not drink alcohol or use illicit drugs.   Family History:  The patient's family history includes Asthma in her father and son; Diabetes in her brother; Heart attack in her father; Hyperlipidemia in her father; Hypertension in her father, mother, and son; Sleep apnea in her daughter; Stroke in her mother.    ROS:   Please see the history of present illness.   Review of Systems  Gastrointestinal: Positive for constipation. Negative for hematochezia and melena.  Genitourinary: Negative for hematuria.  All other systems reviewed and are negative.    PHYSICAL EXAM: VS:  BP 140/68 mmHg  Pulse 57  Ht 5\' 4"  (1.626 m)  Wt 189 lb 12.8 oz (86.093 kg)  BMI 32.56 kg/m2    Wt Readings from Last 3 Encounters:  07/15/14 189 lb 12.8 oz (86.093 kg)  07/08/14 191 lb 6.4 oz (86.818 kg)  06/27/14 190 lb (86.183 kg)     GEN: Well nourished, well developed, in no acute distress HEENT: normal Neck: no JVD,  no masses Cardiac:  Normal S1/S2, RRR; no murmur ,  no rubs or gallops, trace to 1+ bilateral LE edema   Respiratory:  clear to auscultation bilaterally, no wheezing, rhonchi or rales. GI: soft, nontender, nondistended, + BS MS: no deformity or atrophy Skin: warm and dry  Neuro:  CNs II-XII intact, Strength and sensation are intact Psych: Normal affect   EKG:  EKG is ordered today.  It demonstrates:   Sinus bradycardia, HR 57, LAD, poor R-wave progression, first-degree AV block (PR 212 ms), QTc 447 ms   Recent Labs: 06/02/2014: B Natriuretic Peptide  520.6*; TSH 2.644 06/08/2014: ALT 12; Magnesium 2.2 06/09/2014: Pro B Natriuretic peptide (BNP) 233.0* 06/16/2014: Hemoglobin 11.8*; Platelets 204 07/15/2014: BUN 46*; Creatinine, Ser 1.44*; Potassium 4.3; Sodium 137    Lipid Panel    Component Value Date/Time   CHOL 136 06/20/2014 0949   TRIG 80.0 06/20/2014 0949   HDL 56.50 06/20/2014 0949   CHOLHDL 2 06/20/2014 0949   VLDL 16.0 06/20/2014 0949   LDLCALC 64 06/20/2014 0949   LDLDIRECT 100.8 02/14/2014 0930      ASSESSMENT AND PLAN:  Persistent  atrial fibrillation:  Maintaining NSR. Continue Eliquis, Cardizem, metoprolol.  Chronic diastolic CHF (congestive heart failure), NYHA class 1:  Volume appears stable. Continue current dose of Lasix.  Essential hypertension:  Borderline control. She was taken off of losartan with the addition of rate controlling medications. Her blood pressure would probably tolerate losartan 25 mg daily. She is being considered for carotid artery surgery soon. Over time, especially in light of her chronic kidney disease, we should consider resuming low-dose losartan at some point. Check BMET today.  Mixed hyperlipidemia:  Recent lipid panel optimal.  Carotid artery stenosis, bilateral:  Follow-up with vascular surgery is planned.  CKD stage 3 due to type 2 diabetes mellitus:  Obtain BMET today.   Current medicines are reviewed at length with the patient today.  Concerns regarding medicines are as outlined above.  The following changes have been made:    None   Labs/ tests ordered today include:  Orders Placed This Encounter  Procedures  . Basic Metabolic Panel (BMET)  . EKG 12-Lead    Disposition:   FU with Dr. Radford Pax 3 months   Signed, Richardson Dopp, PA-C, [facility] 07/15/2014 1:20 PM    [facility] [place], [place], [place]  [address] Phone: [phone]; Fax: [phone]

## 2014-07-15 ENCOUNTER — Other Ambulatory Visit: Payer: Self-pay | Admitting: Internal Medicine

## 2014-07-15 ENCOUNTER — Ambulatory Visit (INDEPENDENT_AMBULATORY_CARE_PROVIDER_SITE_OTHER): Payer: Medicare Other | Admitting: Physician Assistant

## 2014-07-15 ENCOUNTER — Encounter: Payer: Self-pay | Admitting: Physician Assistant

## 2014-07-15 ENCOUNTER — Telehealth: Payer: Self-pay | Admitting: *Deleted

## 2014-07-15 VITALS — BP 140/68 | HR 57 | Ht 64.0 in | Wt 189.8 lb

## 2014-07-15 DIAGNOSIS — I4819 Other persistent atrial fibrillation: Secondary | ICD-10-CM

## 2014-07-15 DIAGNOSIS — I6521 Occlusion and stenosis of right carotid artery: Secondary | ICD-10-CM | POA: Diagnosis not present

## 2014-07-15 DIAGNOSIS — I5032 Chronic diastolic (congestive) heart failure: Secondary | ICD-10-CM | POA: Diagnosis not present

## 2014-07-15 DIAGNOSIS — I10 Essential (primary) hypertension: Secondary | ICD-10-CM | POA: Diagnosis not present

## 2014-07-15 DIAGNOSIS — E1122 Type 2 diabetes mellitus with diabetic chronic kidney disease: Secondary | ICD-10-CM

## 2014-07-15 DIAGNOSIS — I481 Persistent atrial fibrillation: Secondary | ICD-10-CM | POA: Diagnosis not present

## 2014-07-15 DIAGNOSIS — I6523 Occlusion and stenosis of bilateral carotid arteries: Secondary | ICD-10-CM

## 2014-07-15 DIAGNOSIS — E782 Mixed hyperlipidemia: Secondary | ICD-10-CM | POA: Diagnosis not present

## 2014-07-15 DIAGNOSIS — N183 Chronic kidney disease, stage 3 unspecified: Secondary | ICD-10-CM

## 2014-07-15 LAB — BASIC METABOLIC PANEL
BUN: 46 mg/dL — AB (ref 6–23)
CALCIUM: 10.2 mg/dL (ref 8.4–10.5)
CO2: 30 meq/L (ref 19–32)
Chloride: 98 mEq/L (ref 96–112)
Creatinine, Ser: 1.44 mg/dL — ABNORMAL HIGH (ref 0.40–1.20)
GFR: 37 mL/min — AB (ref >60.00)
Glucose, Bld: 104 mg/dL — ABNORMAL HIGH (ref 70–99)
Potassium: 4.3 mEq/L (ref 3.5–5.1)
SODIUM: 137 meq/L (ref 135–145)

## 2014-07-15 MED ORDER — LOSARTAN POTASSIUM 50 MG PO TABS: 25.0000 mg | ORAL_TABLET | Freq: Every day | ORAL | Status: DC

## 2014-07-15 NOTE — Telephone Encounter (Signed)
DPR on file for daughter [name] [name] who has been notified of lab results and to decrease lasix to 20 mg daily and to start losartan 25 mg daily. Pam states pt has losartan 50 is it ok to take 1/2 tab. I said yes that is fine. BMET 6/17. Advised monitor weight. Call if weight is up 3 lb's x 1 day or increased sob, edema.

## 2014-07-15 NOTE — Patient Instructions (Signed)
Medication Instructions:  Your physician recommends that you continue on your current medications as directed. Please refer to the Current Medication list given to you today.   Labwork: 1. TODAY BMET  Testing/Procedures: NONE  Follow-Up: DR. Radford Pax 10/24/14 @ 9:15  Any Other Special Instructions Will Be Listed Below (If Applicable).

## 2014-07-16 ENCOUNTER — Other Ambulatory Visit: Payer: Self-pay | Admitting: Cardiology

## 2014-07-16 ENCOUNTER — Other Ambulatory Visit: Payer: Self-pay | Admitting: Internal Medicine

## 2014-07-18 ENCOUNTER — Telehealth: Payer: Self-pay | Admitting: Physician Assistant

## 2014-07-18 DIAGNOSIS — I5032 Chronic diastolic (congestive) heart failure: Secondary | ICD-10-CM

## 2014-07-18 DIAGNOSIS — E1122 Type 2 diabetes mellitus with diabetic chronic kidney disease: Secondary | ICD-10-CM

## 2014-07-18 DIAGNOSIS — N183 Chronic kidney disease, stage 3 (moderate): Secondary | ICD-10-CM

## 2014-07-18 MED ORDER — LOSARTAN POTASSIUM 25 MG PO TABS: 25.0000 mg | ORAL_TABLET | Freq: Every day | ORAL | Status: DC

## 2014-07-18 NOTE — Telephone Encounter (Signed)
Pt notified new dose of losaratan sent in today for the 25 mg tablet. pt states she was having a hard time cutting the 50 mg.

## 2014-07-18 NOTE — Telephone Encounter (Signed)
New message       Need new presc of 25mg  losartan called in to [facility].  Pt saw Scott last week and was put on this medication.

## 2014-07-19 ENCOUNTER — Telehealth: Payer: Self-pay

## 2014-07-19 NOTE — Telephone Encounter (Signed)
Request from [facility] yesterday for Losartan refill. It was inadvertently sent to the pharmacy, but was discontinued per Dr. Radford Pax on 06/09/2014. I called [facility] to let them know this.

## 2014-07-22 ENCOUNTER — Other Ambulatory Visit (INDEPENDENT_AMBULATORY_CARE_PROVIDER_SITE_OTHER): Payer: Medicare Other | Admitting: *Deleted

## 2014-07-22 DIAGNOSIS — I5032 Chronic diastolic (congestive) heart failure: Secondary | ICD-10-CM | POA: Diagnosis not present

## 2014-07-22 DIAGNOSIS — E1122 Type 2 diabetes mellitus with diabetic chronic kidney disease: Secondary | ICD-10-CM | POA: Diagnosis not present

## 2014-07-22 DIAGNOSIS — N183 Chronic kidney disease, stage 3 (moderate): Secondary | ICD-10-CM | POA: Diagnosis not present

## 2014-07-22 LAB — BASIC METABOLIC PANEL
BUN: 31 mg/dL — ABNORMAL HIGH (ref 6–23)
CHLORIDE: 100 meq/L (ref 96–112)
CO2: 27 mEq/L (ref 19–32)
CREATININE: 1.25 mg/dL — AB (ref 0.40–1.20)
Calcium: 10.2 mg/dL (ref 8.4–10.5)
GFR: 43.56 mL/min — AB (ref >60.00)
Glucose, Bld: 100 mg/dL — ABNORMAL HIGH (ref 70–99)
Potassium: 4.3 mEq/L (ref 3.5–5.1)
Sodium: 134 mEq/L — ABNORMAL LOW (ref 135–145)

## 2014-07-26 ENCOUNTER — Telehealth: Payer: Self-pay | Admitting: *Deleted

## 2014-07-26 NOTE — Telephone Encounter (Signed)
DPR ok to s/w daughter Olin Hauser; daughter Olin Hauser aware of lab results with verbal understanding by phone today to plan of care

## 2014-07-27 ENCOUNTER — Encounter: Payer: Self-pay | Admitting: Surgery

## 2014-08-01 ENCOUNTER — Other Ambulatory Visit: Payer: Self-pay | Admitting: Cardiology

## 2014-08-01 ENCOUNTER — Ambulatory Visit (HOSPITAL_COMMUNITY)
Admission: RE | Admit: 2014-08-01 | Discharge: 2014-08-01 | Disposition: A | Discharge status: Home / Self Care | Payer: Medicare Other | Source: Ambulatory Visit | Attending: Surgery | Admitting: Surgery

## 2014-08-01 ENCOUNTER — Encounter: Payer: Self-pay | Admitting: Surgery

## 2014-08-01 ENCOUNTER — Other Ambulatory Visit: Payer: Self-pay

## 2014-08-01 ENCOUNTER — Ambulatory Visit (INDEPENDENT_AMBULATORY_CARE_PROVIDER_SITE_OTHER): Payer: Medicare Other | Admitting: Surgery

## 2014-08-01 VITALS — BP 117/80 | HR 62 | Temp 97.7°F | Resp 16 | Ht 64.0 in | Wt 188.0 lb

## 2014-08-01 DIAGNOSIS — I6521 Occlusion and stenosis of right carotid artery: Secondary | ICD-10-CM | POA: Insufficient documentation

## 2014-08-01 MED ORDER — DILTIAZEM HCL ER COATED BEADS 240 MG PO TB24: 240.0000 mg | ORAL_TABLET | Freq: Every day | ORAL | Status: DC

## 2014-08-01 NOTE — Progress Notes (Signed)
Patient name: [name] [name] MRN: [id number] DOB: 03-23-1930 Sex: female   Referred by: Avelina Laine  Reason for referral:  Chief Complaint  Patient presents with  . Carotid    REF- Dr. Gwenlyn Found     Rt carotid stenosis    He suffers from hypercholesterolemia HISTORY OF PRESENT ILLNESS: A pleasant 81 year old female who comes in today for evaluation of right carotid stenosis.  The patient has been followed for asymptomatic stenosis.  One year ago, the right sided velocities were in the 1-39 percent range.  They recently progressed to greater than 65.  The left side remained stable and the 40-59 percent category.  The patient is asymptomatic.  Specifically, she denies numbness or weakness in either extremity.  She denies slurred speech.  She denies amaurosis fugax.  There is a family history of stroke in her mother.  The patient suffers from diabetes.  She also has chronic renal insufficiency she is a nonsmoker.  She has a history of atrial fibrillation, status post cardioversion.  She takes Eliquis.  Past Medical History  Diagnosis Date  . Obesity   . Hyperlipidemia   . Type II or unspecified type diabetes mellitus without mention of complication, not stated as uncontrolled   . Allergy   . Arthritis   . Gout   . Vitamin D deficiency   . Carotid artery stenosis     1-39% right and 40-59% left by dopplers 2015  . Chronic diastolic CHF (congestive heart failure), NYHA class 1   . Hypertension   . Atrial fibrillation   . CKD (chronic kidney disease), stage III   . GERD (gastroesophageal reflux disease)     Past Surgical History  Procedure Laterality Date  . Joint replacement    . Dilitation and currage    . Vein surgery    . Cataract extraction    . Tee without cardioversion N/A 06/17/2014    Procedure: TRANSESOPHAGEAL ECHOCARDIOGRAM (TEE);  Surgeon: Fay Records, MD;  Location: [facility] ENDOSCOPY;  Service: Cardiovascular;  Laterality: N/A;  . Cardioversion N/A 06/17/2014    Procedure:  CARDIOVERSION;  Surgeon: Fay Records, MD;  Location: [facility] ENDOSCOPY;  Service: Cardiovascular;  Laterality: N/A;    History   Social History  . Marital Status: Widowed    Spouse Name: N/A  . Number of Children: N/A  . Years of Education: N/A   Occupational History  . Not on file.   Social History Main Topics  . Smoking status: Never Smoker   . Smokeless tobacco: Never Used  . Alcohol Use: No  . Drug Use: No  . Sexual Activity: No   Other Topics Concern  . Not on file   Social History Narrative    Family History  Problem Relation Age of Onset  . Hypertension Mother   . Stroke Mother   . Asthma Father   . Hypertension Father   . Hyperlipidemia Father   . Heart attack Father   . Diabetes Brother   . Sleep apnea Daughter   . Asthma Son   . Hypertension Son     Allergies as of 08/01/2014 - Review Complete 08/01/2014  Allergen Reaction Noted  . Codeine Other (See Comments) 12/25/2011  . Lisinopril Other (See Comments) 02/02/2013  . Tylenol pm extra [diphenhydramine-apap (sleep)]  05/09/2012    Current Outpatient Prescriptions on File Prior to Visit  Medication Sig Dispense Refill  . allopurinol (ZYLOPRIM) 300 MG tablet Take 150 mg by mouth at bedtime.    Marland Kitchen  ALPRAZolam (XANAX) 1 MG tablet TAKE 1/2 TO 1 TABLET EVERY DAY AT BEDTIME AS NEEDED FOR SLEEP 30 tablet 3  . cholecalciferol (VITAMIN D) 1000 UNITS tablet Take 4,000 Units by mouth every morning.     . diltiazem (CARDIZEM LA) 240 MG 24 hr tablet Take 1 tablet (240 mg total) by mouth daily. 60 tablet 0  . ELIQUIS 2.5 MG TABS tablet TAKE 1 TABLET BY MOUTH TWICE A DAY 60 tablet 5  . Flaxseed, Linseed, (FLAX SEED OIL) 1000 MG CAPS Take 1 capsule by mouth every morning.    . furosemide (LASIX) 40 MG tablet Take 20 mg by mouth daily.  6  . gabapentin (NEURONTIN) 300 MG capsule TAKE ONE CAPSULE BY MOUTH EVERY DAY AT BEDTIME 90 capsule 4  . gemfibrozil (LOPID) 600 MG tablet TAKE 1 TABLET BY MOUTH TWICE DAILY WITH MEALS FOR  CHOLESTEROL 180 tablet 0  . Lancets (FREESTYLE) lancets Check blood glucose 1 time daily.  DX-E11.22 100 each 6  . magnesium oxide (MAG-OX) 400 MG tablet Take 400 mg by mouth daily.    . metoprolol (LOPRESSOR) 50 MG tablet Take 1 tablet (50 mg total) by mouth 2 (two) times daily. 60 tablet 6  . omeprazole (PRILOSEC) 20 MG capsule TAKE ONE CAPSULE EVERY MORNING FOR ACID 90 capsule 1  . ranitidine (ZANTAC) 300 MG tablet Take 1 tablet (300 mg total) by mouth as needed for heartburn. 60 tablet 3  . vitamin B-12 (CYANOCOBALAMIN) 1000 MCG tablet Take 1,000 mcg by mouth every morning.    . vitamin C (ASCORBIC ACID) 500 MG tablet Take 500 mg by mouth 2 (two) times daily.      No current facility-administered medications on file prior to visit.     REVIEW OF SYSTEMS: Cardiovascular: No chest pain, chest pressure, palpitations, orthopnea, No claudication or rest pain, Positive for shortness of breath with exertion.  Positive for leg swelling and varicose veins Pulmonary: No productive cough, asthma or wheezing. Neurologic: No weakness, paresthesias, aphasia, or amaurosis. No dizziness. Hematologic: No bleeding problems or clotting disorders. Musculoskeletal: No joint pain or joint swelling. Gastrointestinal: No blood in stool or hematemesis Genitourinary: No dysuria or hematuria. Psychiatric:: No history of major depression. Integumentary: No rashes or ulcers. Constitutional: No fever or chills.  PHYSICAL EXAMINATION:  Filed Vitals:   08/01/14 1301 08/01/14 1305  BP: 136/78 117/80  Pulse: 60 62  Temp: 97.7 F (36.5 C)   TempSrc: Oral   Resp: 16   Height: 5\' 4"  (1.626 m)   Weight: 188 lb (85.276 kg)   SpO2: 97%    Body mass index is 32.25 kg/(m^2). General: The patient appears their stated age.   HEENT:  No gross abnormalities Pulmonary: Respirations are non-labored Abdomen: Soft and non-tender  Musculoskeletal: There are no major deformities.   Neurologic: No focal weakness or  paresthesias are detected, Skin: There are no ulcer or rashes noted. Psychiatric: The patient has normal affect. Cardiovascular: There is a regular rate and rhythm without significant murmur appreciated.  No carotid bruits  Diagnostic Studies: Carotid Dopplers were reviewed.  The limited study in our office shows greater than 80% right-sided stenosis with a very high carotid bifurcation.  Outside studies showed greater than 80% right carotid stenosis and 40-59 percent left side a stenosis   Assessment:  Asymptomatic right carotid stenosis Plan: Ultrasound indicates a high bifurcation.  I think this needs to be further evaluated before scheduling her for surgery.  Because of her renal insufficiency, she is not  a candidate for a CT angiogram.  Therefore, I think she needs to undergo diagnostic carotid angiography.  I will stop her blood thinners 3 days before her procedure which is been scheduled for Wednesday, July 13.  Assuming that she is an operative candidate, I have scheduled her right carotid endarterectomy for Thursday, July 14.  She will be admitted overnight for procedure.  I discussed the risks and benefits of carotid endarterectomy which include the risk of stroke and nerve injury.  All of her questions were answered.  She wishes to proceed.     Eldridge Abrahams, M.D. [facility] [place] Office: [phone] Pager:  [phone]

## 2014-08-03 NOTE — Telephone Encounter (Signed)
Medication was given to patient by an internal medicine physician. Ok to refill under Dr Radford Pax? Please advise. Thanks, MI

## 2014-08-13 DIAGNOSIS — H60339 Swimmer's ear, unspecified ear: Secondary | ICD-10-CM | POA: Diagnosis not present

## 2014-08-15 ENCOUNTER — Telehealth: Payer: Self-pay

## 2014-08-15 NOTE — Telephone Encounter (Signed)
Phone call from pt.  Reported she went to Urgent Care on 08/13/14 due to right earache.  Reported she was given Neomycin Polymyxin gtts to instill in right ear qid.  Denied fever / chills.  Stated she is feeling better.  Will make Dr. Trula Slade aware of treatment for earache, since she is scheduled for carotid angiogram and right CEA this week.

## 2014-08-15 NOTE — Telephone Encounter (Signed)
Dr. Trula Slade was made aware of pt. being treated for earache, and stated she feels better.  No new orders/ recommendations at this time.

## 2014-08-16 MED ORDER — SODIUM CHLORIDE 0.9 % IV SOLN
INTRAVENOUS | Status: DC
  Administered 2014-08-17: 09:00:00 via INTRAVENOUS

## 2014-08-17 ENCOUNTER — Encounter (HOSPITAL_COMMUNITY)
Admission: RE | Disposition: A | Discharge status: Home / Self Care | Payer: Self-pay | Source: Ambulatory Visit | Attending: Surgery

## 2014-08-17 ENCOUNTER — Inpatient Hospital Stay (HOSPITAL_COMMUNITY)
Admission: RE | Admit: 2014-08-17 | Discharge: 2014-08-19 | DRG: 038 | Disposition: A | Discharge status: Home / Self Care | Payer: Medicare Other | Source: Ambulatory Visit | Attending: Surgery | Admitting: Surgery

## 2014-08-17 ENCOUNTER — Other Ambulatory Visit (HOSPITAL_COMMUNITY): Payer: Medicare Other

## 2014-08-17 ENCOUNTER — Encounter (HOSPITAL_COMMUNITY): Payer: Self-pay | Admitting: Surgery

## 2014-08-17 DIAGNOSIS — I6521 Occlusion and stenosis of right carotid artery: Secondary | ICD-10-CM | POA: Diagnosis not present

## 2014-08-17 DIAGNOSIS — I6529 Occlusion and stenosis of unspecified carotid artery: Secondary | ICD-10-CM | POA: Diagnosis present

## 2014-08-17 DIAGNOSIS — Z8249 Family history of ischemic heart disease and other diseases of the circulatory system: Secondary | ICD-10-CM

## 2014-08-17 DIAGNOSIS — F419 Anxiety disorder, unspecified: Secondary | ICD-10-CM | POA: Diagnosis present

## 2014-08-17 DIAGNOSIS — M109 Gout, unspecified: Secondary | ICD-10-CM | POA: Diagnosis present

## 2014-08-17 DIAGNOSIS — I4891 Unspecified atrial fibrillation: Secondary | ICD-10-CM | POA: Diagnosis present

## 2014-08-17 DIAGNOSIS — Z885 Allergy status to narcotic agent status: Secondary | ICD-10-CM

## 2014-08-17 DIAGNOSIS — I739 Peripheral vascular disease, unspecified: Secondary | ICD-10-CM | POA: Diagnosis present

## 2014-08-17 DIAGNOSIS — Z6832 Body mass index (BMI) 32.0-32.9, adult: Secondary | ICD-10-CM | POA: Diagnosis not present

## 2014-08-17 DIAGNOSIS — E669 Obesity, unspecified: Secondary | ICD-10-CM | POA: Diagnosis present

## 2014-08-17 DIAGNOSIS — Z7901 Long term (current) use of anticoagulants: Secondary | ICD-10-CM | POA: Diagnosis not present

## 2014-08-17 DIAGNOSIS — E119 Type 2 diabetes mellitus without complications: Secondary | ICD-10-CM | POA: Diagnosis present

## 2014-08-17 DIAGNOSIS — I5032 Chronic diastolic (congestive) heart failure: Secondary | ICD-10-CM | POA: Diagnosis not present

## 2014-08-17 DIAGNOSIS — E780 Pure hypercholesterolemia: Secondary | ICD-10-CM | POA: Diagnosis present

## 2014-08-17 DIAGNOSIS — N183 Chronic kidney disease, stage 3 (moderate): Secondary | ICD-10-CM | POA: Diagnosis present

## 2014-08-17 DIAGNOSIS — Z823 Family history of stroke: Secondary | ICD-10-CM | POA: Diagnosis not present

## 2014-08-17 DIAGNOSIS — K219 Gastro-esophageal reflux disease without esophagitis: Secondary | ICD-10-CM | POA: Diagnosis present

## 2014-08-17 DIAGNOSIS — Z888 Allergy status to other drugs, medicaments and biological substances status: Secondary | ICD-10-CM

## 2014-08-17 DIAGNOSIS — E559 Vitamin D deficiency, unspecified: Secondary | ICD-10-CM | POA: Diagnosis present

## 2014-08-17 DIAGNOSIS — I129 Hypertensive chronic kidney disease with stage 1 through stage 4 chronic kidney disease, or unspecified chronic kidney disease: Secondary | ICD-10-CM | POA: Diagnosis present

## 2014-08-17 HISTORY — PX: ENDARTERECTOMY: SHX5162

## 2014-08-17 HISTORY — DX: Anxiety disorder, unspecified: F41.9

## 2014-08-17 HISTORY — PX: PERIPHERAL VASCULAR CATHETERIZATION: SHX172C

## 2014-08-17 LAB — TYPE AND SCREEN
ABO/RH(D): O NEG
Antibody Screen: NEGATIVE

## 2014-08-17 LAB — CBC
HEMATOCRIT: 37.4 % (ref 36.0–46.0)
HEMOGLOBIN: 12.3 g/dL (ref 12.0–15.0)
MCH: 30.8 pg (ref 26.0–34.0)
MCHC: 32.9 g/dL (ref 30.0–36.0)
MCV: 93.5 fL (ref 78.0–100.0)
Platelets: 209 10*3/uL (ref 150–400)
RBC: 4 MIL/uL (ref 3.87–5.11)
RDW: 13.6 % (ref 11.5–15.5)
WBC: 5.9 10*3/uL (ref 4.0–10.5)

## 2014-08-17 LAB — POCT I-STAT, CHEM 8
BUN: 64 mg/dL — AB (ref 6–20)
CHLORIDE: 106 mmol/L (ref 101–111)
CREATININE: 1.6 mg/dL — AB (ref 0.44–1.00)
Calcium, Ion: 0.88 mmol/L — ABNORMAL LOW (ref 1.13–1.30)
Glucose, Bld: 116 mg/dL — ABNORMAL HIGH (ref 65–99)
HCT: 41 % (ref 36.0–46.0)
Hemoglobin: 13.9 g/dL (ref 12.0–15.0)
POTASSIUM: 5.6 mmol/L — AB (ref 3.5–5.1)
SODIUM: 133 mmol/L — AB (ref 135–145)
TCO2: 25 mmol/L (ref 0–100)

## 2014-08-17 LAB — COMPREHENSIVE METABOLIC PANEL
ALT: 13 U/L — AB (ref 14–54)
AST: 25 U/L (ref 15–41)
Albumin: 3.6 g/dL (ref 3.5–5.0)
Alkaline Phosphatase: 101 U/L (ref 38–126)
Anion gap: 12 (ref 5–15)
BUN: 45 mg/dL — ABNORMAL HIGH (ref 6–20)
CHLORIDE: 99 mmol/L — AB (ref 101–111)
CO2: 28 mmol/L (ref 22–32)
Calcium: 9.7 mg/dL (ref 8.9–10.3)
Creatinine, Ser: 1.53 mg/dL — ABNORMAL HIGH (ref 0.44–1.00)
GFR calc Af Amer: 35 mL/min — ABNORMAL LOW (ref >60)
GFR calc non Af Amer: 31 mL/min — ABNORMAL LOW (ref >60)
Glucose, Bld: 166 mg/dL — ABNORMAL HIGH (ref 65–99)
Potassium: 3.5 mmol/L (ref 3.5–5.1)
Sodium: 139 mmol/L (ref 135–145)
TOTAL PROTEIN: 6.7 g/dL (ref 6.5–8.1)
Total Bilirubin: 0.4 mg/dL (ref 0.3–1.2)

## 2014-08-17 LAB — PROTIME-INR
INR: 1.11 (ref 0.00–1.49)
Prothrombin Time: 14.5 seconds (ref 11.6–15.2)

## 2014-08-17 LAB — APTT: aPTT: 43 seconds — ABNORMAL HIGH (ref 24–37)

## 2014-08-17 LAB — GLUCOSE, CAPILLARY: GLUCOSE-CAPILLARY: 115 mg/dL — AB (ref 65–99)

## 2014-08-17 SURGERY — CAROTID ANGIOGRAPHY

## 2014-08-17 MED ORDER — METOPROLOL TARTRATE 1 MG/ML IV SOLN: 2.0000 mg | INTRAVENOUS | Status: DC | PRN

## 2014-08-17 MED ORDER — GUAIFENESIN-DM 100-10 MG/5ML PO SYRP: 15.0000 mL | ORAL_SOLUTION | ORAL | Status: DC | PRN

## 2014-08-17 MED ORDER — ALUM & MAG HYDROXIDE-SIMETH 200-200-20 MG/5ML PO SUSP: 15.0000 mL | ORAL | Status: DC | PRN

## 2014-08-17 MED ORDER — SODIUM CHLORIDE 0.9 % IV SOLN: INTRAVENOUS | Status: DC

## 2014-08-17 MED ORDER — SODIUM CHLORIDE 0.9 % IV SOLN
INTRAVENOUS | Status: DC
  Administered 2014-08-18 (×2): via INTRAVENOUS

## 2014-08-17 MED ORDER — DOCUSATE SODIUM 100 MG PO CAPS
100.0000 mg | ORAL_CAPSULE | Freq: Every day | ORAL | Status: DC
  Administered 2014-08-19: 100 mg via ORAL
  Filled 2014-08-17 (×2): qty 1

## 2014-08-17 MED ORDER — CHLORHEXIDINE GLUCONATE CLOTH 2 % EX PADS: 6.0000 | MEDICATED_PAD | Freq: Once | CUTANEOUS | Status: DC

## 2014-08-17 MED ORDER — ONDANSETRON HCL 4 MG/2ML IJ SOLN
4.0000 mg | Freq: Four times a day (QID) | INTRAMUSCULAR | Status: DC | PRN
  Administered 2014-08-18: 4 mg via INTRAVENOUS

## 2014-08-17 MED ORDER — HYDRALAZINE HCL 20 MG/ML IJ SOLN: 5.0000 mg | INTRAMUSCULAR | Status: DC | PRN

## 2014-08-17 MED ORDER — LIDOCAINE HCL (PF) 1 % IJ SOLN
INTRAMUSCULAR | Status: DC | PRN
  Administered 2014-08-17: 10 mL

## 2014-08-17 MED ORDER — LABETALOL HCL 5 MG/ML IV SOLN
10.0000 mg | INTRAVENOUS | Status: DC | PRN
  Filled 2014-08-17: qty 4

## 2014-08-17 MED ORDER — PHENOL 1.4 % MT LIQD
1.0000 | OROMUCOSAL | Status: DC | PRN
  Filled 2014-08-17: qty 177

## 2014-08-17 MED ORDER — DEXTROSE 5 % IV SOLN: 1.5000 g | INTRAVENOUS | Status: DC

## 2014-08-17 SURGICAL SUPPLY — 10 items
CATH ANGIO 5F PIGTAIL 100CM (CATHETERS) ×3 IMPLANT
COVER PRB 48X5XTLSCP FOLD TPE (BAG) ×1 IMPLANT
COVER PROBE 5X48 (BAG) ×2
KIT MICROINTRODUCER STIFF 5F (SHEATH) ×3 IMPLANT
KIT PV (KITS) ×3 IMPLANT
SHEATH PINNACLE 5F 10CM (SHEATH) ×3 IMPLANT
SYR MEDRAD MARK V 150ML (SYRINGE) IMPLANT
TRANSDUCER W/STOPCOCK (MISCELLANEOUS) ×3 IMPLANT
TRAY PV CATH (CUSTOM PROCEDURE TRAY) ×3 IMPLANT
WIRE BENTSON .035X145CM (WIRE) ×3 IMPLANT

## 2014-08-17 NOTE — Progress Notes (Signed)
Site area: RFA Site Prior to Removal:  Level 0 Pressure Applied For:15 mins Manual:  yes  Patient Status During Pull:  stable Post Pull Site:  Level 0 Post Pull Instructions Given:  yes Post Pull Pulses Present: palpable Dressing Applied:clear   Bedrest begins @ 1210p Comments:removed by Suella Broad

## 2014-08-17 NOTE — Interval H&P Note (Signed)
History and Physical Interval Note:  08/17/2014 10:53 AM  [name] [name]  has presented today for surgery, with the diagnosis of carotid stenosis  The various methods of treatment have been discussed with the patient and family. After consideration of risks, benefits and other options for treatment, the patient has consented to  Procedure(s): Carotid Angiography (N/A) as a surgical intervention .  The patient's history has been reviewed, patient examined, no change in status, stable for surgery.  I have reviewed the patient's chart and labs.  Questions were answered to the patient's satisfaction.     Annamarie Major

## 2014-08-17 NOTE — H&P (View-Only) (Signed)
Patient name: [name] [name] MRN: [id number] DOB: October 27, 1930 Sex: female   Referred by: Avelina Laine  Reason for referral:  Chief Complaint  Patient presents with  . Carotid    REF- Dr. Gwenlyn Found     Rt carotid stenosis    He suffers from hypercholesterolemia HISTORY OF PRESENT ILLNESS: A pleasant 81 year old female who comes in today for evaluation of right carotid stenosis.  The patient has been followed for asymptomatic stenosis.  One year ago, the right sided velocities were in the 1-39 percent range.  They recently progressed to greater than 21.  The left side remained stable and the 40-59 percent category.  The patient is asymptomatic.  Specifically, she denies numbness or weakness in either extremity.  She denies slurred speech.  She denies amaurosis fugax.  There is a family history of stroke in her mother.  The patient suffers from diabetes.  She also has chronic renal insufficiency she is a nonsmoker.  She has a history of atrial fibrillation, status post cardioversion.  She takes Eliquis.  Past Medical History  Diagnosis Date  . Obesity   . Hyperlipidemia   . Type II or unspecified type diabetes mellitus without mention of complication, not stated as uncontrolled   . Allergy   . Arthritis   . Gout   . Vitamin D deficiency   . Carotid artery stenosis     1-39% right and 40-59% left by dopplers 2015  . Chronic diastolic CHF (congestive heart failure), NYHA class 1   . Hypertension   . Atrial fibrillation   . CKD (chronic kidney disease), stage III   . GERD (gastroesophageal reflux disease)     Past Surgical History  Procedure Laterality Date  . Joint replacement    . Dilitation and currage    . Vein surgery    . Cataract extraction    . Tee without cardioversion N/A 06/17/2014    Procedure: TRANSESOPHAGEAL ECHOCARDIOGRAM (TEE);  Surgeon: Fay Records, MD;  Location: [facility] ENDOSCOPY;  Service: Cardiovascular;  Laterality: N/A;  . Cardioversion N/A 06/17/2014    Procedure:  CARDIOVERSION;  Surgeon: Fay Records, MD;  Location: [facility] ENDOSCOPY;  Service: Cardiovascular;  Laterality: N/A;    History   Social History  . Marital Status: Widowed    Spouse Name: N/A  . Number of Children: N/A  . Years of Education: N/A   Occupational History  . Not on file.   Social History Main Topics  . Smoking status: Never Smoker   . Smokeless tobacco: Never Used  . Alcohol Use: No  . Drug Use: No  . Sexual Activity: No   Other Topics Concern  . Not on file   Social History Narrative    Family History  Problem Relation Age of Onset  . Hypertension Mother   . Stroke Mother   . Asthma Father   . Hypertension Father   . Hyperlipidemia Father   . Heart attack Father   . Diabetes Brother   . Sleep apnea Daughter   . Asthma Son   . Hypertension Son     Allergies as of 08/01/2014 - Review Complete 08/01/2014  Allergen Reaction Noted  . Codeine Other (See Comments) 12/25/2011  . Lisinopril Other (See Comments) 02/02/2013  . Tylenol pm extra [diphenhydramine-apap (sleep)]  05/09/2012    Current Outpatient Prescriptions on File Prior to Visit  Medication Sig Dispense Refill  . allopurinol (ZYLOPRIM) 300 MG tablet Take 150 mg by mouth at bedtime.    Marland Kitchen  ALPRAZolam (XANAX) 1 MG tablet TAKE 1/2 TO 1 TABLET EVERY DAY AT BEDTIME AS NEEDED FOR SLEEP 30 tablet 3  . cholecalciferol (VITAMIN D) 1000 UNITS tablet Take 4,000 Units by mouth every morning.     . diltiazem (CARDIZEM LA) 240 MG 24 hr tablet Take 1 tablet (240 mg total) by mouth daily. 60 tablet 0  . ELIQUIS 2.5 MG TABS tablet TAKE 1 TABLET BY MOUTH TWICE A DAY 60 tablet 5  . Flaxseed, Linseed, (FLAX SEED OIL) 1000 MG CAPS Take 1 capsule by mouth every morning.    . furosemide (LASIX) 40 MG tablet Take 20 mg by mouth daily.  6  . gabapentin (NEURONTIN) 300 MG capsule TAKE ONE CAPSULE BY MOUTH EVERY DAY AT BEDTIME 90 capsule 4  . gemfibrozil (LOPID) 600 MG tablet TAKE 1 TABLET BY MOUTH TWICE DAILY WITH MEALS FOR  CHOLESTEROL 180 tablet 0  . Lancets (FREESTYLE) lancets Check blood glucose 1 time daily.  DX-E11.22 100 each 6  . magnesium oxide (MAG-OX) 400 MG tablet Take 400 mg by mouth daily.    . metoprolol (LOPRESSOR) 50 MG tablet Take 1 tablet (50 mg total) by mouth 2 (two) times daily. 60 tablet 6  . omeprazole (PRILOSEC) 20 MG capsule TAKE ONE CAPSULE EVERY MORNING FOR ACID 90 capsule 1  . ranitidine (ZANTAC) 300 MG tablet Take 1 tablet (300 mg total) by mouth as needed for heartburn. 60 tablet 3  . vitamin B-12 (CYANOCOBALAMIN) 1000 MCG tablet Take 1,000 mcg by mouth every morning.    . vitamin C (ASCORBIC ACID) 500 MG tablet Take 500 mg by mouth 2 (two) times daily.      No current facility-administered medications on file prior to visit.     REVIEW OF SYSTEMS: Cardiovascular: No chest pain, chest pressure, palpitations, orthopnea, No claudication or rest pain, Positive for shortness of breath with exertion.  Positive for leg swelling and varicose veins Pulmonary: No productive cough, asthma or wheezing. Neurologic: No weakness, paresthesias, aphasia, or amaurosis. No dizziness. Hematologic: No bleeding problems or clotting disorders. Musculoskeletal: No joint pain or joint swelling. Gastrointestinal: No blood in stool or hematemesis Genitourinary: No dysuria or hematuria. Psychiatric:: No history of major depression. Integumentary: No rashes or ulcers. Constitutional: No fever or chills.  PHYSICAL EXAMINATION:  Filed Vitals:   08/01/14 1301 08/01/14 1305  BP: 136/78 117/80  Pulse: 60 62  Temp: 97.7 F (36.5 C)   TempSrc: Oral   Resp: 16   Height: 5\' 4"  (1.626 m)   Weight: 188 lb (85.276 kg)   SpO2: 97%    Body mass index is 32.25 kg/(m^2). General: The patient appears their stated age.   HEENT:  No gross abnormalities Pulmonary: Respirations are non-labored Abdomen: Soft and non-tender  Musculoskeletal: There are no major deformities.   Neurologic: No focal weakness or  paresthesias are detected, Skin: There are no ulcer or rashes noted. Psychiatric: The patient has normal affect. Cardiovascular: There is a regular rate and rhythm without significant murmur appreciated.  No carotid bruits  Diagnostic Studies: Carotid Dopplers were reviewed.  The limited study in our office shows greater than 80% right-sided stenosis with a very high carotid bifurcation.  Outside studies showed greater than 80% right carotid stenosis and 40-59 percent left side a stenosis   Assessment:  Asymptomatic right carotid stenosis Plan: Ultrasound indicates a high bifurcation.  I think this needs to be further evaluated before scheduling her for surgery.  Because of her renal insufficiency, she is not  a candidate for a CT angiogram.  Therefore, I think she needs to undergo diagnostic carotid angiography.  I will stop her blood thinners 3 days before her procedure which is been scheduled for Wednesday, July 13.  Assuming that she is an operative candidate, I have scheduled her right carotid endarterectomy for Thursday, July 14.  She will be admitted overnight for procedure.  I discussed the risks and benefits of carotid endarterectomy which include the risk of stroke and nerve injury.  All of her questions were answered.  She wishes to proceed.     Eldridge Abrahams, M.D. [facility] [place] Office: [phone] Pager:  [phone]

## 2014-08-17 NOTE — Op Note (Signed)
    Patient name: [name] [name] MRN: [id number] DOB: 03-02-1930 Sex: female  08/17/2014 Pre-operative Diagnosis: Asymptomatic right carotid stenosis Post-operative diagnosis:  Same Surgeon:  Annamarie Major Procedure Performed:  1.  Ultrasound-guided access, right femoral artery  2.  Second order catheterization  3.  Right carotid angiogram   Indications:  The patient was found to have significant increase in her carotid Doppler velocities over the past year.  They're now greater than 80%.  She has a high bifurcation and is here for further evaluation see if she is a surgical candidate  Procedure:  The patient was identified in the holding area and taken to room 8.  The patient was then placed supine on the table and prepped and draped in the usual sterile fashion.  A time out was called.  Ultrasound was used to evaluate the right common femoral artery.  It was patent .  A digital ultrasound image was acquired.  A micropuncture needle was used to access the right common femoral artery under ultrasound guidance.  An 018 wire was advanced without resistance and a micropuncture sheath was placed.  The 018 wire was removed and a benson wire was placed.  The micropuncture sheath was exchanged for a 5 french sheath.  I then used a Berenstein 2 catheter to select the innominate and then the right common carotid artery.  A carotid injury him with intracranial images were acquired.  The intracranial images will be separately interpreted by interventional radiology   Findings:   Right carotid:  No significant stenosis is identified in the right common carotid artery with the external carotid artery.  Approximately 2 cm beyond the bifurcation there is an 80% stenosis.  There appears to be fibromuscular disease in the distal internal carotid artery.   Intervention:  None  Impression:  #1  80% right carotid stenosis  #2  high bifurcation but should be surgically accessible    V. Annamarie Major,  M.D. [facility] [place] Office: [phone] Pager:  [phone]

## 2014-08-18 ENCOUNTER — Inpatient Hospital Stay (HOSPITAL_COMMUNITY): Payer: Medicare Other | Admitting: Anesthesiology

## 2014-08-18 ENCOUNTER — Encounter (HOSPITAL_COMMUNITY): Payer: Self-pay | Admitting: Anesthesiology

## 2014-08-18 ENCOUNTER — Inpatient Hospital Stay (HOSPITAL_COMMUNITY): Admission: RE | Admit: 2014-08-18 | Payer: Medicare Other | Source: Ambulatory Visit | Admitting: Surgery

## 2014-08-18 ENCOUNTER — Encounter (HOSPITAL_COMMUNITY)
Admission: RE | Disposition: A | Discharge status: Home / Self Care | Payer: Self-pay | Source: Ambulatory Visit | Attending: Surgery

## 2014-08-18 DIAGNOSIS — I6521 Occlusion and stenosis of right carotid artery: Secondary | ICD-10-CM

## 2014-08-18 HISTORY — PX: ENDARTERECTOMY: SHX5162

## 2014-08-18 HISTORY — PX: PATCH ANGIOPLASTY: SHX6230

## 2014-08-18 LAB — URINALYSIS, ROUTINE W REFLEX MICROSCOPIC
Bilirubin Urine: NEGATIVE
Glucose, UA: NEGATIVE mg/dL
HGB URINE DIPSTICK: NEGATIVE
Ketones, ur: NEGATIVE mg/dL
NITRITE: NEGATIVE
Protein, ur: NEGATIVE mg/dL
Specific Gravity, Urine: 1.017 (ref 1.005–1.030)
Urobilinogen, UA: 0.2 mg/dL (ref 0.0–1.0)
pH: 6.5 (ref 5.0–8.0)

## 2014-08-18 LAB — URINE MICROSCOPIC-ADD ON

## 2014-08-18 LAB — GLUCOSE, CAPILLARY
GLUCOSE-CAPILLARY: 141 mg/dL — AB (ref 65–99)
Glucose-Capillary: 124 mg/dL — ABNORMAL HIGH (ref 65–99)
Glucose-Capillary: 151 mg/dL — ABNORMAL HIGH (ref 65–99)

## 2014-08-18 LAB — SURGICAL PCR SCREEN
MRSA, PCR: NEGATIVE
Staphylococcus aureus: NEGATIVE

## 2014-08-18 SURGERY — ENDARTERECTOMY, CAROTID
Anesthesia: General | Site: Neck | Laterality: Right

## 2014-08-18 MED ORDER — LACTATED RINGERS IV SOLN
INTRAVENOUS | Status: DC | PRN
  Administered 2014-08-18: 07:00:00 via INTRAVENOUS

## 2014-08-18 MED ORDER — METOPROLOL TARTRATE 1 MG/ML IV SOLN
2.0000 mg | INTRAVENOUS | Status: DC | PRN
Start: 2014-08-18 — End: 2014-08-19

## 2014-08-18 MED ORDER — LIDOCAINE HCL (CARDIAC) 20 MG/ML IV SOLN
INTRAVENOUS | Status: DC | PRN
  Administered 2014-08-18: 60 mg via INTRAVENOUS

## 2014-08-18 MED ORDER — LIDOCAINE HCL (PF) 1 % IJ SOLN
INTRAMUSCULAR | Status: AC
  Filled 2014-08-18: qty 30

## 2014-08-18 MED ORDER — ALPRAZOLAM 0.5 MG PO TABS: 0.5000 mg | ORAL_TABLET | Freq: Every evening | ORAL | Status: DC | PRN

## 2014-08-18 MED ORDER — ONDANSETRON HCL 4 MG/2ML IJ SOLN: 4.0000 mg | Freq: Four times a day (QID) | INTRAMUSCULAR | Status: DC | PRN

## 2014-08-18 MED ORDER — PROPOFOL 10 MG/ML IV BOLUS
INTRAVENOUS | Status: DC | PRN
  Administered 2014-08-18: 150 mg via INTRAVENOUS

## 2014-08-18 MED ORDER — METOPROLOL TARTRATE 50 MG PO TABS
50.0000 mg | ORAL_TABLET | Freq: Two times a day (BID) | ORAL | Status: DC
  Administered 2014-08-18 – 2014-08-19 (×3): 50 mg via ORAL
  Filled 2014-08-18 (×4): qty 1

## 2014-08-18 MED ORDER — SODIUM CHLORIDE 0.9 % IV SOLN: 500.0000 mL | Freq: Once | INTRAVENOUS | Status: AC | PRN

## 2014-08-18 MED ORDER — PROPOFOL 10 MG/ML IV BOLUS
INTRAVENOUS | Status: AC
  Filled 2014-08-18: qty 20

## 2014-08-18 MED ORDER — DILTIAZEM HCL ER COATED BEADS 240 MG PO TB24
240.0000 mg | ORAL_TABLET | Freq: Every day | ORAL | Status: DC
  Administered 2014-08-18 – 2014-08-19 (×2): 240 mg via ORAL
  Filled 2014-08-18 (×2): qty 1

## 2014-08-18 MED ORDER — MORPHINE SULFATE 2 MG/ML IJ SOLN
2.0000 mg | INTRAMUSCULAR | Status: DC | PRN
  Administered 2014-08-19: 2 mg via INTRAVENOUS
  Filled 2014-08-18: qty 1

## 2014-08-18 MED ORDER — ALUM & MAG HYDROXIDE-SIMETH 200-200-20 MG/5ML PO SUSP: 15.0000 mL | ORAL | Status: DC | PRN

## 2014-08-18 MED ORDER — LABETALOL HCL 5 MG/ML IV SOLN
INTRAVENOUS | Status: AC
  Filled 2014-08-18: qty 4

## 2014-08-18 MED ORDER — GEMFIBROZIL 600 MG PO TABS
600.0000 mg | ORAL_TABLET | Freq: Two times a day (BID) | ORAL | Status: DC
  Administered 2014-08-18 – 2014-08-19 (×2): 600 mg via ORAL
  Filled 2014-08-18 (×4): qty 1

## 2014-08-18 MED ORDER — 0.9 % SODIUM CHLORIDE (POUR BTL) OPTIME
TOPICAL | Status: DC | PRN
  Administered 2014-08-18: 2500 mL

## 2014-08-18 MED ORDER — HEPARIN SODIUM (PORCINE) 1000 UNIT/ML IJ SOLN
INTRAMUSCULAR | Status: DC | PRN
  Administered 2014-08-18: 8000 [IU] via INTRAVENOUS
  Administered 2014-08-18: 1000 [IU] via INTRAVENOUS

## 2014-08-18 MED ORDER — APIXABAN 2.5 MG PO TABS
2.5000 mg | ORAL_TABLET | Freq: Two times a day (BID) | ORAL | Status: DC
  Filled 2014-08-18 (×2): qty 1

## 2014-08-18 MED ORDER — ROCURONIUM BROMIDE 100 MG/10ML IV SOLN
INTRAVENOUS | Status: DC | PRN
  Administered 2014-08-18: 40 mg via INTRAVENOUS

## 2014-08-18 MED ORDER — OXYCODONE HCL 5 MG PO TABS
5.0000 mg | ORAL_TABLET | ORAL | Status: DC | PRN
  Administered 2014-08-18 – 2014-08-19 (×2): 5 mg via ORAL
  Filled 2014-08-18 (×2): qty 1

## 2014-08-18 MED ORDER — LABETALOL HCL 5 MG/ML IV SOLN
10.0000 mg | INTRAVENOUS | Status: AC | PRN
  Administered 2014-08-18 – 2014-08-19 (×4): 10 mg via INTRAVENOUS
  Filled 2014-08-18 (×3): qty 4

## 2014-08-18 MED ORDER — SODIUM CHLORIDE 0.9 % IR SOLN
Status: DC | PRN
  Administered 2014-08-18: 500 mL

## 2014-08-18 MED ORDER — GABAPENTIN 300 MG PO CAPS
300.0000 mg | ORAL_CAPSULE | Freq: Every day | ORAL | Status: DC
  Administered 2014-08-18: 300 mg via ORAL
  Filled 2014-08-18 (×2): qty 1

## 2014-08-18 MED ORDER — MAGNESIUM OXIDE 400 MG PO TABS
400.0000 mg | ORAL_TABLET | Freq: Every day | ORAL | Status: DC
  Administered 2014-08-18 – 2014-08-19 (×2): 400 mg via ORAL
  Filled 2014-08-18 (×2): qty 1

## 2014-08-18 MED ORDER — NEOMYCIN-POLYMYXIN-HC 3.5-10000-1 OT SUSP
1.0000 [drp] | Freq: Four times a day (QID) | OTIC | Status: DC
  Administered 2014-08-18 – 2014-08-19 (×3): 2 [drp] via OTIC
  Filled 2014-08-18: qty 10

## 2014-08-18 MED ORDER — ARTIFICIAL TEARS OP OINT
TOPICAL_OINTMENT | OPHTHALMIC | Status: DC | PRN
Start: 2014-08-18 — End: 2014-08-18
  Administered 2014-08-18: 1 via OPHTHALMIC

## 2014-08-18 MED ORDER — THROMBIN 20000 UNITS EX SOLR
CUTANEOUS | Status: AC
  Filled 2014-08-18: qty 20000

## 2014-08-18 MED ORDER — FUROSEMIDE 20 MG PO TABS
20.0000 mg | ORAL_TABLET | Freq: Every day | ORAL | Status: DC
  Administered 2014-08-18 – 2014-08-19 (×2): 20 mg via ORAL
  Filled 2014-08-18 (×2): qty 1

## 2014-08-18 MED ORDER — OXYCODONE HCL 5 MG/5ML PO SOLN: 5.0000 mg | Freq: Once | ORAL | Status: DC | PRN

## 2014-08-18 MED ORDER — VITAMIN D3 25 MCG (1000 UNIT) PO TABS
4000.0000 [IU] | ORAL_TABLET | Freq: Every morning | ORAL | Status: DC
  Administered 2014-08-18 – 2014-08-19 (×2): 4000 [IU] via ORAL
  Filled 2014-08-18 (×2): qty 4

## 2014-08-18 MED ORDER — PROTAMINE SULFATE 10 MG/ML IV SOLN
INTRAVENOUS | Status: DC | PRN
  Administered 2014-08-18: 50 mg via INTRAVENOUS

## 2014-08-18 MED ORDER — FENTANYL CITRATE (PF) 100 MCG/2ML IJ SOLN
INTRAMUSCULAR | Status: DC | PRN
  Administered 2014-08-18: 100 ug via INTRAVENOUS
  Administered 2014-08-18 (×3): 50 ug via INTRAVENOUS

## 2014-08-18 MED ORDER — BISACODYL 10 MG RE SUPP: 10.0000 mg | Freq: Every day | RECTAL | Status: DC | PRN

## 2014-08-18 MED ORDER — FENTANYL CITRATE (PF) 250 MCG/5ML IJ SOLN
INTRAMUSCULAR | Status: AC
  Filled 2014-08-18: qty 5

## 2014-08-18 MED ORDER — SODIUM CHLORIDE 0.9 % IV SOLN
10.0000 mg | INTRAVENOUS | Status: DC | PRN
  Administered 2014-08-18: 20 ug/min via INTRAVENOUS

## 2014-08-18 MED ORDER — DEXTROSE 5 % IV SOLN
1.5000 g | INTRAVENOUS | Status: AC
  Administered 2014-08-18: 1.5 g via INTRAVENOUS
  Filled 2014-08-18: qty 1.5

## 2014-08-18 MED ORDER — GLYCOPYRROLATE 0.2 MG/ML IJ SOLN
INTRAMUSCULAR | Status: DC | PRN
  Administered 2014-08-18: 0.3 mg via INTRAVENOUS

## 2014-08-18 MED ORDER — HYDRALAZINE HCL 20 MG/ML IJ SOLN: 5.0000 mg | INTRAMUSCULAR | Status: DC | PRN

## 2014-08-18 MED ORDER — CEFUROXIME SODIUM 1.5 G IJ SOLR
1.5000 g | Freq: Two times a day (BID) | INTRAMUSCULAR | Status: AC
  Administered 2014-08-18 – 2014-08-19 (×2): 1.5 g via INTRAVENOUS
  Filled 2014-08-18 (×2): qty 1.5

## 2014-08-18 MED ORDER — ESMOLOL HCL 10 MG/ML IV SOLN
INTRAVENOUS | Status: DC | PRN
  Administered 2014-08-18: 20 mg via INTRAVENOUS
  Administered 2014-08-18: 30 mg via INTRAVENOUS

## 2014-08-18 MED ORDER — LOSARTAN POTASSIUM 25 MG PO TABS
25.0000 mg | ORAL_TABLET | Freq: Every day | ORAL | Status: DC
  Administered 2014-08-18 – 2014-08-19 (×2): 25 mg via ORAL
  Filled 2014-08-18 (×2): qty 1

## 2014-08-18 MED ORDER — FENTANYL CITRATE (PF) 100 MCG/2ML IJ SOLN: 25.0000 ug | INTRAMUSCULAR | Status: DC | PRN

## 2014-08-18 MED ORDER — PHENOL 1.4 % MT LIQD: 1.0000 | OROMUCOSAL | Status: DC | PRN

## 2014-08-18 MED ORDER — ONDANSETRON HCL 4 MG/2ML IJ SOLN
INTRAMUSCULAR | Status: AC
  Filled 2014-08-18: qty 2

## 2014-08-18 MED ORDER — NEOSTIGMINE METHYLSULFATE 10 MG/10ML IV SOLN
INTRAVENOUS | Status: AC
  Filled 2014-08-18: qty 1

## 2014-08-18 MED ORDER — HEMOSTATIC AGENTS (NO CHARGE) OPTIME
TOPICAL | Status: DC | PRN
  Administered 2014-08-18: 1 via TOPICAL

## 2014-08-18 MED ORDER — SENNOSIDES-DOCUSATE SODIUM 8.6-50 MG PO TABS
1.0000 | ORAL_TABLET | Freq: Every evening | ORAL | Status: DC | PRN
  Filled 2014-08-18: qty 1

## 2014-08-18 MED ORDER — PANTOPRAZOLE SODIUM 40 MG PO TBEC
40.0000 mg | DELAYED_RELEASE_TABLET | Freq: Every day | ORAL | Status: DC
  Administered 2014-08-18 – 2014-08-19 (×2): 40 mg via ORAL
  Filled 2014-08-18 (×2): qty 1

## 2014-08-18 MED ORDER — OXYCODONE HCL 5 MG PO TABS: 5.0000 mg | ORAL_TABLET | Freq: Four times a day (QID) | ORAL | Status: DC | PRN

## 2014-08-18 MED ORDER — HEPARIN SODIUM (PORCINE) 1000 UNIT/ML IJ SOLN
INTRAMUSCULAR | Status: AC
  Filled 2014-08-18: qty 1

## 2014-08-18 MED ORDER — ARTIFICIAL TEARS OP OINT
TOPICAL_OINTMENT | OPHTHALMIC | Status: AC
  Filled 2014-08-18: qty 3.5

## 2014-08-18 MED ORDER — ESMOLOL HCL 10 MG/ML IV SOLN
INTRAVENOUS | Status: AC
  Filled 2014-08-18: qty 10

## 2014-08-18 MED ORDER — NEOSTIGMINE METHYLSULFATE 10 MG/10ML IV SOLN
INTRAVENOUS | Status: DC | PRN
  Administered 2014-08-18: 2.5 mg via INTRAVENOUS

## 2014-08-18 MED ORDER — GUAIFENESIN-DM 100-10 MG/5ML PO SYRP: 15.0000 mL | ORAL_SOLUTION | ORAL | Status: DC | PRN

## 2014-08-18 MED ORDER — POTASSIUM CHLORIDE CRYS ER 20 MEQ PO TBCR: 20.0000 meq | EXTENDED_RELEASE_TABLET | Freq: Every day | ORAL | Status: DC | PRN

## 2014-08-18 MED ORDER — GLYCOPYRROLATE 0.2 MG/ML IJ SOLN
INTRAMUSCULAR | Status: AC
  Filled 2014-08-18: qty 3

## 2014-08-18 MED ORDER — ALLOPURINOL 150 MG HALF TABLET
150.0000 mg | ORAL_TABLET | Freq: Every day | ORAL | Status: DC
  Administered 2014-08-18: 150 mg via ORAL
  Filled 2014-08-18 (×2): qty 1

## 2014-08-18 MED ORDER — OXYCODONE HCL 5 MG PO TABS: 5.0000 mg | ORAL_TABLET | Freq: Once | ORAL | Status: DC | PRN

## 2014-08-18 SURGICAL SUPPLY — 50 items
CANISTER SUCTION 2500CC (MISCELLANEOUS) ×3 IMPLANT
CATH ROBINSON RED A/P 18FR (CATHETERS) ×3 IMPLANT
CATH SUCT 10FR WHISTLE TIP (CATHETERS) ×3 IMPLANT
CLIP TI MEDIUM 6 (CLIP) ×3 IMPLANT
CLIP TI WIDE RED SMALL 6 (CLIP) ×3 IMPLANT
CRADLE DONUT ADULT HEAD (MISCELLANEOUS) ×3 IMPLANT
DRAIN CHANNEL 15F RND FF W/TCR (WOUND CARE) IMPLANT
ELECT REM PT RETURN 9FT ADLT (ELECTROSURGICAL) ×3
ELECTRODE REM PT RTRN 9FT ADLT (ELECTROSURGICAL) ×1 IMPLANT
EVACUATOR SILICONE 100CC (DRAIN) IMPLANT
GAUZE SPONGE 4X4 12PLY STRL (GAUZE/BANDAGES/DRESSINGS) ×3 IMPLANT
GEL ULTRASOUND 8.5O AQUASONIC (MISCELLANEOUS) ×3 IMPLANT
GLOVE BIO SURGEON STRL SZ 6.5 (GLOVE) ×6 IMPLANT
GLOVE BIO SURGEONS STRL SZ 6.5 (GLOVE) ×3
GLOVE BIOGEL PI IND STRL 7.0 (GLOVE) ×1 IMPLANT
GLOVE BIOGEL PI IND STRL 7.5 (GLOVE) ×1 IMPLANT
GLOVE BIOGEL PI INDICATOR 7.0 (GLOVE) ×2
GLOVE BIOGEL PI INDICATOR 7.5 (GLOVE) ×2
GLOVE SS BIOGEL STRL SZ 6.5 (GLOVE) ×1 IMPLANT
GLOVE SUPERSENSE BIOGEL SZ 6.5 (GLOVE) ×2
GLOVE SURG SS PI 7.5 STRL IVOR (GLOVE) ×3 IMPLANT
GOWN STRL REUS W/ TWL LRG LVL3 (GOWN DISPOSABLE) ×2 IMPLANT
GOWN STRL REUS W/ TWL XL LVL3 (GOWN DISPOSABLE) ×1 IMPLANT
GOWN STRL REUS W/TWL LRG LVL3 (GOWN DISPOSABLE) ×4
GOWN STRL REUS W/TWL XL LVL3 (GOWN DISPOSABLE) ×2
HEMOSTAT SNOW SURGICEL 2X4 (HEMOSTASIS) ×3 IMPLANT
INSERT FOGARTY SM (MISCELLANEOUS) IMPLANT
KIT BASIN OR (CUSTOM PROCEDURE TRAY) ×3 IMPLANT
KIT ROOM TURNOVER OR (KITS) ×3 IMPLANT
LIQUID BAND (GAUZE/BANDAGES/DRESSINGS) ×3 IMPLANT
NEEDLE HYPO 25GX1X1/2 BEV (NEEDLE) IMPLANT
NS IRRIG 1000ML POUR BTL (IV SOLUTION) ×9 IMPLANT
PACK CAROTID (CUSTOM PROCEDURE TRAY) ×3 IMPLANT
PAD ARMBOARD 7.5X6 YLW CONV (MISCELLANEOUS) ×6 IMPLANT
PATCH VASC XENOSURE 1CMX6CM (Vascular Products) ×2 IMPLANT
PATCH VASC XENOSURE 1X6 (Vascular Products) ×1 IMPLANT
SHUNT CAROTID BYPASS 10 (VASCULAR PRODUCTS) IMPLANT
SHUNT CAROTID BYPASS 12FRX15.5 (VASCULAR PRODUCTS) IMPLANT
SPONGE INTESTINAL PEANUT (DISPOSABLE) IMPLANT
SUT ETHILON 3 0 PS 1 (SUTURE) IMPLANT
SUT PROLENE 6 0 BV (SUTURE) ×12 IMPLANT
SUT PROLENE 7 0 BV 1 (SUTURE) IMPLANT
SUT PROLENE 7 0 BV1 MDA (SUTURE) ×3 IMPLANT
SUT SILK 3 0 TIES 17X18 (SUTURE)
SUT SILK 3-0 18XBRD TIE BLK (SUTURE) IMPLANT
SUT VIC AB 3-0 SH 27 (SUTURE) ×4
SUT VIC AB 3-0 SH 27X BRD (SUTURE) ×2 IMPLANT
SUT VICRYL 4-0 PS2 18IN ABS (SUTURE) ×3 IMPLANT
SYR CONTROL 10ML LL (SYRINGE) IMPLANT
WATER STERILE IRR 1000ML POUR (IV SOLUTION) ×3 IMPLANT

## 2014-08-18 NOTE — Care Management Note (Addendum)
Case Management Note  Patient Details  Name: [name] [name] MRN: [id number] Date of Birth: Jan 01, 1931  Subjective/Objective:                 Admitted with R carotid stenosis, s/p R carotid endarterectomy.   Action/Plan: Return to home when medically stable. CM to f/u with d/c needs.  Expected Discharge Date:                  Expected Discharge Plan:  Home/Self Care  In-House Referral:     Discharge planning Services  CM Consult  Post Acute Care Choice:    Choice offered to:     DME Arranged:    DME Agency:     HH Arranged:    HH Agency:     Status of Service:  In process, will continue to follow  Medicare Important Message Given:    Date Medicare IM Given:    Medicare IM give by:    Date Additional Medicare IM Given:    Additional Medicare Important Message give by:     If discussed at [place] of Stay Meetings, dates discussed:    Additional Comments: Wardell Heath (Daughter)  [phone]  Sharin Mons, RN 08/18/2014, 1:57 PM

## 2014-08-18 NOTE — Transfer of Care (Signed)
Immediate Anesthesia Transfer of Care Note  Patient: [name] [name]  Procedure(s) Performed: Procedure(s): ENDARTERECTOMY CAROTID (Right) PATCH ANGIOPLASTY USING XENOSURE BIOLOGIC PATCH 1CM X 6CM (Right)  Patient Location: PACU  Anesthesia Type:General  Level of Consciousness: awake, oriented, sedated, patient cooperative and responds to stimulation  Airway & Oxygen Therapy: Patient Spontanous Breathing and Patient connected to nasal cannula oxygen  Post-op Assessment: Report given to RN, Post -op Vital signs reviewed and stable, Patient moving all extremities, Patient moving all extremities X 4 and Patient able to stick tongue midline  Post vital signs: Reviewed and stable  Last Vitals:  Filed Vitals:   08/18/14 0552  BP: 159/46  Pulse: 55  Temp: 36.6 C  Resp: 19    Complications: No apparent anesthesia complications

## 2014-08-18 NOTE — Anesthesia Procedure Notes (Signed)
Procedure Name: Intubation Date/Time: 08/18/2014 7:48 AM Performed by: Jacquiline Doe A Pre-anesthesia Checklist: Patient identified, Emergency Drugs available, Suction available, Patient being monitored and Timeout performed Patient Re-evaluated:Patient Re-evaluated prior to inductionOxygen Delivery Method: Circle system utilized Preoxygenation: Pre-oxygenation with 100% oxygen Intubation Type: IV induction and Cricoid Pressure applied Ventilation: Mask ventilation without difficulty Laryngoscope Size: Mac and 4 Grade View: Grade I Tube type: Oral Tube size: 7.5 mm Number of attempts: 1 Airway Equipment and Method: Stylet Placement Confirmation: ETT inserted through vocal cords under direct vision,  positive ETCO2 and breath sounds checked- equal and bilateral Secured at: 22 cm Tube secured with: Tape Dental Injury: Teeth and Oropharynx as per pre-operative assessment

## 2014-08-18 NOTE — Op Note (Signed)
Patient name: [name] [name] MRN: [id number] DOB: 05-27-1930 Sex: female  08/17/2014 - 08/18/2014 Pre-operative Diagnosis: Asymptomatic   right carotid stenosis Post-operative diagnosis:  Same Surgeon:  Annamarie Major Assistants:  S. Rhyne Procedure:    right carotid Endarterectomy with bovine pericardial  patch angioplasty Anesthesia:  General Blood Loss:  See anesthesia record Specimens:  Carotid Plaque to pathology  Findings:  80 %stenosis; Thrombus:  none  Indications:  Patient has had a significant increase in her carotid velocities.  She is asymptomatic.  On doppler her bifurcation was high.  She had an angiogram yesterday to determine if she was a candidate for CEA.  Stenosis was 80% on angio.  She did have FMD in the distal ICA  Procedure:  The patient was identified in the holding area and taken to [place] [facility]  The patient was then placed supine on the table.   General endotrachial anesthesia was administered.  The patient was prepped and draped in the usual sterile fashion.  A time out was called and antibiotics were administered.  The incision was made along the anterior border of the right sternocleidomastoid muscle.  Cautery was used to dissect through the subcutaneous tissue.  The platysma muscle was divided with cautery.  The internal jugular vein was exposed along its anterior medial border.  The common facial vein was exposed and then divided between 2-0 silk ties and metal clips.  The common carotid artery was then circumferentially exposed and encircled with an umbilical tape.  The vagus nerve was identified and protected.  Next sharp dissection was used to expose the external carotid artery and the superior thyroid artery.  The were encircled with a blue vessel loop and a 2-0 silk tie respectively.  Finally, the internal carotid was carefully dissected free.  An umbilical tape was placed around the internal carotid artery distal to the diseased segment.  The hypoglossal nerve  was visualized throughout and protected.  The patient was given systemic heparinization.  A bovine carotid patch was selected and prepared on the back table.  A 10 french shunt was also prepared.  After blood pressure readings were appropriate and the heparin had been given time to circulate, the internal carotid artery was occluded with a baby Gregory clamp.  The external and common carotid arteries were then occluded with vascular clamps and the 2-0 tie tightened on the superior thyroid artery.  A #11 blade was used to make an arteriotomy in the common carotid artery.  This was extended with Potts scissors along the anterior and lateral border of the common and internal carotid artery.  Approximately 80% stenosis was identified.  There was no thrombus identified.  The 10 french shunt was not palced as I thought there was adequate backbleeding from the ICA,a nd due to her FMD in the distal ICA.  Marland Kitchen  A kleiner kuntz elevator was used to perform endarterectomy.  An eversion endarterectomy was performed in the external carotid artery.  A good distal endpoint was obtained in the internal carotid artery.  The specimen was removed and sent to pathology.  Heparinized saline was used to irrigate the endarterectomized field.  All potential embolic debris was removed.  Bovine pericardial patch angioplasty was then performed using a running 6-0 Prolene. The common internal and external carotid arteries were all appropriately flushed. The artery was again irrigated with heparin saline.  The anastomosis was then secured. The clamp was first released on the external carotid artery followed by the common  carotid artery approximately 30 seconds later, bloodflow was reestablish through the internal carotid artery.  Next, a hand-held  Doppler was used to evaluate the signals in the common, external, and internal  carotid arteries, all of which had appropriate signals. I then administered  50 mg protamine. The wound was then  irrigated.  After hemostasis was achieved, the carotid sheath was reapproximated with 3-0 Vicryl. The  platysma muscle was reapproximated with running 3-0 Vicryl. The skin  was closed with 4-0 Vicryl. Dermabond was placed on the skin. The  patient was then successfully extubated. Her neurologic exam was  similar to his preprocedural exam. The patient was then taken to recovery room  in stable condition. There were no complications.     Disposition:  To PACU in stable condition.  Relevant Operative Details:  High bifurcation.  No shunt was place because I thought there was adequate backbleeding, and I was concerned about her FMD distally.  Normal anatomy.  The lesion was about 2cm into the ICA and involved the bifurcation  V. Annamarie Major, M.D. [facility] Office: [phone] Pager:  [phone]

## 2014-08-18 NOTE — Anesthesia Preprocedure Evaluation (Signed)
Anesthesia Evaluation  Patient identified by MRN, date of birth, ID band Patient awake    Reviewed: Allergy & Precautions, NPO status , Patient's Chart, lab work & pertinent test results  Airway Mallampati: II   Neck ROM: full    Dental   Pulmonary neg pulmonary ROS,  breath sounds clear to auscultation        Cardiovascular hypertension, + Peripheral Vascular Disease and +CHF + dysrhythmias Atrial Fibrillation Rhythm:regular Rate:Normal     Neuro/Psych Anxiety    GI/Hepatic GERD-  ,  Endo/Other  diabetes, Type 2obese  Renal/GU Renal InsufficiencyRenal disease     Musculoskeletal  (+) Arthritis -,   Abdominal   Peds  Hematology   Anesthesia Other Findings   Reproductive/Obstetrics                             Anesthesia Physical Anesthesia Plan  ASA: III  Anesthesia Plan: General   Post-op Pain Management:    Induction: Intravenous  Airway Management Planned: Oral ETT  Additional Equipment: Arterial line  Intra-op Plan:   Post-operative Plan: Extubation in OR  Informed Consent: I have reviewed the patients History and Physical, chart, labs and discussed the procedure including the risks, benefits and alternatives for the proposed anesthesia with the patient or authorized representative who has indicated his/her understanding and acceptance.     Plan Discussed with: CRNA, Anesthesiologist and Surgeon  Anesthesia Plan Comments:         Anesthesia Quick Evaluation

## 2014-08-18 NOTE — Interval H&P Note (Signed)
History and Physical Interval Note:  08/18/2014 7:24 AM  [name] [name]  has presented today for surgery, with the diagnosis of Right carotid stenosis I65.21  The various methods of treatment have been discussed with the patient and family. After consideration of risks, benefits and other options for treatment, the patient has consented to  Procedure(s): ENDARTERECTOMY CAROTID (Right) as a surgical intervention .  The patient's history has been reviewed, patient examined, no change in status, stable for surgery.  I have reviewed the patient's chart and labs.  Questions were answered to the patient's satisfaction.     Less [place], [place]  No neuro changes.  No issues with angio yesterday WB

## 2014-08-18 NOTE — Progress Notes (Signed)
Pt transported to OR by OR transport via bed.

## 2014-08-18 NOTE — Anesthesia Postprocedure Evaluation (Signed)
Anesthesia Post Note  Patient: [name] [name]  Procedure(s) Performed: Procedure(s) (LRB): ENDARTERECTOMY CAROTID (Right) PATCH ANGIOPLASTY USING XENOSURE BIOLOGIC PATCH 1CM X 6CM (Right)  Anesthesia type: General  Patient location: PACU  Post pain: Pain level controlled and Adequate analgesia  Post assessment: Post-op Vital signs reviewed, Patient's Cardiovascular Status Stable, Respiratory Function Stable, Patent Airway and Pain level controlled  Last Vitals:  Filed Vitals:   08/18/14 1100  BP:   Pulse: 61  Temp: 36.2 C  Resp: 16    Post vital signs: Reviewed and stable  Level of consciousness: awake, alert  and oriented  Complications: No apparent anesthesia complications

## 2014-08-19 ENCOUNTER — Encounter (HOSPITAL_COMMUNITY): Payer: Self-pay | Admitting: Surgery

## 2014-08-19 ENCOUNTER — Telehealth: Payer: Self-pay | Admitting: Surgery

## 2014-08-19 LAB — CBC
HEMATOCRIT: 36.1 % (ref 36.0–46.0)
Hemoglobin: 11.9 g/dL — ABNORMAL LOW (ref 12.0–15.0)
MCH: 31.2 pg (ref 26.0–34.0)
MCHC: 33 g/dL (ref 30.0–36.0)
MCV: 94.5 fL (ref 78.0–100.0)
PLATELETS: 197 10*3/uL (ref 150–400)
RBC: 3.82 MIL/uL — AB (ref 3.87–5.11)
RDW: 13.7 % (ref 11.5–15.5)
WBC: 11.4 10*3/uL — ABNORMAL HIGH (ref 4.0–10.5)

## 2014-08-19 LAB — BASIC METABOLIC PANEL
Anion gap: 11 (ref 5–15)
BUN: 26 mg/dL — ABNORMAL HIGH (ref 6–20)
CHLORIDE: 101 mmol/L (ref 101–111)
CO2: 26 mmol/L (ref 22–32)
CREATININE: 1.25 mg/dL — AB (ref 0.44–1.00)
Calcium: 9.4 mg/dL (ref 8.9–10.3)
GFR calc Af Amer: 45 mL/min — ABNORMAL LOW (ref >60)
GFR calc non Af Amer: 39 mL/min — ABNORMAL LOW (ref >60)
GLUCOSE: 146 mg/dL — AB (ref 65–99)
Potassium: 3.8 mmol/L (ref 3.5–5.1)
SODIUM: 138 mmol/L (ref 135–145)

## 2014-08-19 LAB — GLUCOSE, CAPILLARY
GLUCOSE-CAPILLARY: 144 mg/dL — AB (ref 65–99)
Glucose-Capillary: 151 mg/dL — ABNORMAL HIGH (ref 65–99)

## 2014-08-19 MED ORDER — APIXABAN 2.5 MG PO TABS: 2.5000 mg | ORAL_TABLET | Freq: Two times a day (BID) | ORAL | Status: DC

## 2014-08-19 NOTE — Telephone Encounter (Signed)
-----   Message from Mena Goes, RN sent at 08/18/2014 11:09 AM EDT ----- Regarding: Schedule   ----- Message -----    From: Gabriel Earing, PA-C    Sent: 08/18/2014  10:17 AM      To: [facility] Charge Pool  S/p right CEA 08/18/14.  F/u with WB in 2 weeks.  Thanks, Aldona Bar

## 2014-08-19 NOTE — Care Management Important Message (Signed)
Important Message  Patient Details  Name: [name] [name] MRN: [id number] Date of Birth: 1930-12-30   Medicare Important Message Given:  Yes-second notification given    Delorse Lek 08/19/2014, 12:21 PM

## 2014-08-19 NOTE — Progress Notes (Addendum)
  Progress Note    08/19/2014 7:21 AM 1 Day Post-Op  Subjective:  C/o sore throat, but no trouble swallowing  Afebrile HR 60's-70's NSR 809'X-833'A systolic 25% RA  Filed Vitals:   08/19/14 0440  BP: 152/50  Pulse: 62  Temp:   Resp: 14     Physical Exam: Neuro:  In tact; tongue is midline Incision:  C/d/i with small hematoma proximally.  Has not enlarged since it was marked at [facility]  CBC    Component Value Date/Time   WBC 11.4* 08/19/2014 0120   RBC 3.82* 08/19/2014 0120   RBC 3.67* 08/31/2013 1134   HGB 11.9* 08/19/2014 0120   HCT 36.1 08/19/2014 0120   PLT 197 08/19/2014 0120   MCV 94.5 08/19/2014 0120   MCH 31.2 08/19/2014 0120   MCHC 33.0 08/19/2014 0120   RDW 13.7 08/19/2014 0120   LYMPHSABS 1.5 06/08/2014 1109   MONOABS 0.7 06/08/2014 1109   EOSABS 0.3 06/08/2014 1109   BASOSABS 0.0 06/08/2014 1109    BMET    Component Value Date/Time   NA 138 08/19/2014 0120   K 3.8 08/19/2014 0120   CL 101 08/19/2014 0120   CO2 26 08/19/2014 0120   GLUCOSE 146* 08/19/2014 0120   BUN 26* 08/19/2014 0120   CREATININE 1.25* 08/19/2014 0120   CREATININE 1.64* 06/08/2014 1109   CALCIUM 9.4 08/19/2014 0120   GFRNONAA 39* 08/19/2014 0120   GFRNONAA 29* 06/08/2014 1109   GFRAA 45* 08/19/2014 0120   GFRAA 33* 06/08/2014 1109     Intake/Output Summary (Last 24 hours) at 08/19/14 0721 Last data filed at 08/19/14 0356  Gross per 24 hour  Intake   1625 ml  Output   2400 ml  Net   -775 ml      Assessment/Plan:  This is a 81 y.o. female who is s/p right CEA 1 Day Post-Op  -pt is doing well this am. -she does have a small hematoma at the proximal portion of the incision.  This was marked at MN and has not enlarged since then.  Will hold off on restarting Eliquis until tomorrow.  -pt neuro exam is in tact -pt has ambulated -pt has voided -discharge this morning and f/u with Dr. Trula Slade in 2 weeks.  Leontine Locket, PA-C [facility] [phone]   Agree with the above Stable hematoma, nno SOB or dysphasia OK for d/c Start Eliquis tomorrow  [facility] [employer]

## 2014-08-19 NOTE — Discharge Summary (Signed)
Discharge Summary     [name] [name] 1930/02/26 81 y.o. female  [id number]  Admission Date: 08/17/2014  Discharge Date: 08/19/14  Physician: Serafina Mitchell, MD  Admission Diagnosis: carotid stenosis Right carotid stenosis I65.21   HPI:   This is a 81 y.o. female who comes in today for evaluation of right carotid stenosis. The patient has been followed for asymptomatic stenosis. One year ago, the right sided velocities were in the 1-39 percent range. They recently progressed to greater than 24. The left side remained stable and the 40-59 percent category. The patient is asymptomatic. Specifically, she denies numbness or weakness in either extremity. She denies slurred speech. She denies amaurosis fugax. There is a family history of stroke in her mother.  The patient suffers from diabetes. She also has chronic renal insufficiency she is a nonsmoker. She has a history of atrial fibrillation, status post cardioversion. She takes Eliquis.  Hospital Course:  The patient was admitted to the hospital and taken to the [facility] lab on 08/17/2014 and underwent a right carotid angiogram.  She was found to have an 80% right carotid artery stenosis with a high bifurcation, but should be surgically accessible.   On 08/18/2014, she was taken to the operating room and underwent right carotid endarterectomy.  The pt tolerated the procedure well and was transported to the PACU in good condition.   By POD 1, the pt neuro status was in tact.  She did have a small hematoma at the proximal portion of the incision.  She was not having any shortness of breath or dysphasia.  Restarting her Eliquis was delayed one day.  The remainder of the hospital course consisted of increasing mobilization and increasing intake of solids without difficulty.    Recent Labs  08/17/14 1515 08/19/14 0120  NA 139 138  K 3.5 3.8  CL 99* 101  CO2 28 26  GLUCOSE 166* 146*  BUN 45* 26*  CALCIUM 9.7 9.4     Recent Labs  08/17/14 1515 08/19/14 0120  WBC 5.9 11.4*  HGB 12.3 11.9*  HCT 37.4 36.1  PLT 209 197    Recent Labs  08/17/14 1515  INR 1.11     Discharge Instructions    CAROTID Sugery: Call MD for difficulty swallowing or speaking; weakness in arms or legs that is a new symtom; severe headache.  If you have increased swelling in the neck and/or  are having difficulty breathing, CALL 911    Complete by:  As directed      Call MD for:  redness, tenderness, or signs of infection (pain, swelling, bleeding, redness, odor or green/yellow discharge around incision site)    Complete by:  As directed      Call MD for:  severe or increased pain, loss or decreased feeling  in affected limb(s)    Complete by:  As directed      Call MD for:  temperature >100.5    Complete by:  As directed      Discharge instructions    Complete by:  As directed   Restart Eliquis on Saturday 08/20/14.     Discharge wound care:    Complete by:  As directed   Shower daily with soap and water starting 08/20/14     Driving Restrictions    Complete by:  As directed   No driving for 2 weeks     Lifting restrictions    Complete by:  As directed   No lifting for 2 weeks  Resume previous diet    Complete by:  As directed            Discharge Diagnosis:  carotid stenosis Right carotid stenosis I65.21  Secondary Diagnosis: Patient Active Problem List   Diagnosis Date Noted  . Carotid artery stenosis, asymptomatic 08/18/2014  . Carotid stenosis 08/17/2014  . Atrial fibrillation with RVR   . Medication management 06/08/2014  . Dysuria 06/08/2014  . A-fib 06/02/2014  . DM II (diabetes mellitus, type II), controlled 06/01/2014  . T2_NIDDM w/CKD 02/23/2014  . Encounter for long-term (current) use of medications 08/03/2013  . Carotid artery stenosis   . Chronic diastolic CHF (congestive heart failure), NYHA class 1   . CKD stage 3 due to type 2 diabetes mellitus 02/01/2013  . Essential  hypertension 02/01/2013  . Mixed hyperlipidemia 02/01/2013  . Reflux esophagitis 02/01/2013  . Gout   . Vitamin D deficiency    Past Medical History  Diagnosis Date  . Obesity   . Hyperlipidemia   . Type II or unspecified type diabetes mellitus without mention of complication, not stated as uncontrolled   . Allergy   . Arthritis   . Gout   . Vitamin D deficiency   . Carotid artery stenosis     1-39% right and 40-59% left by dopplers 2015  . Chronic diastolic CHF (congestive heart failure), NYHA class 1   . Hypertension   . Atrial fibrillation   . CKD (chronic kidney disease), stage III   . GERD (gastroesophageal reflux disease)   . Carotid stenosis   . Anxiety     mild      Medication List    TAKE these medications        allopurinol 300 MG tablet  Commonly known as:  ZYLOPRIM  Take 150 mg by mouth at bedtime.     ALPRAZolam 1 MG tablet  Commonly known as:  XANAX  TAKE 1/2 TO 1 TABLET EVERY DAY AT BEDTIME AS NEEDED FOR SLEEP     cholecalciferol 1000 UNITS tablet  Commonly known as:  VITAMIN D  Take 4,000 Units by mouth every morning.     diltiazem 240 MG 24 hr tablet  Commonly known as:  CARDIZEM LA  Take 1 tablet (240 mg total) by mouth daily.     diltiazem 240 MG 24 hr capsule  Commonly known as:  CARDIZEM CD  TAKE 1 TABLET BY MOUTH EVERY DAY     ELIQUIS 2.5 MG Tabs tablet  Generic drug:  apixaban  TAKE 1 TABLET BY MOUTH TWICE A DAY     Flax Seed Oil 1000 MG Caps  Take 1 capsule by mouth every morning.     freestyle lancets  Check blood glucose 1 time daily.  DX-E11.22     furosemide 40 MG tablet  Commonly known as:  LASIX  Take 20 mg by mouth daily.     gabapentin 300 MG capsule  Commonly known as:  NEURONTIN  TAKE ONE CAPSULE BY MOUTH EVERY DAY AT BEDTIME     gemfibrozil 600 MG tablet  Commonly known as:  LOPID  TAKE 1 TABLET BY MOUTH TWICE DAILY WITH MEALS FOR CHOLESTEROL     losartan 25 MG tablet  Commonly known as:  COZAAR  Take 25 mg  by mouth daily.     magnesium oxide 400 MG tablet  Commonly known as:  MAG-OX  Take 400 mg by mouth daily.     metoprolol 50 MG tablet  Commonly known as:  LOPRESSOR  Take 1 tablet (50 mg total) by mouth 2 (two) times daily.     neomycin-polymyxin-hydrocortisone 3.5-10000-1 otic suspension  Commonly known as:  CORTISPORIN  Place 1-2 drops into the right ear 4 (four) times daily.     omeprazole 20 MG capsule  Commonly known as:  PRILOSEC  TAKE ONE CAPSULE EVERY MORNING FOR ACID     oxyCODONE 5 MG immediate release tablet  Commonly known as:  ROXICODONE  Take 1 tablet (5 mg total) by mouth every 6 (six) hours as needed.     ranitidine 300 MG tablet  Commonly known as:  ZANTAC  Take 1 tablet (300 mg total) by mouth as needed for heartburn.     vitamin B-12 1000 MCG tablet  Commonly known as:  CYANOCOBALAMIN  Take 1,000 mcg by mouth every morning.     vitamin C 500 MG tablet  Commonly known as:  ASCORBIC ACID  Take 500 mg by mouth 2 (two) times daily.        Prescriptions given: Roxicodone #20 No Refill  Instructions: 1.  No driving x 2 weeks 2.  No heavy lifting x 2 weeks 3.  Resume Eliquis Saturday 08/20/14  Disposition: home  Patient's condition: is Good  Follow up: 1. Dr. Trula Slade in 2 weeks. 2. Primary care physician for blood pressure control.   Leontine Locket, PA-C [facility] [phone]   --- For [facility] use --- Instructions: Press F2 to tab through selections.  Delete question if not applicable.   Modified Rankin score at D/C (0-6): 0   IV medication needed for:  1. Hypertension: Yes-labetalol x 4 2. Hypotension: No  Post-op Complications: No  1. Post-op CVA or TIA: No  If yes: Event classification (right eye, left eye, right cortical, left cortical, verterobasilar, other): n/a  If yes: Timing of event (intra-op, <6 hrs post-op, >=6 hrs post-op, unknown): n/a  2. CN injury: No  If yes: CN n/a injuried   3.  Myocardial infarction: No  If yes: Dx by (EKG or clinical, Troponin): n/a  4.  CHF: No  5.  Dysrhythmia (new): No  6. Wound infection: No  7. Reperfusion symptoms: No  8. Return to OR: No  If yes: return to OR for (bleeding, neurologic, other CEA incision, other): n/a  Discharge medications: Statin use:  No If No: [ ]  For Medical reasons, [ ]  Non-compliant, [ ]  Not-indicated ASA use:  No  If No: [ ]  For Medical reasons, [ ]  Non-compliant, [ ]  Not-indicated Beta blocker use:  Yes If No: [ ]  For Medical reasons, [ ]  Non-compliant, [ ]  Not-indicated ACE-Inhibitor use:  No If No: [ ]  For Medical reasons, [ ]  Non-compliant, [ ]  Not-indicated ARB use:  yes P2Y12 Antagonist use: No, [ ]  Plavix, [ ]  Plasugrel, [ ]  Ticlopinine, [ ]  Ticagrelor, [ ]  Other, [ ]  No for medical reason, [ ]  Non-compliant, [ ]  Not-indicated Anti-coagulant use:  Yes, [ ]  Warfarin, [ ]  Rivaroxaban, [ ]  Dabigatran, [ ]  Other, [ x] Eliquis No for medical reason, [ ]  Non-compliant, [ ]  Not-indicated

## 2014-08-19 NOTE — Progress Notes (Signed)
Pt d/c home per MD order, pt and family verbalized understanding of d/c, d/c instructions given, all questions answered

## 2014-08-19 NOTE — Progress Notes (Signed)
Pt for dc home today. Gave nse 30day free eliquis card.

## 2014-08-22 ENCOUNTER — Ambulatory Visit (INDEPENDENT_AMBULATORY_CARE_PROVIDER_SITE_OTHER): Payer: Medicare Other | Admitting: Physician Assistant

## 2014-08-22 ENCOUNTER — Encounter: Payer: Self-pay | Admitting: Physician Assistant

## 2014-08-22 VITALS — BP 138/70 | HR 64 | Temp 98.6°F | Resp 18 | Ht 64.0 in | Wt 189.4 lb

## 2014-08-22 DIAGNOSIS — Z0000 Encounter for general adult medical examination without abnormal findings: Secondary | ICD-10-CM

## 2014-08-22 DIAGNOSIS — Z79899 Other long term (current) drug therapy: Secondary | ICD-10-CM

## 2014-08-22 DIAGNOSIS — Z0001 Encounter for general adult medical examination with abnormal findings: Secondary | ICD-10-CM

## 2014-08-22 DIAGNOSIS — R6889 Other general symptoms and signs: Secondary | ICD-10-CM | POA: Diagnosis not present

## 2014-08-22 DIAGNOSIS — E559 Vitamin D deficiency, unspecified: Secondary | ICD-10-CM

## 2014-08-22 DIAGNOSIS — I519 Heart disease, unspecified: Secondary | ICD-10-CM

## 2014-08-22 DIAGNOSIS — E2839 Other primary ovarian failure: Secondary | ICD-10-CM

## 2014-08-22 DIAGNOSIS — I6523 Occlusion and stenosis of bilateral carotid arteries: Secondary | ICD-10-CM

## 2014-08-22 DIAGNOSIS — Z6832 Body mass index (BMI) 32.0-32.9, adult: Secondary | ICD-10-CM

## 2014-08-22 DIAGNOSIS — I5032 Chronic diastolic (congestive) heart failure: Secondary | ICD-10-CM

## 2014-08-22 DIAGNOSIS — N183 Chronic kidney disease, stage 3 (moderate): Secondary | ICD-10-CM

## 2014-08-22 DIAGNOSIS — E782 Mixed hyperlipidemia: Secondary | ICD-10-CM

## 2014-08-22 DIAGNOSIS — J0100 Acute maxillary sinusitis, unspecified: Secondary | ICD-10-CM

## 2014-08-22 DIAGNOSIS — Z9181 History of falling: Secondary | ICD-10-CM

## 2014-08-22 DIAGNOSIS — I4891 Unspecified atrial fibrillation: Secondary | ICD-10-CM

## 2014-08-22 DIAGNOSIS — I10 Essential (primary) hypertension: Secondary | ICD-10-CM

## 2014-08-22 DIAGNOSIS — M1000 Idiopathic gout, unspecified site: Secondary | ICD-10-CM

## 2014-08-22 DIAGNOSIS — E1122 Type 2 diabetes mellitus with diabetic chronic kidney disease: Secondary | ICD-10-CM

## 2014-08-22 DIAGNOSIS — Z1331 Encounter for screening for depression: Secondary | ICD-10-CM

## 2014-08-22 MED ORDER — DOXYCYCLINE HYCLATE 100 MG PO CAPS: ORAL_CAPSULE | ORAL | Status: DC

## 2014-08-22 NOTE — Progress Notes (Signed)
Follow up and Medicare wellness Assessment:   1. Type 2 diabetes mellitus with diabetic chronic kidney disease Discussed general issues about diabetes pathophysiology and management., Educational material distributed., Suggested low cholesterol diet., Encouraged aerobic exercise., Discussed foot care., Reminded to get yearly retinal exam. - [facility] DIABETES FOOT EXAM  2. Essential hypertension - continue medications, currently it is at goal, DASH diet, exercise and monitor at home. Call if greater than 130/80.   3. Mixed hyperlipidemia -continue medications, check lipids, decrease fatty foods, increase activity.   4. Chronic diastolic CHF (congestive heart failure), NYHA class 1 Monitor weight  5. Acute maxillary sinusitis, recurrence not specified Given doxycycline, follow up if not better, go to ER if symptoms worsen.  6. Estrogen deficiency - DG Bone Density; Future  7. At moderate risk for fall Discuss fall risk and fall prevention  8. Depression screen negative  9. BMI 32.0-32.9,adult Obesity with co morbidities- long discussion about weight loss, diet, and exercise  10. CKD stage 3 due to type 2 diabetes mellitus Discussed general issues about diabetes pathophysiology and management., Educational material distributed., Suggested low cholesterol diet., Encouraged aerobic exercise., Discussed foot care., Reminded to get yearly retinal exam.  avoid NSAIDS, monitor sugars, will monitor  11. Vitamin D deficiency Continue supplement  12. Medication management Check labs next OV  13. Idiopathic gout, unspecified chronicity, unspecified site Gout- recheck Uric acid as needed, Diet discussed, continue medications.  14. Carotid artery stenosis, bilateral Tolerating well, neck without weeping/signs of infection, no fever, chills, no HA, dizziness. Will continue to monitor.  15. Controlled atrial fibrillation In NSR, continue medications the same.   16. Encounter for Medicare  annual wellness exam Set up DEXA, get MGM Colonoscopy- patient declines a colonoscopy even though the risks and benefits were discussed at length. Colon cancer is 3rd most diagnosed cancer and 2nd leading cause of death in both men and women 43 years of age and older. Patient understands the risk of cancer and death with declining the test however they are willing to do cologuard screening instead. They understand that this is not as sensitive or specific as a colonoscopy and they are still recommended to get a colonoscopy. The cologuard will be sent out to their house.    Plan:   During the course of the visit the patient was educated and counseled about appropriate screening and preventive services including:    Pneumococcal vaccine   Influenza vaccine  Td vaccine  Screening electrocardiogram  Screening mammography  Bone densitometry screening  Colorectal cancer screening  Diabetes screening  Glaucoma screening  Nutrition counseling   Conditions/risks identified: BMI: Discussed weight loss, diet, and increase physical activity.  Increase physical activity: AHA recommends 150 minutes of physical activity a week.  Medications reviewed DEXA- requested this year Diabetes is at goal, ACE/ARB therapy: Yes. Urinary Incontinence is an issue: discussed non pharmacology and pharmacology options. Does not want meds at this time.  Fall risk: moderate-high- discussed PT, home fall assessment, medications. Will do physical therapy for balance, going out of town to visit son and wants to do when she gets back.   Subjective:   [name] [name] is a 81 y.o. female who presents for Medicare Annual Wellness Visit and ear pain.  Date of last medicare wellness visit was 04/2013  Her blood pressure has been controlled at home, today their BP is BP: 138/70 mmHg She does workout, works out in yard, walks in house, will start to do stationary bike. She denies chest  pain, dizziness.  Has normal  stress test 06/2013 and Echo showed normal EF with mild diastolic dysfunction and mild MR.  She was admitted 06/01/2014 for Afib with RVR, CHADSVASC 7, started on elliquis and is status post cardioversion in may with Dr. Radford Pax.  She had right carotid stenosis s/p endarterectomy 5 days ago, 08/17/2014 with Dr. Trula Slade. She has been having right ear pain on 07/09 and have chest congestion with yellow mucus occ clear. She is having sinus pain, teeth pain, and right ear pain. Given drops for her ear in the hospital. She has has some sore throat since the surgery. Has right sided face pain.  She is on cholesterol medication, lopid, and denies myalgias. Her cholesterol is at goal. The cholesterol last visit was:   Lab Results  Component Value Date   CHOL 136 06/20/2014   HDL 56.50 06/20/2014   LDLCALC 64 06/20/2014   LDLDIRECT 100.8 02/14/2014   TRIG 80.0 06/20/2014   CHOLHDL 2 06/20/2014   She has been working on diet and exercise for diabetes with CKD x 2003, and denies polydipsia, polyuria and visual disturbances. Last A1C in the office was:  Lab Results  Component Value Date   HGBA1C 6.3* 02/23/2014   Patient is on Vitamin D supplement. Lab Results  Component Value Date   VD25OH 52 02/23/2014   Patient is on allopurinol for gout and does report a recent flare.  Lab Results  Component Value Date   LABURIC 5.1 02/23/2014   BMI is Body mass index is 32.49 kg/(m^2)., she is working on diet and exercise. Wt Readings from Last 3 Encounters:  08/22/14 189 lb 6.4 oz (85.911 kg)  08/18/14 183 lb 9.6 oz (83.28 kg)  08/01/14 188 lb (85.276 kg)    Names of Other Physician/Practitioners you currently use: 1.  [facility]- here for primary care 2. Dr. Sabra Heck, eye doctor, last visit Aug 2014  Dr. Kathyrn Lass Dr. Eddie Dibbles Dr. Arita Miss Dr. Radford Pax Patient Care Team: Unk Pinto, MD as PCP - General (Internal Medicine) Sueanne Margarita, MD as Consulting  Physician (Cardiology) Lorretta Harp, MD as Consulting Physician (Cardiology)  Medication Review Current Outpatient Prescriptions on File Prior to Visit  Medication Sig Dispense Refill  . allopurinol (ZYLOPRIM) 300 MG tablet Take 150 mg by mouth at bedtime.    . ALPRAZolam (XANAX) 1 MG tablet TAKE 1/2 TO 1 TABLET EVERY DAY AT BEDTIME AS NEEDED FOR SLEEP 30 tablet 3  . cholecalciferol (VITAMIN D) 1000 UNITS tablet Take 4,000 Units by mouth every morning.     . diltiazem (CARDIZEM CD) 240 MG 24 hr capsule TAKE 1 TABLET BY MOUTH EVERY DAY 60 capsule 6  . diltiazem (CARDIZEM LA) 240 MG 24 hr tablet Take 1 tablet (240 mg total) by mouth daily. 30 tablet 6  . ELIQUIS 2.5 MG TABS tablet TAKE 1 TABLET BY MOUTH TWICE A DAY 60 tablet 5  . Flaxseed, Linseed, (FLAX SEED OIL) 1000 MG CAPS Take 1 capsule by mouth every morning.    . furosemide (LASIX) 40 MG tablet Take 20 mg by mouth daily.  6  . gabapentin (NEURONTIN) 300 MG capsule TAKE ONE CAPSULE BY MOUTH EVERY DAY AT BEDTIME 90 capsule 4  . gemfibrozil (LOPID) 600 MG tablet TAKE 1 TABLET BY MOUTH TWICE DAILY WITH MEALS FOR CHOLESTEROL 180 tablet 0  . Lancets (FREESTYLE) lancets Check blood glucose 1 time daily.  DX-E11.22 100 each 6  . losartan (COZAAR) 25 MG tablet Take 25  mg by mouth daily.  11  . magnesium oxide (MAG-OX) 400 MG tablet Take 400 mg by mouth daily.    . metoprolol (LOPRESSOR) 50 MG tablet Take 1 tablet (50 mg total) by mouth 2 (two) times daily. 60 tablet 6  . neomycin-polymyxin-hydrocortisone (CORTISPORIN) 3.5-10000-1 otic suspension Place 1-2 drops into the right ear 4 (four) times daily.  0  . omeprazole (PRILOSEC) 20 MG capsule TAKE ONE CAPSULE EVERY MORNING FOR ACID 90 capsule 1  . oxyCODONE (ROXICODONE) 5 MG immediate release tablet Take 1 tablet (5 mg total) by mouth every 6 (six) hours as needed. 20 tablet 0  . ranitidine (ZANTAC) 300 MG tablet Take 1 tablet (300 mg total) by mouth as needed for heartburn. 60 tablet 3  .  vitamin B-12 (CYANOCOBALAMIN) 1000 MCG tablet Take 1,000 mcg by mouth every morning.    . vitamin C (ASCORBIC ACID) 500 MG tablet Take 500 mg by mouth 2 (two) times daily.      No current facility-administered medications on file prior to visit.    Current Problems (verified) Patient Active Problem List   Diagnosis Date Noted  . Carotid artery stenosis, asymptomatic 08/18/2014  . Carotid stenosis 08/17/2014  . Atrial fibrillation with RVR   . Medication management 06/08/2014  . Dysuria 06/08/2014  . A-fib 06/02/2014  . DM II (diabetes mellitus, type II), controlled 06/01/2014  . T2_NIDDM w/CKD 02/23/2014  . Encounter for long-term (current) use of medications 08/03/2013  . Carotid artery stenosis   . Chronic diastolic CHF (congestive heart failure), NYHA class 1   . CKD stage 3 due to type 2 diabetes mellitus 02/01/2013  . Essential hypertension 02/01/2013  . Mixed hyperlipidemia 02/01/2013  . Reflux esophagitis 02/01/2013  . Gout   . Vitamin D deficiency     Screening Tests Health Maintenance  Topic Date Due  . COLONOSCOPY  02/17/1982  . ZOSTAVAX  02/18/1992  . HEMOGLOBIN A1C  08/24/2014  . INFLUENZA VACCINE  09/05/2014  . OPHTHALMOLOGY EXAM  11/17/2014  . FOOT EXAM  02/24/2015  . URINE MICROALBUMIN  02/24/2015  . TETANUS/TDAP  12/22/2018  . DEXA SCAN  Completed  . PNA vac Low Risk Adult  Completed     Immunization History  Administered Date(s) Administered  . Influenza Split 10/23/2012  . Influenza, High Dose Seasonal PF 11/11/2013  . Pneumococcal Conjugate-13 11/11/2013  . Pneumococcal Polysaccharide-23 02/04/2006  . Td 12/21/2008   Preventative care: Last colonoscopy: 2006 DUE but declines due to age will do cologuard.  Last mammogram:  03/2013 DUE Last pap smear/pelvic exam: remote   DEXA: 05/26/2012 DUE CXR 2016 Stress test 06/2014 Echo 2016 US renal 2015  Prior vaccinations: TD or Tdap: 2010  Influenza: 10/2013  Prevnar 2015 Pneumococcal:  2008 Shingles/Zostavax: declines  Allergies Allergies  Allergen Reactions  . Codeine Other (See Comments)    "felt like things were crawling on me"  . Lisinopril Other (See Comments)    fatigue  . Tylenol Pm Extra [Diphenhydramine-Apap (Sleep)]    Surgical history Past Surgical History  Procedure Laterality Date  . Joint replacement    . Dilitation and currage    . Vein surgery    . Cataract extraction    . Tee without cardioversion N/A 06/17/2014    Procedure: TRANSESOPHAGEAL ECHOCARDIOGRAM (TEE);  Surgeon: Fay Records, MD;  Location: [facility] ENDOSCOPY;  Service: Cardiovascular;  Laterality: N/A;  . Cardioversion N/A 06/17/2014    Procedure: CARDIOVERSION;  Surgeon: Fay Records, MD;  Location: [place];  Service: Cardiovascular;  Laterality: N/A;  . Peripheral vascular catheterization N/A 08/17/2014    Procedure: Carotid Angiography;  Surgeon: Serafina Mitchell, MD;  Location: [place] CV LAB;  Service: Cardiovascular;  Laterality: N/A;  . Endarterectomy  08/17/2014  . Endarterectomy Right 08/18/2014    Procedure: ENDARTERECTOMY CAROTID;  Surgeon: Serafina Mitchell, MD;  Location: [facility] OR;  Service: Vascular;  Laterality: Right;  . Patch angioplasty Right 08/18/2014    Procedure: PATCH ANGIOPLASTY USING XENOSURE BIOLOGIC PATCH 1CM X 6CM;  Surgeon: Serafina Mitchell, MD;  Location: [facility] OR;  Service: Vascular;  Laterality: Right;   Family history Family History  Problem Relation Age of Onset  . Hypertension Mother   . Stroke Mother   . Asthma Father   . Hypertension Father   . Hyperlipidemia Father   . Heart attack Father   . Diabetes Brother   . Sleep apnea Daughter   . Asthma Son   . Hypertension Son     Tobacco History  Substance Use Topics  . Smoking status: Never Smoker   . Smokeless tobacco: Never Used  . Alcohol Use: No   MEDICARE WELLNESS OBJECTIVES: Tobacco use: She does not smoke.  Patient is not a former smoker. If yes, counseling given Alcohol Current alcohol  use: none Osteoporosis: postmenopausal estrogen deficiency and dietary calcium and/or vitamin D deficiency, History of fracture in the past year: no Fall risk: Moderate Risk Hearing: normal Visual acuity: normal,  does perform annual eye exam Diet: in general, a "healthy" diet   Physical activity: Current Exercise Habits:: The patient does not participate in regular exercise at present Cardiac risk factors: Cardiac Risk Factors include: advanced age (>28men, >28 women);diabetes mellitus;dyslipidemia;hypertension;obesity (BMI >30kg/m2);sedentary lifestyle Depression/mood screen:   Depression screen [facility] 2/9 08/22/2014  Decreased Interest 0  Down, Depressed, Hopeless 0  PHQ - 2 Score 0    ADLs:  In your present state of health, do you have any difficulty performing the following activities: 08/22/2014 08/17/2014  Hearing? N -  Vision? N -  Difficulty concentrating or making decisions? N -  Walking or climbing stairs? Y -  Dressing or bathing? N -  Doing errands, shopping? Tempie Donning  Preparing Food and eating ? N -  Using the Toilet? N -  In the past six months, have you accidently leaked urine? N -  Do you have problems with loss of bowel control? N -  Managing your Medications? N -  Managing your Finances? N -  Housekeeping or managing your Housekeeping? Y -     Cognitive Testing  Alert? Yes  Normal Appearance?Yes  Oriented to person? Yes  Place? Yes   Time? Yes  Recall of three objects?  2/3  Can perform simple calculations? Yes  Displays appropriate judgment?Yes  Can read the correct time from a watch face?Yes  EOL planning: Does patient have an advance directive?: Yes Type of Advance Directive: [place], Living will Does patient want to make changes to advanced directive?: No - Patient declined Copy of advanced directive(s) in chart?: No - copy requested   Objective:   Blood pressure 138/70, pulse 64, temperature 98.6 F (37 C), resp. rate 18, height 5\' 4"   (1.626 m), weight 189 lb 6.4 oz (85.911 kg). Body mass index is 32.49 kg/(m^2).  General appearance: alert, no distress, WD/WN,  female HEENT: normocephalic, sclerae anicteric, TMs pearly, nares patent, no discharge or erythema, pharynx normal Oral cavity: MMM, no lesions Neck: supple, right neck with well  healing vertical scar with hard tender swelling from lower ear to base of neck, no pulsating, no weeping, no erythema, warmth, no lymphadenopathy, no thyromegaly, no masses Heart: RRR, normal S1, S2, no murmurs Lungs: CTA bilaterally, no wheezes, rhonchi, or rales Abdomen: +bs, soft, obese non tender, non distended, no masses, no hepatomegaly, no splenomegaly Musculoskeletal: nontender, no swelling, no obvious deformity Extremities: 1-2 + edema, no cyanosis, no clubbing Pulses: 2+ symmetric, upper and lower extremities, normal cap refill Neurological: alert, oriented x 3, CN2-12 intact, strength normal upper extremities and lower extremities, sensation decreased bilateral feet, DTRs 2+ throughout, + romberg, negative finger to nose, gait slow with cane Psychiatric: normal affect, behavior normal, pleasant  Breast:defer Gyn: defer  Rectal: defer   Medicare Attestation I have personally reviewed: The patient's medical and social history Their use of alcohol, tobacco or illicit drugs Their current medications and supplements The patient's functional ability including ADLs,fall risks, home safety risks, cognitive, and hearing and visual impairment Diet and physical activities Evidence for depression or mood disorders  The patient's weight, height, BMI, and visual acuity have been recorded in the chart.  I have made referrals, counseling, and provided education to the patient based on review of the above and I have provided the patient with a written personalized care plan for preventive services.     Vicie Mutters, PA-C   08/22/2014

## 2014-08-22 NOTE — Patient Instructions (Signed)

## 2014-08-23 DIAGNOSIS — Z0000 Encounter for general adult medical examination without abnormal findings: Secondary | ICD-10-CM | POA: Insufficient documentation

## 2014-08-23 DIAGNOSIS — Z9181 History of falling: Secondary | ICD-10-CM | POA: Insufficient documentation

## 2014-08-23 DIAGNOSIS — I519 Heart disease, unspecified: Secondary | ICD-10-CM | POA: Insufficient documentation

## 2014-08-25 ENCOUNTER — Telehealth: Payer: Self-pay

## 2014-08-25 ENCOUNTER — Encounter: Payer: Self-pay | Admitting: Surgery

## 2014-08-25 NOTE — Telephone Encounter (Signed)
Phone call from pt's. Daughter.  Reported her mother has swelling and an firmness in the right neck incision.  Also stated pt. C/o not feeling well today.  Reported the incision is tender.  Daughter connected pt. In a 3 way phone call for triage.  Pt. denied any redness, drainage, or separation of right neck incision.  Pt. Stated she thinks the swelling of the right neck has gone down a little since discharged from hospital.  Denied fever/ chills.  Stated she has a mild headache.  Also c/o earache in right ear.  Stated she saw her PCP on Monday, and was placed on Doxycycline for fluid in the ear.  Advised pt. to gently palpate right neck swelling, and check for pulsatile mass; pt. denied any pulsation when checked.  Reassured pt. that some swelling of the incisional area is not unusual in the healing process, and that the firmness may also be related to the healing ridge that develops about 5-9 days post-op.  Encouraged to continue to monitor for increased swelling, redness/ warmth, separation of incision, drainage, fever, or chills, and report to office.  Daughter and pt. verb. understanding.  Reminded of post-op appt. with Dr. Trula Slade. 08/29/14.

## 2014-08-29 ENCOUNTER — Ambulatory Visit (INDEPENDENT_AMBULATORY_CARE_PROVIDER_SITE_OTHER): Payer: Self-pay | Admitting: Surgery

## 2014-08-29 ENCOUNTER — Encounter: Payer: Self-pay | Admitting: Surgery

## 2014-08-29 VITALS — BP 160/83 | HR 56 | Ht 64.0 in | Wt 185.2 lb

## 2014-08-29 DIAGNOSIS — I6521 Occlusion and stenosis of right carotid artery: Secondary | ICD-10-CM

## 2014-08-29 NOTE — Progress Notes (Signed)
Patient name: [name] [name] MRN: [id number] DOB: [birth year] Sex: female     Chief Complaint  Patient presents with  . Routine Post Op    s/p Rt CEA - c/o Rt jaw pain when biting down w/numbness and a dull ache around forehead area since surgery    HISTORY OF PRESENT ILLNESS: The patient is back for her first postoperative follow-up.  On 08/18/2014 she underwent right carotid endarterectomy with bovine pericardial patch angioplasty for an 80% asymptomatic lesion.  The day prior, she had diagnostic angiography because of a high bifurcation.  This also showed fibromuscular changes within the distal internal carotid artery.  Patient's postoperative course was uncomplicated.  She did have a small hematoma at her incision site, which was not unexpected given that she takes Eliquis.,  She is back today with several complaints: She has occasional headaches.  She has some numbness around her jaw and her incision.  She bruises easily on her arms.  She has leg pain.  She has right ear pain.  She falls asleep frequently.  She denies swallowing difficulty.  She denies dysarthria or any other neurologic symptoms.  Past Medical History  Diagnosis Date  . Obesity   . Hyperlipidemia   . Type II or unspecified type diabetes mellitus without mention of complication, not stated as uncontrolled   . Allergy   . Arthritis   . Gout   . Vitamin D deficiency   . Carotid artery stenosis     1-39% right and 40-59% left by dopplers 2015  . Chronic diastolic CHF (congestive heart failure), NYHA class 1   . Hypertension   . Atrial fibrillation   . CKD (chronic kidney disease), stage III   . GERD (gastroesophageal reflux disease)   . Carotid stenosis   . Anxiety     mild    Past Surgical History  Procedure Laterality Date  . Joint replacement    . Dilitation and currage    . Vein surgery    . Cataract extraction    . Tee without cardioversion N/A 06/17/2014    Procedure: TRANSESOPHAGEAL  ECHOCARDIOGRAM (TEE);  Surgeon: Fay Records, MD;  Location: [facility] ENDOSCOPY;  Service: Cardiovascular;  Laterality: N/A;  . Cardioversion N/A 06/17/2014    Procedure: CARDIOVERSION;  Surgeon: Fay Records, MD;  Location: [place];  Service: Cardiovascular;  Laterality: N/A;  . Peripheral vascular catheterization N/A 08/17/2014    Procedure: Carotid Angiography;  Surgeon: Serafina Mitchell, MD;  Location: [place] CV LAB;  Service: Cardiovascular;  Laterality: N/A;  . Endarterectomy  08/17/2014  . Endarterectomy Right 08/18/2014    Procedure: ENDARTERECTOMY CAROTID;  Surgeon: Serafina Mitchell, MD;  Location: [facility] OR;  Service: Vascular;  Laterality: Right;  . Patch angioplasty Right 08/18/2014    Procedure: PATCH ANGIOPLASTY USING XENOSURE BIOLOGIC PATCH 1CM X 6CM;  Surgeon: Serafina Mitchell, MD;  Location: [place];  Service: Vascular;  Laterality: Right;    History   Social History  . Marital Status: Widowed    Spouse Name: N/A  . Number of Children: N/A  . Years of Education: N/A   Occupational History  . Not on file.   Social History Main Topics  . Smoking status: Never Smoker   . Smokeless tobacco: Never Used  . Alcohol Use: No  . Drug Use: No  . Sexual Activity: No   Other Topics Concern  . Not on file   Social History Narrative    Family History  Problem Relation Age of Onset  . Hypertension Mother   . Stroke Mother   . Asthma Father   . Hypertension Father   . Hyperlipidemia Father   . Heart attack Father   . Diabetes Brother   . Sleep apnea Daughter   . Asthma Son   . Hypertension Son     Allergies as of 08/29/2014 - Review Complete 08/29/2014  Allergen Reaction Noted  . Codeine Other (See Comments) 12/25/2011  . Lisinopril Other (See Comments) 02/02/2013  . Tylenol pm extra [diphenhydramine-apap (sleep)]  05/09/2012    Current Outpatient Prescriptions on File Prior to Visit  Medication Sig Dispense Refill  . allopurinol (ZYLOPRIM) 300 MG tablet Take 150 mg  by mouth at bedtime.    . ALPRAZolam (XANAX) 1 MG tablet TAKE 1/2 TO 1 TABLET EVERY DAY AT BEDTIME AS NEEDED FOR SLEEP 30 tablet 3  . cholecalciferol (VITAMIN D) 1000 UNITS tablet Take 4,000 Units by mouth every morning.     . diltiazem (CARDIZEM CD) 240 MG 24 hr capsule TAKE 1 TABLET BY MOUTH EVERY DAY 60 capsule 6  . diltiazem (CARDIZEM LA) 240 MG 24 hr tablet Take 1 tablet (240 mg total) by mouth daily. 30 tablet 6  . doxycycline (VIBRAMYCIN) 100 MG capsule take 1 capsule  2 x day on a full stomach for infection for 10 days 20 capsule 0  . ELIQUIS 2.5 MG TABS tablet TAKE 1 TABLET BY MOUTH TWICE A DAY 60 tablet 5  . Flaxseed, Linseed, (FLAX SEED OIL) 1000 MG CAPS Take 1 capsule by mouth every morning.    . furosemide (LASIX) 40 MG tablet Take 20 mg by mouth daily.  6  . gabapentin (NEURONTIN) 300 MG capsule TAKE ONE CAPSULE BY MOUTH EVERY DAY AT BEDTIME 90 capsule 4  . gemfibrozil (LOPID) 600 MG tablet TAKE 1 TABLET BY MOUTH TWICE DAILY WITH MEALS FOR CHOLESTEROL 180 tablet 0  . Lancets (FREESTYLE) lancets Check blood glucose 1 time daily.  DX-E11.22 100 each 6  . losartan (COZAAR) 25 MG tablet Take 25 mg by mouth daily.  11  . magnesium oxide (MAG-OX) 400 MG tablet Take 400 mg by mouth daily.    . metoprolol (LOPRESSOR) 50 MG tablet Take 1 tablet (50 mg total) by mouth 2 (two) times daily. 60 tablet 6  . neomycin-polymyxin-hydrocortisone (CORTISPORIN) 3.5-10000-1 otic suspension Place 1-2 drops into the right ear 4 (four) times daily.  0  . omeprazole (PRILOSEC) 20 MG capsule TAKE ONE CAPSULE EVERY MORNING FOR ACID 90 capsule 1  . oxyCODONE (ROXICODONE) 5 MG immediate release tablet Take 1 tablet (5 mg total) by mouth every 6 (six) hours as needed. 20 tablet 0  . ranitidine (ZANTAC) 300 MG tablet Take 1 tablet (300 mg total) by mouth as needed for heartburn. 60 tablet 3  . vitamin B-12 (CYANOCOBALAMIN) 1000 MCG tablet Take 1,000 mcg by mouth every morning.    . vitamin C (ASCORBIC ACID) 500 MG  tablet Take 500 mg by mouth 2 (two) times daily.      No current facility-administered medications on file prior to visit.     REVIEW OF SYSTEMS: See history of present illness   PHYSICAL EXAMINATION:   Vital signs are  Filed Vitals:   08/29/14 0840 08/29/14 0843  BP: 175/76 160/83  Pulse: 54 56  Height: 5\' 4"  (1.626 m)   Weight: 185 lb 3.2 oz (84.006 kg)   SpO2: 97%    Body mass index is 31.77 kg/(m^2). General:  The patient appears their stated age. HEENT:  Incision is healing nicely.  Hematoma is resolving. Pulmonary:  Non labored breathing Musculoskeletal: There are no major deformities. Neurologic: No focal weakness or paresthesias are detected, Skin: There are no ulcer or rashes noted. Psychiatric: The patient has normal affect. Cardiovascular: There is a regular rate and rhythm without significant murmur appreciated.   Diagnostic Studies None  Assessment: Status post right carotid endarterectomy Plan: Overall, the patient is doing very well.  She has multiple complaints which I think are all secondary to her general anesthesia neck and operative repair.  I suspect all of these will resolve with time.  I have scheduled her to follow-up with me in 3 months.  Eldridge Abrahams, M.D. [facility] Office: [phone] Pager:  [phone]

## 2014-09-14 ENCOUNTER — Ambulatory Visit (INDEPENDENT_AMBULATORY_CARE_PROVIDER_SITE_OTHER): Payer: Medicare Other | Admitting: Internal Medicine

## 2014-09-14 ENCOUNTER — Encounter: Payer: Self-pay | Admitting: Internal Medicine

## 2014-09-14 VITALS — BP 134/72 | HR 60 | Temp 97.3°F | Resp 16 | Ht 63.5 in | Wt 184.2 lb

## 2014-09-14 DIAGNOSIS — I10 Essential (primary) hypertension: Secondary | ICD-10-CM

## 2014-09-14 DIAGNOSIS — E1122 Type 2 diabetes mellitus with diabetic chronic kidney disease: Secondary | ICD-10-CM

## 2014-09-14 DIAGNOSIS — N189 Chronic kidney disease, unspecified: Secondary | ICD-10-CM | POA: Diagnosis not present

## 2014-09-14 DIAGNOSIS — E782 Mixed hyperlipidemia: Secondary | ICD-10-CM | POA: Diagnosis not present

## 2014-09-14 DIAGNOSIS — E559 Vitamin D deficiency, unspecified: Secondary | ICD-10-CM

## 2014-09-14 DIAGNOSIS — Z79899 Other long term (current) drug therapy: Secondary | ICD-10-CM | POA: Diagnosis not present

## 2014-09-14 DIAGNOSIS — I4891 Unspecified atrial fibrillation: Secondary | ICD-10-CM

## 2014-09-14 DIAGNOSIS — Z6832 Body mass index (BMI) 32.0-32.9, adult: Secondary | ICD-10-CM | POA: Diagnosis not present

## 2014-09-14 DIAGNOSIS — E1129 Type 2 diabetes mellitus with other diabetic kidney complication: Secondary | ICD-10-CM | POA: Diagnosis not present

## 2014-09-14 DIAGNOSIS — I6521 Occlusion and stenosis of right carotid artery: Secondary | ICD-10-CM

## 2014-09-14 LAB — CBC WITH DIFFERENTIAL/PLATELET
BASOS PCT: 1 % (ref 0–1)
Basophils Absolute: 0.1 10*3/uL (ref 0.0–0.1)
EOS ABS: 0.5 10*3/uL (ref 0.0–0.7)
EOS PCT: 6 % — AB (ref 0–5)
HCT: 37 % (ref 36.0–46.0)
Hemoglobin: 12.5 g/dL (ref 12.0–15.0)
Lymphocytes Relative: 21 % (ref 12–46)
Lymphs Abs: 1.6 10*3/uL (ref 0.7–4.0)
MCH: 30.6 pg (ref 26.0–34.0)
MCHC: 33.8 g/dL (ref 30.0–36.0)
MCV: 90.7 fL (ref 78.0–100.0)
MPV: 12.1 fL (ref 8.6–12.4)
Monocytes Absolute: 0.6 10*3/uL (ref 0.1–1.0)
Monocytes Relative: 8 % (ref 3–12)
NEUTROS ABS: 5 10*3/uL (ref 1.7–7.7)
Neutrophils Relative %: 64 % (ref 43–77)
PLATELETS: 210 10*3/uL (ref 150–400)
RBC: 4.08 MIL/uL (ref 3.87–5.11)
RDW: 14.1 % (ref 11.5–15.5)
WBC: 7.8 10*3/uL (ref 4.0–10.5)

## 2014-09-14 LAB — HEPATIC FUNCTION PANEL
ALBUMIN: 4.5 g/dL (ref 3.6–5.1)
ALK PHOS: 109 U/L (ref 33–130)
ALT: 10 U/L (ref 6–29)
AST: 19 U/L (ref 10–35)
BILIRUBIN TOTAL: 0.5 mg/dL (ref 0.2–1.2)
Bilirubin, Direct: 0.1 mg/dL (ref <=0.2)
Indirect Bilirubin: 0.4 mg/dL (ref 0.2–1.2)
Total Protein: 7.2 g/dL (ref 6.1–8.1)

## 2014-09-14 LAB — BASIC METABOLIC PANEL WITH GFR
BUN: 41 mg/dL — ABNORMAL HIGH (ref 7–25)
CALCIUM: 10.1 mg/dL (ref 8.6–10.4)
CHLORIDE: 101 mmol/L (ref 98–110)
CO2: 24 mmol/L (ref 20–31)
Creat: 1.52 mg/dL — ABNORMAL HIGH (ref 0.60–0.88)
GFR, Est African American: 37 mL/min — ABNORMAL LOW (ref >=60)
GFR, Est Non African American: 32 mL/min — ABNORMAL LOW (ref >=60)
Glucose, Bld: 114 mg/dL — ABNORMAL HIGH (ref 65–99)
POTASSIUM: 4.5 mmol/L (ref 3.5–5.3)
Sodium: 137 mmol/L (ref 135–146)

## 2014-09-14 LAB — MAGNESIUM: Magnesium: 2.3 mg/dL (ref 1.5–2.5)

## 2014-09-14 LAB — TSH: TSH: 1.127 u[IU]/mL (ref 0.350–4.500)

## 2014-09-14 LAB — HEMOGLOBIN A1C
HEMOGLOBIN A1C: 6.3 % — AB (ref <5.7)
Mean Plasma Glucose: 134 mg/dL — ABNORMAL HIGH (ref <117)

## 2014-09-14 NOTE — Progress Notes (Signed)
Patient ID: [name] [name], female   DOB: Jun 12, 1930, 81 y.o.   MRN: [id number]   This very nice 81 y.o. [facility] presents for 3 month follow up with Hypertension, ASHD/chAfib, Hyperlipidemia, T2-NIDDM w/ CKD and Vitamin D Deficiency. On August 18, 2014, patient had an uncomplicated Right CE/IOL implant.    Patient is treated for HTN & BP has been controlled at home. Today's BP: 134/72 mmHg. Patient has had no complaints of any cardiac type chest pain, palpitations, dyspnea/orthopnea/PND, dizziness, claudication, or dependent edema. In April Patient was hospitalized with Rapid Afib and started on Eliquis. CV on May 13 failed.    Hyperlipidemia is controlled with diet & meds. Patient denies myalgias or other med SE's. Last Lipids were at goal - Cholesterol 136; HDL 56.50; LDL 64; Triglycerides 80 on  06/20/2014.   Also, the patient has history of T2_NIDDM predating from 2003 w/CKD3 w/ GFR 37 ml/min.  She has attempted dietary management. She has had no symptoms of reactive hypoglycemia, diabetic polys, paresthesias or visual blurring.  Last A1c was 6.3% on 02/23/2014.   Further, the patient also has history of Vitamin D Deficiency of "38" in 2010 and supplements vitamin D without any suspected side-effects. Last vitamin D was  52 on 02/23/2014.  Medication Sig  . allopurinol300 MG tablet Take 150 mg by mouth at bedtime.  . ALPRAZolam  1 MG tablet TAKE 1/2 TO 1 TABLET EVERY DAY AT BEDTIME AS NEEDED FOR SLEEP  . VITAMIN D 1000 UNITS tablet Take 4,000 Units by mouth every morning.   Marland Kitchen ELIQUIS 2.5 MG T TAKE 1 TABLET BY MOUTH TWICE A DAY  . FLAX SEED OIL 1000 MG CAPS Take 1 capsule by mouth every morning.  . furosemide  40 MG tablet Take 20 mg by mouth daily.  Marland Kitchen gabapentin  300 MG capsule TAKE ONE CAPSULE BY MOUTH EVERY DAY AT BEDTIME  . gemfibrozil  600 MG tablet TAKE 1 TABLET BY MOUTH TWICE DAILY WITH MEALS FOR CHOLESTEROL  . losartan (COZAAR) 25 MG tablet Take 25 mg by mouth daily.  . magnesium oxide (MAG-OX)  400 MG tablet Take 400 mg by mouth daily.  . metoprolol (LOPRESSOR) 50 MG tablet Take 1 tablet (50 mg total) by mouth 2 (two) times daily.  . CORTISPORIN otic susp Place 1-2 drops into the right ear 4 (four) times daily.  Marland Kitchen omeprazole  20 MG capsule TAKE ONE CAPSULE EVERY MORNING FOR ACID  . ranitidine  300 MG tablet Take 1 tablet (300 mg total) by mouth as needed for heartburn.  . vitamin B-12 1000 MCG tablet Take 1,000 mcg by mouth every morning.  . vitamin C 500 MG tablet Take 500 mg by mouth 2 (two) times daily.   Marland Kitchen diltiazem  LA) 240 MG 24 hr tablet Take 1 tablet (240 mg total) by mouth daily.  Marland Kitchen oxyCODONE (ROXICODONE) 5 MG immediate release tablet Take 1 tablet (5 mg total) by mouth every 6 (six) hours as needed.   Allergies  Allergen Reactions  . Codeine Other (See Comments)    "felt like things were crawling on me"  . Lisinopril Other (See Comments)    fatigue  . Tylenol Pm Extra [Diphenhydramine-Apap (Sleep)]    PMHx:   Past Medical History  Diagnosis Date  . Obesity   . Hyperlipidemia   . Type II or unspecified type diabetes mellitus without mention of complication, not stated as uncontrolled   . Allergy   . Arthritis   . Gout   .  Vitamin D deficiency   . Carotid artery stenosis     1-39% right and 40-59% left by dopplers 2015  . Chronic diastolic CHF (congestive heart failure), NYHA class 1   . Hypertension   . Atrial fibrillation   . CKD (chronic kidney disease), stage III   . GERD (gastroesophageal reflux disease)   . Carotid stenosis   . Anxiety     mild   Immunization History  Administered Date(s) Administered  . Influenza Split 10/23/2012  . Influenza, High Dose Seasonal PF 11/11/2013  . Pneumococcal Conjugate-13 11/11/2013  . Pneumococcal Polysaccharide-23 02/04/2006  . Td 12/21/2008   Past Surgical History  Procedure Laterality Date  . Joint replacement    . Dilitation and currage    . Vein surgery    . Cataract extraction    . Tee without  cardioversion N/A 06/17/2014    Procedure: TRANSESOPHAGEAL ECHOCARDIOGRAM (TEE);  Surgeon: Fay Records, MD;  Location: [facility] ENDOSCOPY;  Service: Cardiovascular;  Laterality: N/A;  . Cardioversion N/A 06/17/2014    Procedure: CARDIOVERSION;  Surgeon: Fay Records, MD;  Location: [place];  Service: Cardiovascular;  Laterality: N/A;  . Peripheral vascular catheterization N/A 08/17/2014    Procedure: Carotid Angiography;  Surgeon: Serafina Mitchell, MD;  Location: [place] CV LAB;  Service: Cardiovascular;  Laterality: N/A;  . Endarterectomy  08/17/2014  . Endarterectomy Right 08/18/2014    Procedure: ENDARTERECTOMY CAROTID;  Surgeon: Serafina Mitchell, MD;  Location: [facility] OR;  Service: Vascular;  Laterality: Right;  . Patch angioplasty Right 08/18/2014    Procedure: PATCH ANGIOPLASTY USING XENOSURE BIOLOGIC PATCH 1CM X 6CM;  Surgeon: Serafina Mitchell, MD;  Location: [facility] OR;  Service: Vascular;  Laterality: Right;   FHx:    Reviewed / unchanged  SHx:    Reviewed / unchanged  Systems Review:  Constitutional: Denies fever, chills, wt changes, headaches, insomnia, fatigue, night sweats, change in appetite. Eyes: Denies redness, blurred vision, diplopia, discharge, itchy, watery eyes.  ENT: Denies discharge, congestion, post nasal drip, epistaxis, sore throat, earache, hearing loss, dental pain, tinnitus, vertigo, sinus pain, snoring.  CV: Denies chest pain, palpitations, irregular heartbeat, syncope, dyspnea, diaphoresis, orthopnea, PND, claudication or edema. Respiratory: denies cough, dyspnea, DOE, pleurisy, hoarseness, laryngitis, wheezing.  Gastrointestinal: Denies dysphagia, odynophagia, heartburn, reflux, water brash, abdominal pain or cramps, nausea, vomiting, bloating, diarrhea, constipation, hematemesis, melena, hematochezia  or hemorrhoids. Genitourinary: Denies dysuria, frequency, urgency, nocturia, hesitancy, discharge, hematuria or flank pain. Musculoskeletal: Denies arthralgias, myalgias,  stiffness, jt. swelling, pain, limping or strain/sprain.  Skin: Denies pruritus, rash, hives, warts, acne, eczema or change in skin lesion(s). Neuro: No weakness, tremor, incoordination, spasms, paresthesia or pain. Psychiatric: Denies confusion, memory loss or sensory loss. Endo: Denies change in weight, skin or hair change.  Heme/Lymph: No excessive bleeding, bruising or enlarged lymph nodes.  Physical Exam  BP 134/72 Pulse 60  Temp 97.3 F   Resp 16  Ht 5' 3.5"   Wt 184 lb 3.2 oz     BMI 32.11  Appears well nourished and in no distress. Eyes: PERRLA, EOMs, conjunctiva no swelling or erythema. Sinuses: No frontal/maxillary tenderness ENT/Mouth: EAC's clear, TM's nl w/o erythema, bulging. Nares clear w/o erythema, swelling, exudates. Oropharynx clear without erythema or exudates. Oral hygiene is good. Tongue normal, non obstructing. Hearing intact.  Neck: Supple. Thyroid nl. Car 2+/2+ without bruits, nodes or JVD. Chest: Respirations nl with BS clear & equal w/o rales, rhonchi, wheezing or stridor.  Cor: Heart sounds soft w/slightly irregular  rate and rhythm without sig. murmurs, gallops, clicks, or rubs. Peripheral pulses normal and equal  without edema.  Abdomen: Soft & bowel sounds normal. Non-tender w/o guarding, rebound, hernias, masses, or organomegaly.  Lymphatics: Unremarkable.  Musculoskeletal: Full ROM all peripheral extremities, joint stability, 5/5 strength, and normal gait.  Skin: Warm, dry without exposed rashes, lesions or ecchymosis apparent.  Neuro: Cranial nerves intact, reflexes equal bilaterally. Sensory-motor testing grossly intact. Tendon reflexes grossly intact.  Pysch: Alert & oriented x 3.  Insight and judgement nl & appropriate. No ideations.  Assessment and Plan:  1. Essential hypertension  - TSH  2. Mixed hyperlipidemia   3. Type 2 diabetes mellitus with diabetic chronic kidney disease  - Hemoglobin A1c - Insulin, random  4. Vitamin D  deficiency  - Vit D  25 hydroxy    5. Controlled atrial fibrillation   6. BMI 32.0-32.9,adult   7. Medication management  - CBC with Differential/Platelet - BASIC METABOLIC PANEL WITH GFR - Hepatic function panel - Magnesium   Recommended regular exercise, BP monitoring, encouraged  weight closs and discussed med and SE's. Recommended labs to assess and monitor clinical status. Further disposition pending results of labs. Over 30 minutes of exam, counseling, chart review was performed

## 2014-09-14 NOTE — Patient Instructions (Signed)

## 2014-09-15 ENCOUNTER — Other Ambulatory Visit: Payer: Self-pay | Admitting: Internal Medicine

## 2014-09-15 DIAGNOSIS — N289 Disorder of kidney and ureter, unspecified: Secondary | ICD-10-CM

## 2014-09-15 LAB — VITAMIN D 25 HYDROXY (VIT D DEFICIENCY, FRACTURES): Vit D, 25-Hydroxy: 53 ng/mL (ref 30–100)

## 2014-09-15 LAB — INSULIN, RANDOM: Insulin: 7.1 u[IU]/mL (ref 2.0–19.6)

## 2014-09-19 ENCOUNTER — Telehealth: Payer: Self-pay | Admitting: Cardiology

## 2014-09-19 DIAGNOSIS — Z1212 Encounter for screening for malignant neoplasm of rectum: Secondary | ICD-10-CM | POA: Diagnosis not present

## 2014-09-19 DIAGNOSIS — Z1211 Encounter for screening for malignant neoplasm of colon: Secondary | ICD-10-CM | POA: Diagnosis not present

## 2014-09-19 NOTE — Telephone Encounter (Signed)
Pt c/o BP issue: STAT if pt c/o blurred vision, one-sided weakness or slurred speech  1. What are your last 5 BP readings?    8/13 144/80    8/14   150/80      8/15 170/80  2. Are you having any other symptoms (ex. Dizziness, headache, blurred vision, passed out)?  Pt states she feels shaky  3. What is your BP issue? Its high for her

## 2014-09-19 NOTE — Telephone Encounter (Signed)
Spoke with daughter and patient  Blood pressure readings are prior to morning medications Had patient recheck her blood pressure and is was down to 127/69 HR 61 and 136/75 heart rate 61 Daily heart rate ranging from 59-64 and blood sugars 102-112 Yesterday patient was feeling "shaky"  but stated she did not sleep well the night before Patient will start to monitor blood pressure after taking her blood pressure medications or if she is not feeling well. She will call back if they continue to be elevated  Patient stated she was feeling better today

## 2014-09-20 ENCOUNTER — Other Ambulatory Visit: Payer: Self-pay | Admitting: Internal Medicine

## 2014-09-26 ENCOUNTER — Ambulatory Visit (INDEPENDENT_AMBULATORY_CARE_PROVIDER_SITE_OTHER): Payer: Medicare Other | Admitting: *Deleted

## 2014-09-26 DIAGNOSIS — N289 Disorder of kidney and ureter, unspecified: Secondary | ICD-10-CM | POA: Diagnosis not present

## 2014-09-26 LAB — BASIC METABOLIC PANEL WITH GFR
BUN: 49 mg/dL — AB (ref 7–25)
CO2: 22 mmol/L (ref 20–31)
Calcium: 9.5 mg/dL (ref 8.6–10.4)
Chloride: 102 mmol/L (ref 98–110)
Creat: 1.42 mg/dL — ABNORMAL HIGH (ref 0.60–0.88)
GFR, EST AFRICAN AMERICAN: 40 mL/min — AB (ref >=60)
GFR, EST NON AFRICAN AMERICAN: 34 mL/min — AB (ref >=60)
GLUCOSE: 111 mg/dL — AB (ref 65–99)
Potassium: 4.5 mmol/L (ref 3.5–5.3)
Sodium: 137 mmol/L (ref 135–146)

## 2014-09-26 NOTE — Progress Notes (Signed)
Patient ID: [name] [name], female   DOB: 1930-06-14, 81 y.o.   MRN: [id number] Patient here to recheck a BMET.  Patient states she has increased her urine intake.

## 2014-09-27 LAB — COLOGUARD

## 2014-09-30 ENCOUNTER — Other Ambulatory Visit: Payer: Self-pay | Admitting: Internal Medicine

## 2014-10-13 ENCOUNTER — Telehealth: Payer: Self-pay | Admitting: Physician Assistant

## 2014-10-13 NOTE — Telephone Encounter (Signed)
81 y/o WF, last colonoscopy 2006 with polyps removed, had done outside of [place], had positive cologuard test recently, wantsto wait to discuss further at November visit. Declines referral at this time.

## 2014-10-24 ENCOUNTER — Ambulatory Visit (INDEPENDENT_AMBULATORY_CARE_PROVIDER_SITE_OTHER): Payer: Medicare Other | Admitting: Cardiology

## 2014-10-24 ENCOUNTER — Encounter: Payer: Self-pay | Admitting: Cardiology

## 2014-10-24 VITALS — BP 130/70 | HR 65 | Ht 64.0 in | Wt 187.4 lb

## 2014-10-24 DIAGNOSIS — I480 Paroxysmal atrial fibrillation: Secondary | ICD-10-CM

## 2014-10-24 DIAGNOSIS — I10 Essential (primary) hypertension: Secondary | ICD-10-CM

## 2014-10-24 DIAGNOSIS — I6521 Occlusion and stenosis of right carotid artery: Secondary | ICD-10-CM

## 2014-10-24 DIAGNOSIS — I5032 Chronic diastolic (congestive) heart failure: Secondary | ICD-10-CM

## 2014-10-24 NOTE — Progress Notes (Signed)
Cardiology Office Note   Date:  10/25/2014   ID:  [name] [name], DOB 07/23/1930, MRN [id number]  PCP:  Alesia Richards, MD    Chief Complaint  Patient presents with  . Atrial Fibrillation      History of Present Illness: [name] [name] is a 81 y.o. female with a hx of PAFib s/p TEE/DCCV, HTN, dyslipidemia, obesity, DM, mild pulmonary HTN, moderate TR and chronic diastolic CHF and moderate left carotid artery stenosis and severe RICA stenosis s/p CEA.  She returns for follow-up. She's doing well. She has more energy. She has not had any short of breath. She has infrequent vague chest pains under her left breast with normal stress test earlier in the year.  She denies orthopnea, PND or significant edema. She denies syncope or dizziness.   Past Medical History  Diagnosis Date  . Obesity   . Hyperlipidemia   . Type II or unspecified type diabetes mellitus without mention of complication, not stated as uncontrolled   . Allergy   . Arthritis   . Gout   . Vitamin D deficiency   . Chronic diastolic CHF (congestive heart failure), NYHA class 1   . Hypertension   . Atrial fibrillation   . CKD (chronic kidney disease), stage III   . GERD (gastroesophageal reflux disease)   . Anxiety     mild  . Carotid artery stenosis     underwent right carotid endarterectomy with bovine pericardial patch angioplasty for an 80% lesion and 40-59% left carotid stenosis    Past Surgical History  Procedure Laterality Date  . Joint replacement    . Dilitation and currage    . Vein surgery    . Cataract extraction    . Tee without cardioversion N/A 06/17/2014    Procedure: TRANSESOPHAGEAL ECHOCARDIOGRAM (TEE);  Surgeon: Fay Records, MD;  Location: [facility] ENDOSCOPY;  Service: Cardiovascular;  Laterality: N/A;  . Cardioversion N/A 06/17/2014    Procedure: CARDIOVERSION;  Surgeon: Fay Records, MD;  Location: [place];  Service: Cardiovascular;  Laterality: N/A;  .  Peripheral vascular catheterization N/A 08/17/2014    Procedure: Carotid Angiography;  Surgeon: Serafina Mitchell, MD;  Location: [place] [facility];  Service: Cardiovascular;  Laterality: N/A;  . Endarterectomy  08/17/2014  . Endarterectomy Right 08/18/2014    Procedure: ENDARTERECTOMY CAROTID;  Surgeon: Serafina Mitchell, MD;  Location: [facility] OR;  Service: Vascular;  Laterality: Right;  . Patch angioplasty Right 08/18/2014    Procedure: PATCH ANGIOPLASTY USING XENOSURE BIOLOGIC PATCH 1CM X 6CM;  Surgeon: Serafina Mitchell, MD;  Location: [facility] OR;  Service: Vascular;  Laterality: Right;     Current Outpatient Prescriptions  Medication Sig Dispense Refill  . allopurinol (ZYLOPRIM) 300 MG tablet Take 150 mg by mouth at bedtime.    . ALPRAZolam (XANAX) 1 MG tablet TAKE 1/2 TO 1 TABLET EVERY DAY AT BEDTIME AS NEEDED FOR SLEEP 30 tablet 3  . cholecalciferol (VITAMIN D) 1000 UNITS tablet Take 4,000 Units by mouth every morning.     Marland Kitchen ELIQUIS 2.5 MG TABS tablet TAKE 1 TABLET BY MOUTH TWICE A DAY 60 tablet 5  . Flaxseed, Linseed, (FLAX SEED OIL) 1000 MG CAPS Take 1 capsule by mouth every morning.    . furosemide (LASIX) 40 MG tablet Take 20 mg by mouth daily.  6  . gabapentin (NEURONTIN) 300 MG capsule TAKE ONE CAPSULE BY  MOUTH EVERY DAY AT BEDTIME 90 capsule 4  . gemfibrozil (LOPID) 600 MG tablet TAKE 1 TABLET BY MOUTH TWICE A DAY WITH FOOD FOR CHOLESTEROL 180 tablet 0  . Lancets (FREESTYLE) lancets Check blood glucose 1 time daily.  [id number] 100 each 6  . losartan (COZAAR) 25 MG tablet Take 25 mg by mouth daily.  11  . magnesium oxide (MAG-OX) 400 MG tablet Take 400 mg by mouth daily.    . metoprolol (LOPRESSOR) 50 MG tablet Take 1 tablet (50 mg total) by mouth 2 (two) times daily. 60 tablet 6  . omeprazole (PRILOSEC) 20 MG capsule TAKE ONE CAPSULE EVERY MORNING FOR ACID 90 capsule 1  . ranitidine (ZANTAC) 300 MG tablet Take 1 tablet (300 mg total) by mouth as needed for heartburn. 60 tablet 3  . vitamin  B-12 (CYANOCOBALAMIN) 1000 MCG tablet Take 1,000 mcg by mouth every morning.    . vitamin C (ASCORBIC ACID) 500 MG tablet Take 500 mg by mouth 2 (two) times daily.      No current facility-administered medications for this visit.    Allergies:   Codeine; Lisinopril; and Tylenol pm extra    Social History:  The patient  reports that she has never smoked. She has never used smokeless tobacco. She reports that she does not drink alcohol or use illicit drugs.   Family History:  The patient's family history includes Asthma in her father and son; Diabetes in her brother; Heart attack in her father; Hyperlipidemia in her father; Hypertension in her father, mother, and son; Sleep apnea in her daughter; Stroke in her mother.    ROS:  Please see the history of present illness.   Otherwise, review of systems are positive for none.   All other systems are reviewed and negative.    PHYSICAL EXAM: VS:  BP 130/70 mmHg  Pulse 65  Ht 5\' 4"  (1.626 m)  Wt 187 lb 6.4 oz (85.004 kg)  BMI 32.15 kg/m2 , BMI Body mass index is 32.15 kg/(m^2). GEN: Well nourished, well developed, in no acute distress HEENT: normal Neck: no JVD, carotid bruits, or masses Cardiac: RRR; no murmurs, rubs, or gallops,no edema  Respiratory:  clear to auscultation bilaterally, normal work of breathing GI: soft, nontender, nondistended, + BS MS: no deformity or atrophy Skin: warm and dry, no rash Neuro:  Strength and sensation are intact Psych: euthymic mood, full affect   EKG:  EKG is not ordered today.    Recent Labs: 06/02/2014: B Natriuretic Peptide 520.6* 06/09/2014: Pro B Natriuretic peptide (BNP) 233.0* 09/14/2014: ALT 10; Hemoglobin 12.5; Magnesium 2.3; Platelets 210; TSH 1.127 09/26/2014: BUN 49*; Creat 1.42*; Potassium 4.5; Sodium 137    Lipid Panel    Component Value Date/Time   CHOL 136 06/20/2014 0949   TRIG 80.0 06/20/2014 0949   HDL 56.50 06/20/2014 0949   CHOLHDL 2 06/20/2014 0949   VLDL 16.0  06/20/2014 0949   LDLCALC 64 06/20/2014 0949   LDLDIRECT 100.8 02/14/2014 0930      Wt Readings from Last 3 Encounters:  10/24/14 187 lb 6.4 oz (85.004 kg)  09/14/14 184 lb 3.2 oz (83.553 kg)  08/29/14 185 lb 3.2 oz (84.006 kg)    ASSESSMENT AND PLAN:  Paroxysmal atrial fibrillation: Maintaining NSR. Continue Eliquis, metoprolol.  Her PCP put her back on ASA and I instructed her to stop this to avoid increased risk of bleeding.    Chronic diastolic CHF (congestive heart failure), NYHA class 1: Volume appears stable. Continue current  dose of Lasix.  Continue BB and ARB  Essential hypertension: Controlled on BB/ARB  Mixed hyperlipidemia: lipid 5/16 panel optimal.  Carotid artery stenosis, bilateral: Follow-up with vascular surgery is planned.  CKD stage 3 due to type 2 diabetes mellitus: Obtain BMET today.   Current medicines are reviewed at length with the patient today.  The patient does not have concerns regarding medicines.  The following changes have been made:  no change  Labs/ tests ordered today: See above Assessment and Plan No orders of the defined types were placed in this encounter.     Disposition:   FU with me in 6 months  Signed, Sueanne Margarita, MD  10/25/2014 8:27 AM    [facility] [address], [place], [place]  [address] Phone: [phone]; Fax: [phone]

## 2014-10-24 NOTE — Patient Instructions (Signed)

## 2014-10-25 ENCOUNTER — Encounter: Payer: Self-pay | Admitting: Cardiology

## 2014-10-26 ENCOUNTER — Telehealth: Payer: Self-pay | Admitting: Cardiology

## 2014-10-27 ENCOUNTER — Other Ambulatory Visit: Payer: Self-pay | Admitting: Cardiology

## 2014-10-28 DIAGNOSIS — E118 Type 2 diabetes mellitus with unspecified complications: Secondary | ICD-10-CM | POA: Diagnosis not present

## 2014-10-28 DIAGNOSIS — N390 Urinary tract infection, site not specified: Secondary | ICD-10-CM | POA: Diagnosis not present

## 2014-10-28 DIAGNOSIS — N183 Chronic kidney disease, stage 3 (moderate): Secondary | ICD-10-CM | POA: Diagnosis not present

## 2014-10-28 DIAGNOSIS — I10 Essential (primary) hypertension: Secondary | ICD-10-CM | POA: Diagnosis not present

## 2014-10-28 NOTE — Telephone Encounter (Signed)
Should the patient still be taking this? At her last office visit, it was under the discontinued medication list with a reason of change in therapy. Just wanted clarify. Please advise. Thanks, MI

## 2014-10-30 NOTE — Telephone Encounter (Signed)
I have no documentation that diltizem was stopped - please find out if patient is taking

## 2014-11-17 ENCOUNTER — Encounter: Payer: Self-pay | Admitting: Internal Medicine

## 2014-11-21 ENCOUNTER — Ambulatory Visit (INDEPENDENT_AMBULATORY_CARE_PROVIDER_SITE_OTHER): Payer: Medicare Other | Admitting: *Deleted

## 2014-11-21 DIAGNOSIS — Z23 Encounter for immunization: Secondary | ICD-10-CM | POA: Diagnosis not present

## 2014-11-22 NOTE — Telephone Encounter (Signed)
error 

## 2014-11-23 ENCOUNTER — Encounter: Payer: Self-pay | Admitting: Surgery

## 2014-11-28 ENCOUNTER — Ambulatory Visit (INDEPENDENT_AMBULATORY_CARE_PROVIDER_SITE_OTHER): Payer: Medicare Other | Admitting: Surgery

## 2014-11-28 ENCOUNTER — Encounter: Payer: Self-pay | Admitting: Surgery

## 2014-11-28 VITALS — BP 147/72 | HR 58 | Temp 97.7°F | Resp 14 | Ht 64.0 in | Wt 186.0 lb

## 2014-11-28 DIAGNOSIS — I6521 Occlusion and stenosis of right carotid artery: Secondary | ICD-10-CM | POA: Diagnosis not present

## 2014-11-28 NOTE — Addendum Note (Signed)
Addended by: Dorthula Rue L on: 11/28/2014 12:43 PM   Modules accepted: Orders

## 2014-11-28 NOTE — Progress Notes (Signed)
Patient name: [name] [name] MRN: [id number] DOB: 1930/07/01 Sex: female     Chief Complaint  Patient presents with  . Re-evaluation    3 mo Carotid Stenosis, Right  f/u   S/P   Right CEA  08-18-14    HISTORY OF PRESENT ILLNESS: The patient is back for follow-up.  On 08/18/2014 she underwent right carotid endarterectomy with bovine pericardial patch angioplasty.  She had a 80% asymptomatic lesion.  She did undergo diagnostic angiography the day prior to her operation because of the high bifurcation.  This also showed fibromuscular changes in her distal internal carotid artery.  The patient had an uncomplicated postoperative course.  At her first postoperative visit she had multiple complaints including occasional headaches, numbness around her incision and her jaw, easy bruising, leg pain, ear pain and fatigue.  She is back today for follow-up.  She states that the above postoperative complaints have resolved  Past Medical History  Diagnosis Date  . Obesity   . Hyperlipidemia   . Type II or unspecified type diabetes mellitus without mention of complication, not stated as uncontrolled   . Allergy   . Arthritis   . Gout   . Vitamin D deficiency   . Chronic diastolic CHF (congestive heart failure), NYHA class 1 ([place])   . Hypertension   . Atrial fibrillation ([place])   . CKD (chronic kidney disease), stage III   . GERD (gastroesophageal reflux disease)   . Anxiety     mild  . Carotid artery stenosis     underwent right carotid endarterectomy with bovine pericardial patch angioplasty for an 80% lesion and 40-59% left carotid stenosis    Past Surgical History  Procedure Laterality Date  . Joint replacement    . Dilitation and currage    . Vein surgery    . Cataract extraction    . Tee without cardioversion N/A 06/17/2014    Procedure: TRANSESOPHAGEAL ECHOCARDIOGRAM (TEE);  Surgeon: Fay Records, MD;  Location: [facility] ENDOSCOPY;  Service: Cardiovascular;  Laterality: N/A;  .  Cardioversion N/A 06/17/2014    Procedure: CARDIOVERSION;  Surgeon: Fay Records, MD;  Location: [place];  Service: Cardiovascular;  Laterality: N/A;  . Peripheral vascular catheterization N/A 08/17/2014    Procedure: Carotid Angiography;  Surgeon: Serafina Mitchell, MD;  Location: [facility];  Service: Cardiovascular;  Laterality: N/A;  . Endarterectomy  08/17/2014  . Endarterectomy Right 08/18/2014    Procedure: ENDARTERECTOMY CAROTID;  Surgeon: Serafina Mitchell, MD;  Location: [facility] OR;  Service: Vascular;  Laterality: Right;  . Patch angioplasty Right 08/18/2014    Procedure: PATCH ANGIOPLASTY USING XENOSURE BIOLOGIC PATCH 1CM X 6CM;  Surgeon: Serafina Mitchell, MD;  Location: [facility] OR;  Service: Vascular;  Laterality: Right;    Social History   Social History  . Marital Status: Widowed    Spouse Name: N/A  . Number of Children: N/A  . Years of Education: N/A   Occupational History  . Not on file.   Social History Main Topics  . Smoking status: Never Smoker   . Smokeless tobacco: Never Used  . Alcohol Use: No  . Drug Use: No  . Sexual Activity: No   Other Topics Concern  . Not on file   Social History Narrative    Family History  Problem Relation Age of Onset  . Hypertension Mother   . Stroke Mother   . Asthma Father   . Hypertension Father   . Hyperlipidemia  Father   . Heart attack Father   . Diabetes Brother   . Sleep apnea Daughter   . Asthma Son   . Hypertension Son     Allergies as of 11/28/2014 - Review Complete 11/28/2014  Allergen Reaction Noted  . Codeine Other (See Comments) 12/25/2011  . Lisinopril Other (See Comments) 02/02/2013  . Tylenol pm extra [diphenhydramine-apap (sleep)]  05/09/2012    Current Outpatient Prescriptions on File Prior to Visit  Medication Sig Dispense Refill  . allopurinol (ZYLOPRIM) 300 MG tablet Take 150 mg by mouth at bedtime.    . ALPRAZolam (XANAX) 1 MG tablet TAKE 1/2 TO 1 TABLET EVERY DAY AT BEDTIME AS NEEDED FOR  SLEEP 30 tablet 3  . cholecalciferol (VITAMIN D) 1000 UNITS tablet Take 4,000 Units by mouth every morning.     Marland Kitchen ELIQUIS 2.5 MG TABS tablet TAKE 1 TABLET BY MOUTH TWICE A DAY 60 tablet 5  . Flaxseed, Linseed, (FLAX SEED OIL) 1000 MG CAPS Take 1 capsule by mouth every morning.    . furosemide (LASIX) 40 MG tablet Take 20 mg by mouth daily.  6  . gabapentin (NEURONTIN) 300 MG capsule TAKE ONE CAPSULE BY MOUTH EVERY DAY AT BEDTIME 90 capsule 4  . gemfibrozil (LOPID) 600 MG tablet TAKE 1 TABLET BY MOUTH TWICE A DAY WITH FOOD FOR CHOLESTEROL 180 tablet 0  . Lancets (FREESTYLE) lancets Check blood glucose 1 time daily.  DX-E11.22 100 each 6  . losartan (COZAAR) 25 MG tablet Take 25 mg by mouth daily.  11  . magnesium oxide (MAG-OX) 400 MG tablet Take 400 mg by mouth daily.    . metoprolol (LOPRESSOR) 50 MG tablet Take 1 tablet (50 mg total) by mouth 2 (two) times daily. 60 tablet 6  . omeprazole (PRILOSEC) 20 MG capsule TAKE ONE CAPSULE EVERY MORNING FOR ACID 90 capsule 1  . ranitidine (ZANTAC) 300 MG tablet Take 1 tablet (300 mg total) by mouth as needed for heartburn. 60 tablet 3  . vitamin B-12 (CYANOCOBALAMIN) 1000 MCG tablet Take 1,000 mcg by mouth every morning.    . vitamin C (ASCORBIC ACID) 500 MG tablet Take 500 mg by mouth 2 (two) times daily.      No current facility-administered medications on file prior to visit.     REVIEW OF SYSTEMS: See history of present illness, otherwise all systems negative  PHYSICAL EXAMINATION:   Vital signs are  Filed Vitals:   11/28/14 0921 11/28/14 0924  BP: 137/70 147/72  Pulse: 54 58  Temp:  97.7 F (36.5 C)  TempSrc:  Oral  Resp:  14  Height:  5\' 4"  (1.626 m)  Weight:  186 lb (84.369 kg)  SpO2:  95%   Body mass index is 31.91 kg/(m^2). General: The patient appears their stated age. HEENT:  No gross abnormalities Pulmonary:  Non labored breathing Abdomen: Soft and non-tender Musculoskeletal: There are no major deformities. Neurologic:  No focal weakness or paresthesias are detected, tongue is midline.  Smile is symmetric.  Grip strength is equal Skin: There are no ulcer or rashes noted. Psychiatric: The patient has normal affect. Cardiovascular: There is a regular rate and rhythm without significant murmur appreciated.  No carotid bruits   Diagnostic Studies None  Assessment: Status post right carotid endarterectomy Plan: The patient is doing very well.  Most of her complaints have completely resolved.  We discussed proceeding with surveillance.  She would like this performed in my office.  She will follow-up in 6 months  with a repeat carotid ultrasound  V. Leia Alf, M.D. [facility] Office: [phone] Pager:  [phone]

## 2014-12-09 DIAGNOSIS — Z9849 Cataract extraction status, unspecified eye: Secondary | ICD-10-CM | POA: Diagnosis not present

## 2014-12-09 DIAGNOSIS — H524 Presbyopia: Secondary | ICD-10-CM | POA: Diagnosis not present

## 2014-12-09 DIAGNOSIS — H43393 Other vitreous opacities, bilateral: Secondary | ICD-10-CM | POA: Diagnosis not present

## 2014-12-09 DIAGNOSIS — H52223 Regular astigmatism, bilateral: Secondary | ICD-10-CM | POA: Diagnosis not present

## 2014-12-09 DIAGNOSIS — H5211 Myopia, right eye: Secondary | ICD-10-CM | POA: Diagnosis not present

## 2014-12-09 DIAGNOSIS — E119 Type 2 diabetes mellitus without complications: Secondary | ICD-10-CM | POA: Diagnosis not present

## 2014-12-09 DIAGNOSIS — Z961 Presence of intraocular lens: Secondary | ICD-10-CM | POA: Diagnosis not present

## 2014-12-19 ENCOUNTER — Ambulatory Visit: Payer: Self-pay | Admitting: Internal Medicine

## 2014-12-19 MED ORDER — LOSARTAN POTASSIUM 25 MG PO TABS: 25.0000 mg | ORAL_TABLET | Freq: Every day | ORAL | Status: AC

## 2014-12-19 MED ORDER — METOPROLOL TARTRATE 50 MG PO TABS: 50.0000 mg | ORAL_TABLET | Freq: Two times a day (BID) | ORAL | Status: DC

## 2014-12-19 NOTE — Telephone Encounter (Signed)
Patient is taking. Patient requests refill for Metoprolol and Losartan as well.  Med list updated.

## 2014-12-21 ENCOUNTER — Ambulatory Visit (INDEPENDENT_AMBULATORY_CARE_PROVIDER_SITE_OTHER): Payer: Medicare Other | Admitting: Internal Medicine

## 2014-12-21 ENCOUNTER — Encounter: Payer: Self-pay | Admitting: Internal Medicine

## 2014-12-21 VITALS — BP 158/76 | HR 64 | Temp 98.2°F | Resp 18 | Ht 63.0 in | Wt 186.0 lb

## 2014-12-21 DIAGNOSIS — E559 Vitamin D deficiency, unspecified: Secondary | ICD-10-CM | POA: Diagnosis not present

## 2014-12-21 DIAGNOSIS — Z79899 Other long term (current) drug therapy: Secondary | ICD-10-CM

## 2014-12-21 DIAGNOSIS — I6521 Occlusion and stenosis of right carotid artery: Secondary | ICD-10-CM | POA: Diagnosis not present

## 2014-12-21 DIAGNOSIS — I10 Essential (primary) hypertension: Secondary | ICD-10-CM | POA: Diagnosis not present

## 2014-12-21 DIAGNOSIS — E1129 Type 2 diabetes mellitus with other diabetic kidney complication: Secondary | ICD-10-CM | POA: Diagnosis not present

## 2014-12-21 DIAGNOSIS — I5032 Chronic diastolic (congestive) heart failure: Secondary | ICD-10-CM

## 2014-12-21 DIAGNOSIS — E1122 Type 2 diabetes mellitus with diabetic chronic kidney disease: Secondary | ICD-10-CM

## 2014-12-21 DIAGNOSIS — E782 Mixed hyperlipidemia: Secondary | ICD-10-CM

## 2014-12-21 DIAGNOSIS — N184 Chronic kidney disease, stage 4 (severe): Secondary | ICD-10-CM | POA: Diagnosis not present

## 2014-12-21 LAB — HEPATIC FUNCTION PANEL
ALK PHOS: 103 U/L (ref 33–130)
ALT: 9 U/L (ref 6–29)
AST: 19 U/L (ref 10–35)
Albumin: 4.3 g/dL (ref 3.6–5.1)
BILIRUBIN DIRECT: 0.1 mg/dL (ref <=0.2)
BILIRUBIN INDIRECT: 0.4 mg/dL (ref 0.2–1.2)
TOTAL PROTEIN: 6.9 g/dL (ref 6.1–8.1)
Total Bilirubin: 0.5 mg/dL (ref 0.2–1.2)

## 2014-12-21 LAB — CBC WITH DIFFERENTIAL/PLATELET
BASOS ABS: 0.1 10*3/uL (ref 0.0–0.1)
Basophils Relative: 1 % (ref 0–1)
EOS PCT: 4 % (ref 0–5)
Eosinophils Absolute: 0.3 10*3/uL (ref 0.0–0.7)
HEMATOCRIT: 35.3 % — AB (ref 36.0–46.0)
HEMOGLOBIN: 12.3 g/dL (ref 12.0–15.0)
LYMPHS PCT: 26 % (ref 12–46)
Lymphs Abs: 1.8 10*3/uL (ref 0.7–4.0)
MCH: 31.9 pg (ref 26.0–34.0)
MCHC: 34.8 g/dL (ref 30.0–36.0)
MCV: 91.7 fL (ref 78.0–100.0)
MPV: 11.9 fL (ref 8.6–12.4)
Monocytes Absolute: 0.6 10*3/uL (ref 0.1–1.0)
Monocytes Relative: 9 % (ref 3–12)
NEUTROS ABS: 4.3 10*3/uL (ref 1.7–7.7)
Neutrophils Relative %: 60 % (ref 43–77)
Platelets: 233 10*3/uL (ref 150–400)
RBC: 3.85 MIL/uL — AB (ref 3.87–5.11)
RDW: 14.4 % (ref 11.5–15.5)
WBC: 7.1 10*3/uL (ref 4.0–10.5)

## 2014-12-21 LAB — HEMOGLOBIN A1C
Hgb A1c MFr Bld: 6.3 % — ABNORMAL HIGH (ref <5.7)
Mean Plasma Glucose: 134 mg/dL — ABNORMAL HIGH (ref <117)

## 2014-12-21 LAB — BASIC METABOLIC PANEL WITH GFR
BUN: 40 mg/dL — ABNORMAL HIGH (ref 7–25)
CO2: 26 mmol/L (ref 20–31)
Calcium: 10 mg/dL (ref 8.6–10.4)
Chloride: 99 mmol/L (ref 98–110)
Creat: 1.34 mg/dL — ABNORMAL HIGH (ref 0.60–0.88)
GFR, EST AFRICAN AMERICAN: 43 mL/min — AB (ref >=60)
GFR, EST NON AFRICAN AMERICAN: 37 mL/min — AB (ref >=60)
GLUCOSE: 96 mg/dL (ref 65–99)
POTASSIUM: 4.8 mmol/L (ref 3.5–5.3)
Sodium: 137 mmol/L (ref 135–146)

## 2014-12-21 LAB — LIPID PANEL
CHOL/HDL RATIO: 2.8 ratio (ref <=5.0)
Cholesterol: 179 mg/dL (ref 125–200)
HDL: 65 mg/dL (ref >=46)
LDL CALC: 90 mg/dL (ref <130)
TRIGLYCERIDES: 122 mg/dL (ref <150)
VLDL: 24 mg/dL (ref <30)

## 2014-12-21 NOTE — Progress Notes (Signed)
Patient ID: [name] [name], female   DOB: [birth year], 81 y.o.   MRN: [id number]  Assessment and Plan:  Hypertension:  -BP slightly high today but think that this is likely secondary to stress and anxiety over depression -Continue medication -monitor blood pressure at home. -Continue DASH diet -Reminder to go to the ER if any CP, SOB, nausea, dizziness, severe HA, changes vision/speech, left arm numbness and tingling and jaw pain.  Cholesterol - Continue diet and exercise -Check cholesterol.   Diabetes with diabetic chronic kidney disease -Continue diet and exercise.  -Check A1C  Vitamin D Def -check level -continue medications.   Continue diet and meds as discussed. Further disposition pending results of labs. Discussed med's effects and SE's.    HPI 81 y.o. female  presents for 3 month follow up with hypertension, hyperlipidemia, diabetes and vitamin D deficiency.   Her blood pressure has not been controlled at home, today their BP is BP: (!) 158/76 mmHg.She does not workout. She denies chest pain, shortness of breath, dizziness. She reports that her blood pressure has been creeping up for the past month. She reports that she had her dose changed by Dr. Trula Slade.  She would like to go back up to 50 mg.  She reports that it is generally 140/77   She is on cholesterol medication and denies myalgias. Her cholesterol is at goal. The cholesterol was:  06/20/2014: Cholesterol 136; HDL 56.50; LDL Cholesterol 64; Triglycerides 80.0   She has been working on diet and exercise for diabetes with diabetic chronic kidney disease, she is on bASA, she is on ACE/ARB, and denies  foot ulcerations, hyperglycemia, hypoglycemia , increased appetite, nausea, paresthesia of the feet, polydipsia, polyuria, visual disturbances, vomiting and weight loss. Last A1C was: 09/14/2014: Hgb A1c MFr Bld 6.3*   Patient is on Vitamin D supplement. 09/14/2014: Vit D, 25-Hydroxy 53   She reports that she has been under a  lot of stress lately as one of her grandchildren is getting a divorce and her great grandchild is very sick.   Current Medications:  Current Outpatient Prescriptions on File Prior to Visit  Medication Sig Dispense Refill  . allopurinol (ZYLOPRIM) 300 MG tablet Take 150 mg by mouth at bedtime.    . ALPRAZolam (XANAX) 1 MG tablet TAKE 1/2 TO 1 TABLET EVERY DAY AT BEDTIME AS NEEDED FOR SLEEP 30 tablet 3  . cholecalciferol (VITAMIN D) 1000 UNITS tablet Take 4,000 Units by mouth every morning.     . diltiazem (CARDIZEM CD) 240 MG 24 hr capsule TAKE ONE CAPSULE BY MOUTH EVERY DAY 90 capsule 0  . ELIQUIS 2.5 MG TABS tablet TAKE 1 TABLET BY MOUTH TWICE A DAY 60 tablet 5  . Flaxseed, Linseed, (FLAX SEED OIL) 1000 MG CAPS Take 1 capsule by mouth every morning.    . furosemide (LASIX) 40 MG tablet Take 20 mg by mouth daily.  6  . gabapentin (NEURONTIN) 300 MG capsule TAKE ONE CAPSULE BY MOUTH EVERY DAY AT BEDTIME 90 capsule 4  . gemfibrozil (LOPID) 600 MG tablet TAKE 1 TABLET BY MOUTH TWICE A DAY WITH FOOD FOR CHOLESTEROL 180 tablet 0  . Lancets (FREESTYLE) lancets Check blood glucose 1 time daily.  DX-E11.22 100 each 6  . losartan (COZAAR) 25 MG tablet Take 1 tablet (25 mg total) by mouth daily. 90 tablet 3  . magnesium oxide (MAG-OX) 400 MG tablet Take 400 mg by mouth daily.    . metoprolol (LOPRESSOR) 50 MG tablet Take 1 tablet (  50 mg total) by mouth 2 (two) times daily. 180 tablet 3  . omeprazole (PRILOSEC) 20 MG capsule TAKE ONE CAPSULE EVERY MORNING FOR ACID 90 capsule 1  . ranitidine (ZANTAC) 300 MG tablet Take 1 tablet (300 mg total) by mouth as needed for heartburn. 60 tablet 3  . vitamin B-12 (CYANOCOBALAMIN) 1000 MCG tablet Take 1,000 mcg by mouth every morning.    . vitamin C (ASCORBIC ACID) 500 MG tablet Take 500 mg by mouth 2 (two) times daily.      No current facility-administered medications on file prior to visit.   Medical History:  Past Medical History  Diagnosis Date  . Obesity    . Hyperlipidemia   . Type II or unspecified type diabetes mellitus without mention of complication, not stated as uncontrolled   . Allergy   . Arthritis   . Gout   . Vitamin D deficiency   . Chronic diastolic CHF (congestive heart failure), NYHA class 1 ([place])   . Hypertension   . Atrial fibrillation ([place])   . CKD (chronic kidney disease), stage III   . GERD (gastroesophageal reflux disease)   . Anxiety     mild  . Carotid artery stenosis     underwent right carotid endarterectomy with bovine pericardial patch angioplasty for an 80% lesion and 40-59% left carotid stenosis   Allergies:  Allergies  Allergen Reactions  . Codeine Other (See Comments)    "felt like things were crawling on me"  . Lisinopril Other (See Comments)    fatigue  . Tylenol Pm Extra [Diphenhydramine-Apap (Sleep)]      Review of Systems:  Review of Systems  Constitutional: Negative for fever, chills and malaise/fatigue.  HENT: Negative for congestion, ear pain and sore throat.   Eyes: Negative.   Respiratory: Negative for cough, shortness of breath and wheezing.   Cardiovascular: Negative for chest pain, palpitations and leg swelling.  Gastrointestinal: Negative for heartburn, diarrhea, constipation, blood in stool and melena.  Genitourinary: Negative.   Skin: Negative.   Neurological: Negative for dizziness, sensory change, loss of consciousness and headaches.  Psychiatric/Behavioral: Negative for depression. The patient is not nervous/anxious and does not have insomnia.     Family history- Review and unchanged  Social history- Review and unchanged  Physical Exam: BP 158/76 mmHg  Pulse 64  Temp(Src) 98.2 F (36.8 C) (Temporal)  Resp 18  Ht 5\' 3"  (1.6 m)  Wt 186 lb (84.369 kg)  BMI 32.96 kg/m2 Wt Readings from Last 3 Encounters:  12/21/14 186 lb (84.369 kg)  11/28/14 186 lb (84.369 kg)  10/24/14 187 lb 6.4 oz (85.004 kg)   General Appearance: Well nourished well developed, non-toxic  appearing, in no apparent distress. Eyes: PERRLA, EOMs, conjunctiva no swelling or erythema ENT/Mouth: Ear canals clear with no erythema, swelling, or discharge.  TMs normal bilaterally, oropharynx clear, moist, with no exudate.   Neck: Supple, thyroid normal, no JVD, no cervical adenopathy.  Respiratory: Respiratory effort normal, breath sounds clear A&P, no wheeze, rhonchi or rales noted.  No retractions, no accessory muscle usage.   Cardio: RRR with no MRGs. No noted edema.  Abdomen: Soft, + BS.  Non tender, no guarding, rebound, hernias, masses. Musculoskeletal: Full ROM, 5/5 strength, walks slowly with a cane Skin: Warm, dry without rashes, lesions, ecchymosis.  Neuro: Awake and oriented X 3, Cranial nerves intact. No cerebellar symptoms.  Psych: normal affect, Insight and Judgment appropriate.    Starlyn Skeans, PA-C 11:04 AM [facility]

## 2014-12-22 LAB — TSH: TSH: 0.493 u[IU]/mL (ref 0.350–4.500)

## 2014-12-27 ENCOUNTER — Encounter: Payer: Self-pay | Admitting: Internal Medicine

## 2014-12-27 ENCOUNTER — Telehealth: Payer: Self-pay | Admitting: Internal Medicine

## 2014-12-27 NOTE — Telephone Encounter (Signed)
Patients daughter called and stated that patient is not feeling better and patient is requesting a zpack. [facility] [address]. Please advise  Katrina

## 2014-12-27 NOTE — Telephone Encounter (Signed)
Patient refused Mammogram and DEXA

## 2014-12-28 ENCOUNTER — Telehealth: Payer: Self-pay | Admitting: Internal Medicine

## 2014-12-28 MED ORDER — AZITHROMYCIN 250 MG PO TABS: ORAL_TABLET | ORAL | Status: AC

## 2014-12-28 NOTE — Addendum Note (Signed)
Addended by: Vicie Mutters R on: 12/28/2014 10:09 AM   Modules accepted: Orders

## 2015-01-02 ENCOUNTER — Other Ambulatory Visit: Payer: Self-pay | Admitting: Internal Medicine

## 2015-01-05 NOTE — Telephone Encounter (Signed)
Second request for 2nd round of antibiotics

## 2015-01-06 ENCOUNTER — Other Ambulatory Visit: Payer: Self-pay | Admitting: Cardiology

## 2015-01-06 ENCOUNTER — Other Ambulatory Visit: Payer: Self-pay | Admitting: Internal Medicine

## 2015-01-13 ENCOUNTER — Other Ambulatory Visit: Payer: Self-pay | Admitting: Internal Medicine

## 2015-01-13 ENCOUNTER — Other Ambulatory Visit: Payer: Self-pay | Admitting: Physician Assistant

## 2015-01-23 ENCOUNTER — Ambulatory Visit: Payer: Medicare Other | Admitting: Internal Medicine

## 2015-02-16 ENCOUNTER — Other Ambulatory Visit: Payer: Self-pay | Admitting: Internal Medicine

## 2015-02-16 ENCOUNTER — Other Ambulatory Visit: Payer: Self-pay | Admitting: Physician Assistant

## 2015-02-17 ENCOUNTER — Other Ambulatory Visit: Payer: Self-pay | Admitting: *Deleted

## 2015-02-17 MED ORDER — GLUCOSE BLOOD VI STRP: ORAL_STRIP | Status: DC

## 2015-02-25 ENCOUNTER — Encounter: Payer: Self-pay | Admitting: *Deleted

## 2015-02-28 ENCOUNTER — Encounter: Payer: Self-pay | Admitting: Internal Medicine

## 2015-03-20 ENCOUNTER — Ambulatory Visit: Payer: Medicare Other | Admitting: Internal Medicine

## 2015-03-22 ENCOUNTER — Encounter: Payer: Self-pay | Admitting: Internal Medicine

## 2015-03-28 ENCOUNTER — Encounter: Payer: Self-pay | Admitting: Internal Medicine

## 2015-03-28 IMAGING — US US RENAL
1 series · 14 of 25 positions shown · non-contrast
Comparison: 03/15/2009

CLINICAL DATA: Hypertension.  Chronic kidney disease, stage III.

EXAM:
RENAL/URINARY TRACT ULTRASOUND COMPLETE

[Series 1: us renal · 0.28mm/px · 14 of 37 slices shown]
[im 1/37]
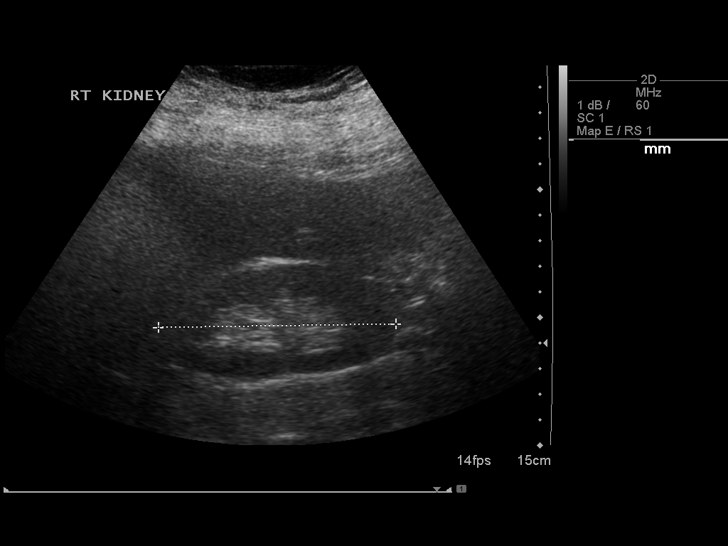
[im 4/37]
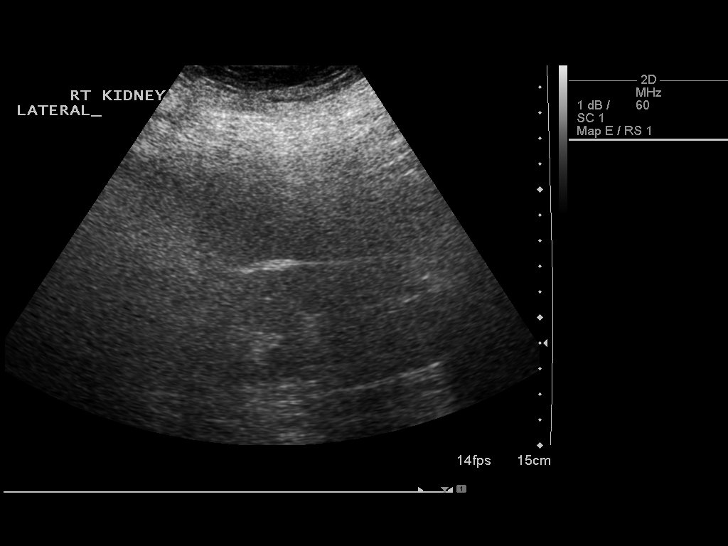
[im 7/37]
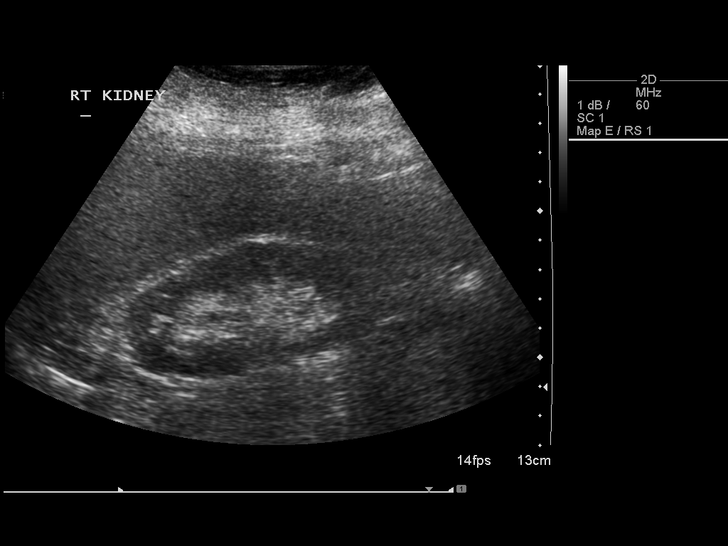
[im 10/37]
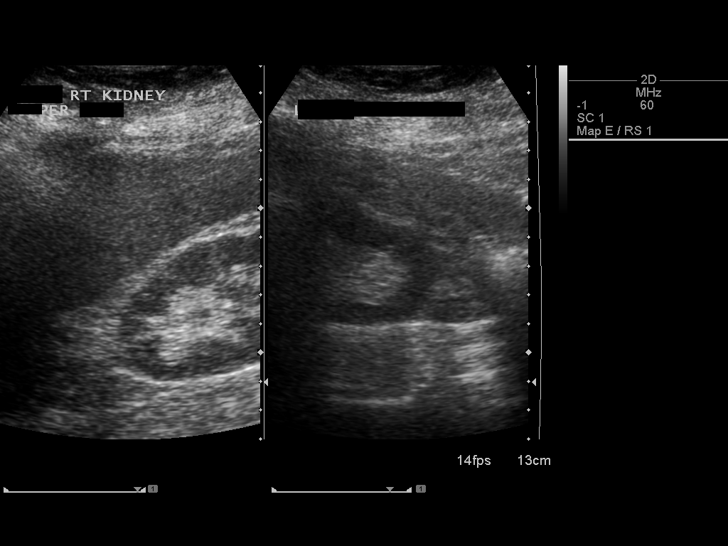
[im 13/37]
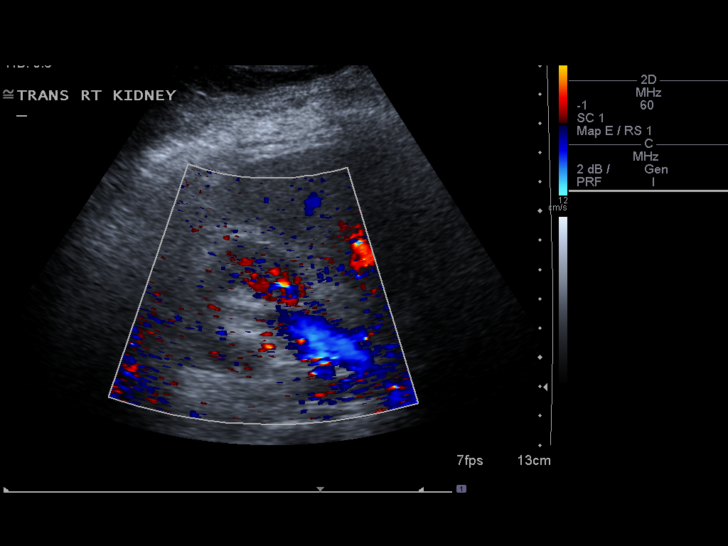
[im 14/37]
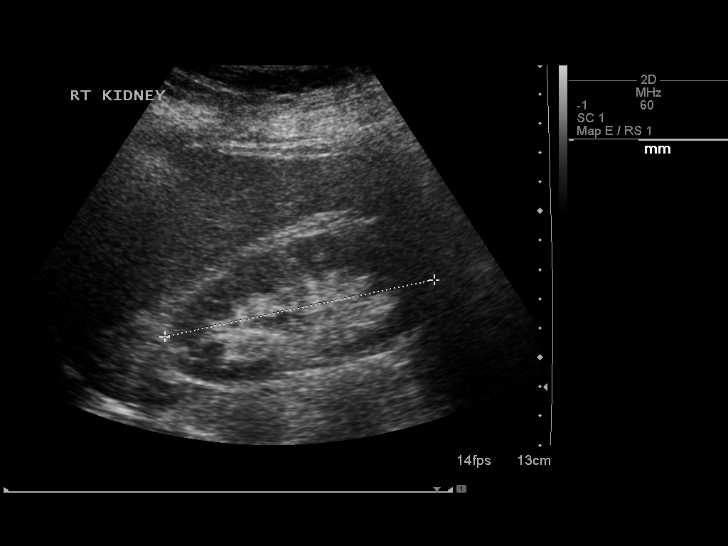
[im 17/37]
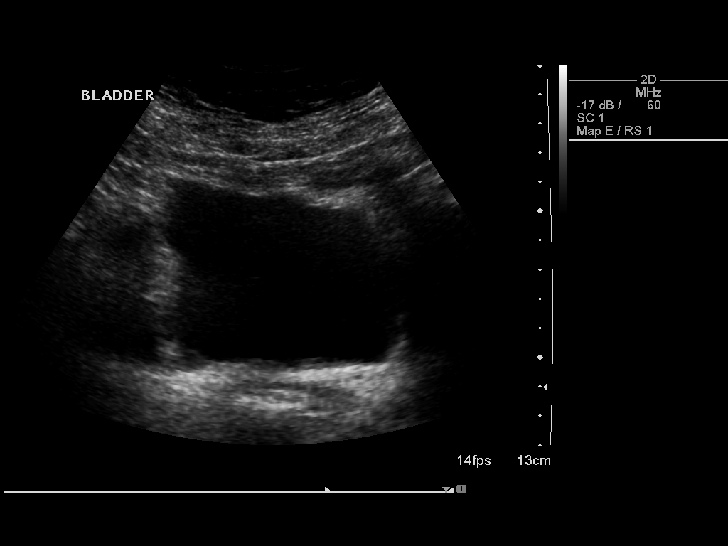
[im 20/37]
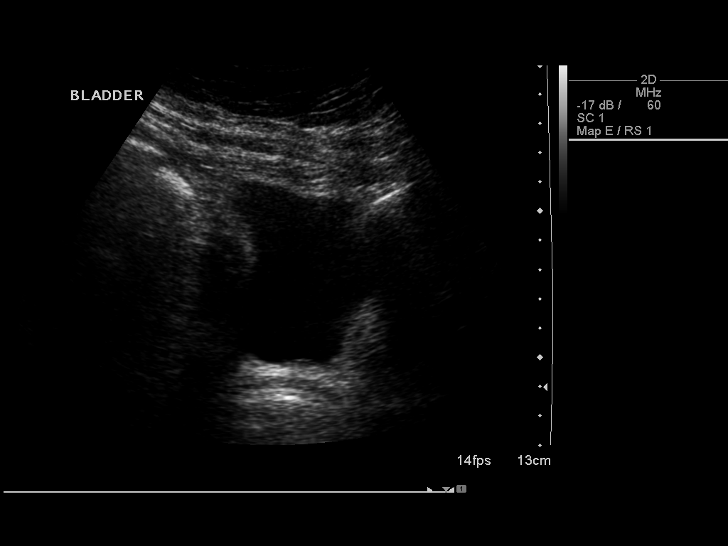
[im 23/37]
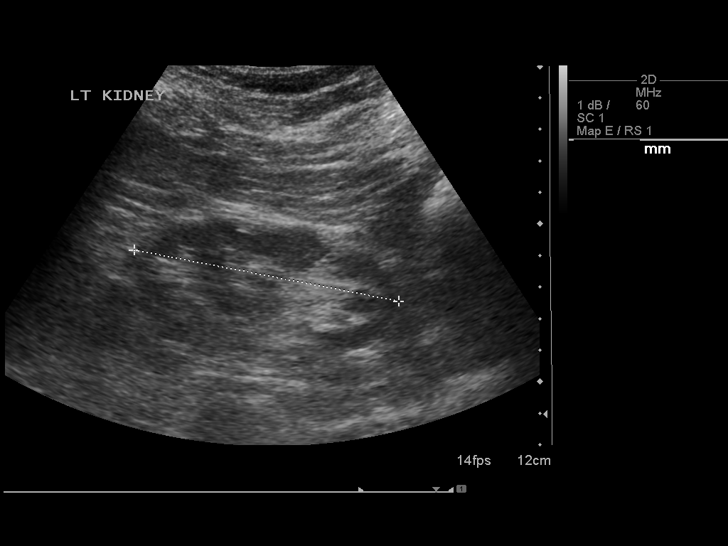
[im 25/37]
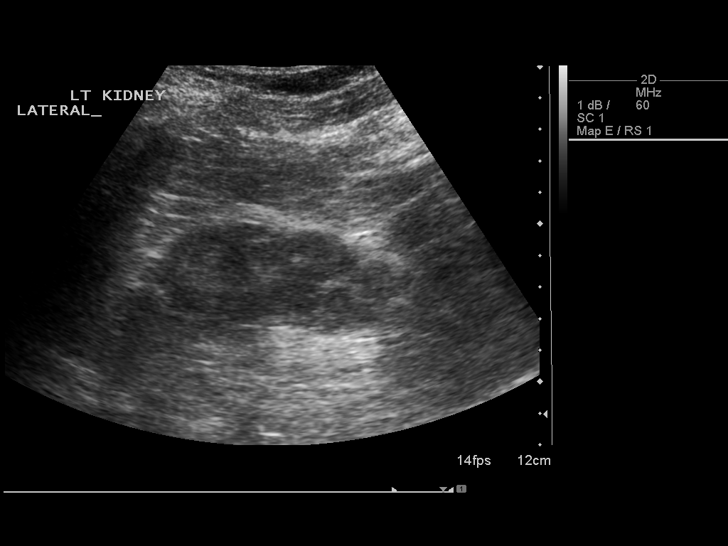
[im 28/37]
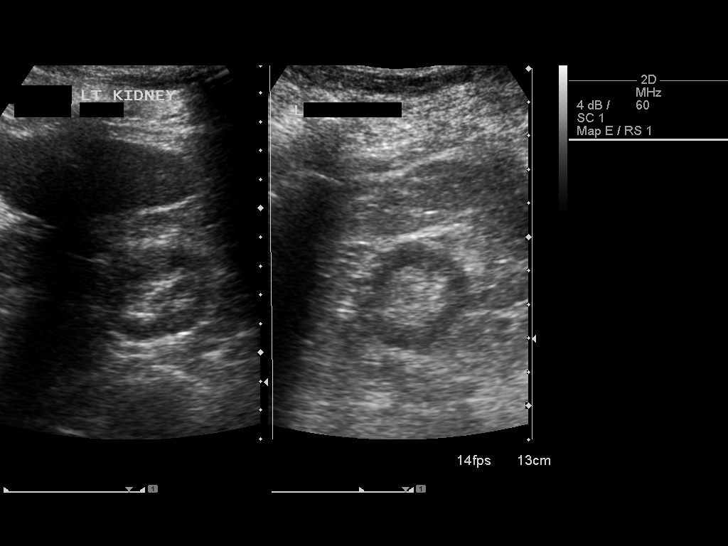
[im 31/37]
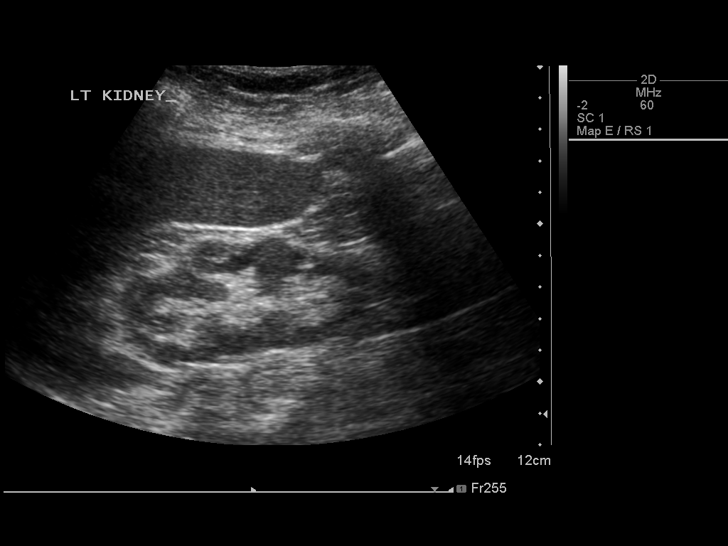
[im 34/37]
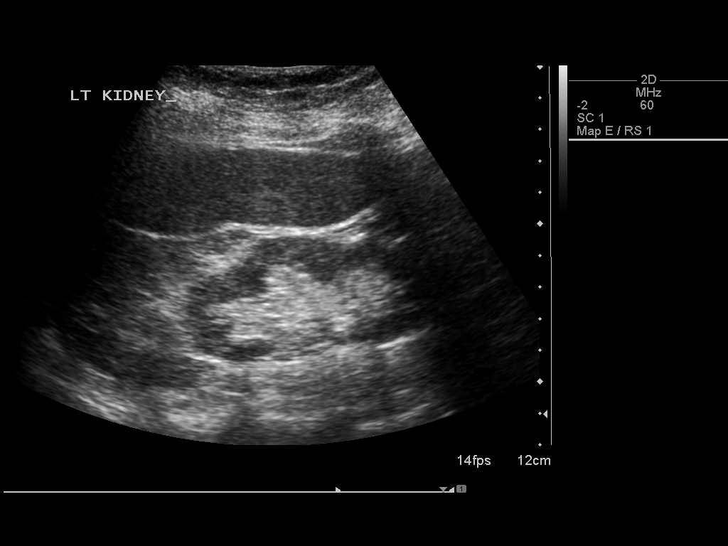
[im 37/37]
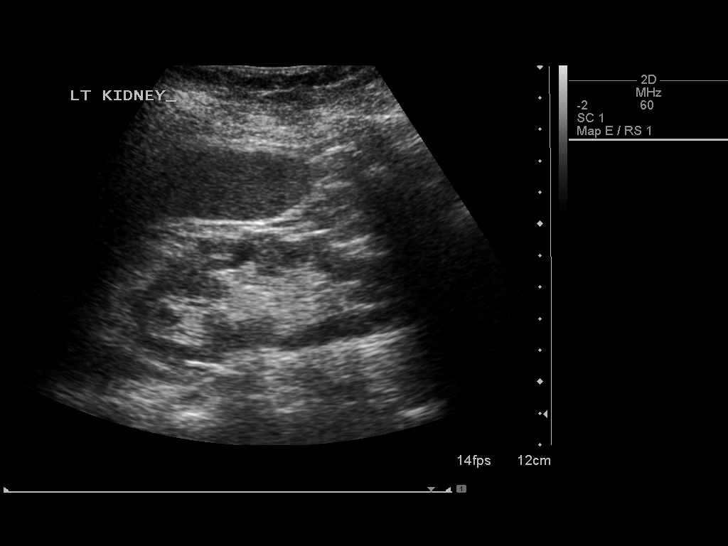

[14 of 25 positions shown; findings below may reference images not displayed]

FINDINGS: Right Kidney:

Length: 9.4 cm. Echogenicity within normal limits. No mass or
hydronephrosis visualized.

Left Kidney:

Length: 8.5 cm. Diffuse cortical thinning. Reduced size compared to
the right. Normal overall parenchymal echogenicity. No mass, stone
or hydronephrosis.

Bladder:

Appears normal for degree of bladder distention.
IMPRESSION: 1. Left renal cortical thinning with a mild decrease in the left
renal size. Left renal size has mildly decreased from the prior exam
with cortical thinning mildly increased. No other change.
2. No other abnormalities.  No hydronephrosis.

## 2015-03-31 ENCOUNTER — Encounter: Payer: Self-pay | Admitting: Internal Medicine

## 2015-03-31 ENCOUNTER — Ambulatory Visit (INDEPENDENT_AMBULATORY_CARE_PROVIDER_SITE_OTHER): Payer: Medicare Other | Admitting: Internal Medicine

## 2015-03-31 VITALS — BP 130/78 | HR 60 | Temp 97.7°F | Resp 16 | Ht 64.0 in | Wt 191.0 lb

## 2015-03-31 DIAGNOSIS — Z79899 Other long term (current) drug therapy: Secondary | ICD-10-CM | POA: Diagnosis not present

## 2015-03-31 DIAGNOSIS — E559 Vitamin D deficiency, unspecified: Secondary | ICD-10-CM | POA: Diagnosis not present

## 2015-03-31 DIAGNOSIS — N183 Chronic kidney disease, stage 3 (moderate): Secondary | ICD-10-CM

## 2015-03-31 DIAGNOSIS — I480 Paroxysmal atrial fibrillation: Secondary | ICD-10-CM | POA: Diagnosis not present

## 2015-03-31 DIAGNOSIS — E1122 Type 2 diabetes mellitus with diabetic chronic kidney disease: Secondary | ICD-10-CM

## 2015-03-31 DIAGNOSIS — E782 Mixed hyperlipidemia: Secondary | ICD-10-CM

## 2015-03-31 DIAGNOSIS — Z1212 Encounter for screening for malignant neoplasm of rectum: Secondary | ICD-10-CM | POA: Diagnosis not present

## 2015-03-31 DIAGNOSIS — M109 Gout, unspecified: Secondary | ICD-10-CM | POA: Diagnosis not present

## 2015-03-31 DIAGNOSIS — Z1389 Encounter for screening for other disorder: Secondary | ICD-10-CM

## 2015-03-31 DIAGNOSIS — I10 Essential (primary) hypertension: Secondary | ICD-10-CM | POA: Diagnosis not present

## 2015-03-31 DIAGNOSIS — M1000 Idiopathic gout, unspecified site: Secondary | ICD-10-CM

## 2015-03-31 DIAGNOSIS — Z1331 Encounter for screening for depression: Secondary | ICD-10-CM

## 2015-03-31 DIAGNOSIS — E1129 Type 2 diabetes mellitus with other diabetic kidney complication: Secondary | ICD-10-CM | POA: Diagnosis not present

## 2015-03-31 LAB — HEMOGLOBIN A1C
HEMOGLOBIN A1C: 6.3 % — AB (ref <5.7)
Mean Plasma Glucose: 134 mg/dL — ABNORMAL HIGH (ref <117)

## 2015-03-31 LAB — BASIC METABOLIC PANEL WITH GFR
BUN: 59 mg/dL — AB (ref 7–25)
CHLORIDE: 100 mmol/L (ref 98–110)
CO2: 24 mmol/L (ref 20–31)
CREATININE: 1.64 mg/dL — AB (ref 0.60–0.88)
Calcium: 10 mg/dL (ref 8.6–10.4)
GFR, EST AFRICAN AMERICAN: 33 mL/min — AB (ref >=60)
GFR, Est Non African American: 29 mL/min — ABNORMAL LOW (ref >=60)
GLUCOSE: 111 mg/dL — AB (ref 65–99)
POTASSIUM: 4.5 mmol/L (ref 3.5–5.3)
Sodium: 137 mmol/L (ref 135–146)

## 2015-03-31 LAB — HEPATIC FUNCTION PANEL
ALBUMIN: 4.2 g/dL (ref 3.6–5.1)
ALK PHOS: 104 U/L (ref 33–130)
ALT: 9 U/L (ref 6–29)
AST: 20 U/L (ref 10–35)
Bilirubin, Direct: 0.1 mg/dL (ref <=0.2)
Indirect Bilirubin: 0.3 mg/dL (ref 0.2–1.2)
TOTAL PROTEIN: 7 g/dL (ref 6.1–8.1)
Total Bilirubin: 0.4 mg/dL (ref 0.2–1.2)

## 2015-03-31 LAB — CBC WITH DIFFERENTIAL/PLATELET
BASOS ABS: 0.1 10*3/uL (ref 0.0–0.1)
Basophils Relative: 1 % (ref 0–1)
EOS ABS: 0.3 10*3/uL (ref 0.0–0.7)
EOS PCT: 4 % (ref 0–5)
HEMATOCRIT: 36.8 % (ref 36.0–46.0)
Hemoglobin: 12.3 g/dL (ref 12.0–15.0)
LYMPHS ABS: 1.8 10*3/uL (ref 0.7–4.0)
LYMPHS PCT: 28 % (ref 12–46)
MCH: 31.4 pg (ref 26.0–34.0)
MCHC: 33.4 g/dL (ref 30.0–36.0)
MCV: 93.9 fL (ref 78.0–100.0)
MONOS PCT: 8 % (ref 3–12)
MPV: 12 fL (ref 8.6–12.4)
Monocytes Absolute: 0.5 10*3/uL (ref 0.1–1.0)
Neutro Abs: 3.7 10*3/uL (ref 1.7–7.7)
Neutrophils Relative %: 59 % (ref 43–77)
Platelets: 231 10*3/uL (ref 150–400)
RBC: 3.92 MIL/uL (ref 3.87–5.11)
RDW: 14 % (ref 11.5–15.5)
WBC: 6.3 10*3/uL (ref 4.0–10.5)

## 2015-03-31 LAB — LIPID PANEL
CHOL/HDL RATIO: 3.3 ratio (ref <=5.0)
CHOLESTEROL: 185 mg/dL (ref 125–200)
HDL: 56 mg/dL (ref >=46)
LDL Cholesterol: 98 mg/dL (ref <130)
Triglycerides: 156 mg/dL — ABNORMAL HIGH (ref <150)
VLDL: 31 mg/dL — AB (ref <30)

## 2015-03-31 LAB — MAGNESIUM: MAGNESIUM: 2.1 mg/dL (ref 1.5–2.5)

## 2015-03-31 LAB — URIC ACID: Uric Acid, Serum: 6 mg/dL (ref 2.4–7.0)

## 2015-03-31 NOTE — Progress Notes (Signed)
Patient ID: [name] [name], female   DOB: 06-19-1930, 82 y.o.   MRN: [id number]  Comprehensive Evaluation &  Examination  This very nice 82 y.o. [facility] presents for a comprehensive evaluation and management of multiple medical co-morbidities.  Patient has been followed for HTN, T2_NIDDM  , Hyperlipidemia and Vitamin D Deficiency.    HTN predates since 31. Patient's BP has been controlled at home and patient denies any cardiac symptoms as chest pain, palpitations, shortness of breath, dizziness or ankle swelling. Fraser Din is also followed by Dr [name] for pAfib s/p TEE/CV (May 2016).  In July 2016, patient underwent  R CEA and has done well.Today's BP: 130/78 mmHg and she reports daily BP's range 130-145/70's.    Patient's hyperlipidemia is controlled with diet and medications. Patient denies myalgias or other medication SE's. Last lipids were at goal with Cholesterol 179; HDL 65; LDL 90; Triglycerides 122 on 12/21/2014.   Patient has diet controlled T2_NIDDM predating since 2014 with A1c 6.4%  And she reports daily FBG's range 88-105 mg%. She denies reactive hypoglycemic symptoms, visual blurring, diabetic polys, or paresthesias. Last A1c was 6.3% on 12/21/2014.   Finally, patient has history of Vitamin D Deficiency of "37" on treatment in 2010  and last Vitamin D was 53 on 09/14/2014.  Medication Sig  . allopurinol  300 MG  TAKE 1 TABLET BY MOUTH ONCE DAILY TO PREVENT GOUT  . ALPRAZolam1 MG  TAKE 1/2 OR 1 TABLET BY MOUTH AT BEDTIME AS NEEDED FOR SLEEP  . VITAMIN D 1000 UNITS Take 4,000 Units by mouth every morning.   . diltiazem  CD 240 MG  TAKE ONE CAPSULE BY MOUTH EVERY DAY  . ELIQUIS 2.5 MG TABS  TAKE 1 TABLET BY MOUTH TWICE A DAY  . FLAX SEED OIL 1000 MG  Take 1 capsule by mouth every morning.  . furosemide 40 MG  Take 20 mg by mouth daily.  Marland Kitchen gabapentin  300 MG  TAKE ONE CAPSULE BY MOUTH EVERY DAY AT BEDTIME  . gemfibrozil  600 MG  TAKE 1 TABLET BY MOUTH TWICE A DAY WITH FOOD FOR CHOLESTEROL   . losartan 25 MG Take 1 tablet (25 mg total) by mouth daily.  . Magnesium 400 MG  Take 400 mg by mouth daily.  . metoprolol50 MG  Take 1 tablet (50 mg total) by mouth 2 (two) times daily.  Marland Kitchen omeprazole  20 MG  TAKE ONE CAPSULE EVERY MORNING FOR ACID  . ranitidine  300 MG  Take 1 tablet (300 mg total) by mouth as needed for heartburn.  . vitamin B-12  1000 MCG  Take 1,000 mcg by mouth every morning.  . vitamin C  500 MG  Take 500 mg by mouth 2 (two) times daily.     Allergies  Allergen Reactions  . Codeine Other (See Comments)    "felt like things were crawling on me"  . Lisinopril Other (See Comments)    fatigue  . Tylenol Pm Extra [Diphenhydramine-Apap (Sleep)]    Past Medical History  Diagnosis Date  . Obesity   . Hyperlipidemia   . Type II or unspecified type diabetes mellitus without mention of complication, not stated as uncontrolled   . Allergy   . Arthritis   . Gout   . Vitamin D deficiency   . Chronic diastolic CHF (congestive heart failure), NYHA class 1 ([place])   . Hypertension   . Atrial fibrillation ([place])   . CKD (chronic kidney disease), stage III   .  GERD (gastroesophageal reflux disease)   . Anxiety     mild  . Carotid artery stenosis     underwent right carotid endarterectomy with bovine pericardial patch angioplasty for an 80% lesion and 40-59% left carotid stenosis   Health Maintenance  Topic Date Due  . ZOSTAVAX  12/06/2015 (Originally 02/18/1992)  . HEMOGLOBIN A1C  06/20/2015  . FOOT EXAM  08/22/2015  . INFLUENZA VACCINE  09/05/2015  . OPHTHALMOLOGY EXAM  12/09/2015  . TETANUS/TDAP  12/22/2018  . DEXA SCAN  Completed  . PNA vac Low Risk Adult  Completed   Immunization History  Administered Date(s) Administered  . Influenza Split 10/23/2012  . Influenza, High Dose Seasonal PF 11/11/2013, 11/21/2014  . Pneumococcal Conjugate-13 11/11/2013  . Pneumococcal Polysaccharide-23 02/04/2006  . Td 12/21/2008   Past Surgical History  Procedure Laterality  Date  . Joint replacement    . Dilitation and currage    . Vein surgery    . Cataract extraction    . Tee without cardioversion N/A 06/17/2014    Procedure: TRANSESOPHAGEAL ECHOCARDIOGRAM (TEE);  Surgeon: Fay Records, MD;  Location: [facility] ENDOSCOPY;  Service: Cardiovascular;  Laterality: N/A;  . Cardioversion N/A 06/17/2014    Procedure: CARDIOVERSION;  Surgeon: Fay Records, MD;  Location: [facility];  Service: Cardiovascular;  Laterality: N/A;  . Peripheral vascular catheterization N/A 08/17/2014    Procedure: Carotid Angiography;  Surgeon: Serafina Mitchell, MD;  Location: [facility] LAB;  Service: Cardiovascular;  Laterality: N/A;  . Endarterectomy  08/17/2014  . Endarterectomy Right 08/18/2014    Procedure: ENDARTERECTOMY CAROTID;  Surgeon: Serafina Mitchell, MD;  Location: [facility] OR;  Service: Vascular;  Laterality: Right;  . Patch angioplasty Right 08/18/2014    Procedure: PATCH ANGIOPLASTY USING XENOSURE BIOLOGIC PATCH 1CM X 6CM;  Surgeon: Serafina Mitchell, MD;  Location: [facility] OR;  Service: Vascular;  Laterality: Right;   Family History  Problem Relation Age of Onset  . Hypertension Mother   . Stroke Mother   . Asthma Father   . Hypertension Father   . Hyperlipidemia Father   . Heart attack Father   . Diabetes Brother   . Sleep apnea Daughter   . Asthma Son   . Hypertension Son    Social History  Substance Use Topics  . Smoking status: Never Smoker   . Smokeless tobacco: Never Used  . Alcohol Use: No    ROS Constitutional: Denies fever, chills, weight loss/gain, headaches, insomnia,  night sweats, and change in appetite. Does c/o fatigue. Eyes: Denies redness, blurred vision, diplopia, discharge, itchy, watery eyes.  ENT: Denies discharge, congestion, post nasal drip, epistaxis, sore throat, earache, hearing loss, dental pain, Tinnitus, Vertigo, Sinus pain, snoring.  Cardio: Denies chest pain, palpitations, irregular heartbeat, syncope, dyspnea, diaphoresis, orthopnea, PND,  claudication, edema Respiratory: denies cough, dyspnea, DOE, pleurisy, hoarseness, laryngitis, wheezing.  Gastrointestinal: Denies dysphagia, heartburn, reflux, water brash, pain, cramps, nausea, vomiting, bloating, diarrhea, constipation, hematemesis, melena, hematochezia, jaundice, hemorrhoids Genitourinary: Denies dysuria, frequency, urgency, nocturia, hesitancy, discharge, hematuria, flank pain Breast: Breast lumps, nipple discharge, bleeding.  Musculoskeletal: Denies arthralgia, myalgia, stiffness, Jt. Swelling, pain, limp, and strain/sprain. Denies falls. Skin: Denies puritis, rash, hives, warts, acne, eczema, changing in skin lesion Neuro: No weakness, tremor, incoordination, spasms, paresthesia, pain Psychiatric: Denies confusion, memory loss, sensory loss. Denies Depression. Endocrine: Denies change in weight, skin, hair change, nocturia, and paresthesia, diabetic polys, visual blurring, hyper / hypo glycemic episodes.  Heme/Lymph: No excessive bleeding, bruising, enlarged lymph nodes.  Physical Exam  BP 130/78 mmHg  Pulse 60  Temp(Src) 97.7 F (36.5 C)  Resp 16  Ht 5\' 4"  (1.626 m)  Wt 191 lb (86.637 kg)  BMI 32.77 kg/m2  General Appearance: Well nourished and in no apparent distress. Eyes: PERRLA, EOMs, conjunctiva no swelling or erythema, normal fundi and vessels. Sinuses: No frontal/maxillary tenderness ENT/Mouth: EACs patent / TMs  nl. Nares clear without erythema, swelling, mucoid exudates. Oral hygiene is good. No erythema, swelling, or exudate. Tongue normal, non-obstructing. Tonsils not swollen or erythematous. Hearing normal.  Neck: Supple, thyroid normal. No bruits, nodes or JVD. Respiratory: Respiratory effort normal.  BS equal and clear bilateral without rales, rhonci, wheezing or stridor. Cardio: Heart sounds are normal with regular rate and rhythm and no murmurs, rubs or gallops. Peripheral pulses are normal and equal bilaterally without edema. No aortic or femoral  bruits. Chest: symmetric with normal excursions and percussion. Breasts: Symmetric, without lumps, nipple discharge, retractions, or fibrocystic changes.  Abdomen: Flat, soft, with bowl sounds. Nontender, no guarding, rebound, hernias, masses, or organomegaly.  Lymphatics: Non tender without lymphadenopathy.  Genitourinary:  Musculoskeletal: Full ROM all peripheral extremities, joint stability, 5/5 strength, and normal gait. Skin: Warm and dry without rashes, lesions, cyanosis, clubbing or  ecchymosis.  Neuro: Cranial nerves intact, reflexes equal bilaterally. Normal muscle tone, no cerebellar symptoms. Sensation intact.  Pysch: Alert and oriented X 3, normal affect, Insight and Judgment appropriate.   Assessment and Plan  1. Essential hypertension  - EKG 12-Lead - [place], RETROPERITNL ABD,  LTD - TSH  2. Mixed hyperlipidemia  - Lipid panel - TSH  3. Type 2 diabetes mellitus with stage 3 chronic kidney disease, without long-term current use of insulin (HCC)  - Microalbumin / creatinine urine ratio - Hemoglobin A1c - Insulin, random  4. Vitamin D deficiency  - VITAMIN D 25 Hydroxy   5. PAF (paroxysmal atrial fibrillation) ([place])   6. Medication management  - Urinalysis, Routine w reflex microscopic - CBC with Differential/Platelet - BASIC METABOLIC PANEL WITH GFR - Hepatic function panel - Magnesium  7. Idiopathic gout  - Uric acid  8. Screening for rectal cancer  - POC Hemoccult Bld/Stl   9. Depression screen   Continue prudent diet as discussed, weight control, BP monitoring, regular exercise, and medications. Discussed med's effects and SE's. Screening labs and tests as requested with regular follow-up as recommended. Over 40 minutes of exam, counseling, chart review and high complex critical decision making was performed.

## 2015-03-31 NOTE — Patient Instructions (Signed)
Recommend Adult Low Dose Aspirin or   coated  Aspirin 81 mg daily   To reduce risk of Colon Cancer 20 %,   Skin Cancer 26 % ,   Melanoma 46%   and   Pancreatic cancer 60%   ++++++++++++++++++++++++++++++++++++++++++++++++++++++ Vitamin D goal   is between 70-100.   Please make sure that you are taking your Vitamin D as directed.   It is very important as a natural anti-inflammatory   helping hair, skin, and nails, as well as reducing stroke and heart attack risk.   It helps your bones and helps with mood.  It also decreases numerous cancer risks so please take it as directed.   Low Vit D is associated with a 200-300% higher risk for CANCER   and 200-300% higher risk for HEART   ATTACK  &  STROKE.   .....................................Marland Kitchen  It is also associated with higher death rate at younger ages,   autoimmune diseases like Rheumatoid arthritis, Lupus, Multiple Sclerosis.     Also many other serious conditions, like depression, Alzheimer's  Dementia, infertility, muscle aches, fatigue, fibromyalgia - just to name a few.  ++++++++++++++++++++++++++++++++++++++++++++++++  Recommend the book "The END of DIETING" by Dr Excell Seltzer   & the book "The END of DIABETES " by Dr Excell Seltzer  At [email or web address] - get book & Audio CD's     Being diabetic has a  300% increased risk for heart attack, stroke, cancer, and alzheimer- type vascular dementia. It is very important that you work harder with diet by avoiding all foods that are white. Avoid white rice (brown & wild rice is OK), white potatoes (sweetpotatoes in moderation is OK), White bread or wheat bread or anything made out of white flour like bagels, donuts, rolls, buns, biscuits, cakes, pastries, cookies, pizza crust, and pasta (made from white flour & egg whites) - vegetarian pasta or spinach or wheat pasta is OK. Multigrain breads like Arnold's or [employer], or multigrain sandwich thins or flatbreads.  Diet,  exercise and weight loss can reverse and cure diabetes in the early stages.  Diet, exercise and weight loss is very important in the control and prevention of complications of diabetes which affects every system in your body, ie. Brain - dementia/stroke, eyes - glaucoma/blindness, heart - heart attack/heart failure, kidneys - dialysis, stomach - gastric paralysis, intestines - malabsorption, nerves - severe painful neuritis, circulation - gangrene & loss of a leg(s), and finally cancer and Alzheimers.    I recommend avoid fried & greasy foods,  sweets/candy, white rice (brown or wild rice or Quinoa is OK), white potatoes (sweet potatoes are OK) - anything made from white flour - bagels, doughnuts, rolls, buns, biscuits,white and wheat breads, pizza crust and traditional pasta made of white flour & egg white(vegetarian pasta or spinach or wheat pasta is OK).  Multi-grain bread is OK - like multi-grain flat bread or sandwich thins. Avoid alcohol in excess. Exercise is also important.    Eat all the vegetables you want - avoid meat, especially red meat and dairy - especially cheese.  Cheese is the most concentrated form of trans-fats which is the worst thing to clog up our arteries. Veggie cheese is OK which can be found in the fresh produce section at [employer] or [employer] or [employer]  ++++++++++++++++++++++++++++++++++++++++++++++++++ DASH Eating Plan  DASH stands for "Dietary Approaches to Stop Hypertension."   The DASH eating plan is a healthy eating plan that has been shown to reduce high blood  pressure (hypertension). Additional health benefits may include reducing the risk of type 2 diabetes mellitus, heart disease, and stroke. The DASH eating plan may also help with weight loss.  WHAT DO I NEED TO KNOW ABOUT THE DASH EATING PLAN?  For the DASH eating plan, you will follow these general guidelines:  Choose foods with a percent daily value for sodium of less than 5% (as listed on the food  label).  Use salt-free seasonings or herbs instead of table salt or sea salt.  Check with your health care provider or pharmacist before using salt substitutes.  Eat lower-sodium products, often labeled as "lower sodium" or "no salt added."  Eat fresh foods.  Eat more vegetables, fruits, and low-fat dairy products.    Choose whole grains. Look for the word "whole" as the first word in the ingredient list.  Choose fish   Limit sweets, desserts, sugars, and sugary drinks.  Choose heart-healthy fats.  Eat veggie cheese   Eat more home-cooked food and less restaurant, buffet, and fast food.  Limit fried foods.  Cook foods using methods other than frying.  Limit canned vegetables. If you do use them, rinse them well to decrease the sodium.  When eating at a restaurant, ask that your food be prepared with less salt, or no salt if possible.                      WHAT FOODS CAN I EAT?  Read Dr Fara Olden Fuhrman's books on The End of Dieting & The End of Diabetes  Grains  Whole grain or whole wheat bread. Brown rice. Whole grain or whole wheat pasta. Quinoa, bulgur, and whole grain cereals. Low-sodium cereals. Corn or whole wheat flour tortillas. Whole grain cornbread. Whole grain crackers. Low-sodium crackers.  Vegetables  Fresh or frozen vegetables (raw, steamed, roasted, or grilled). Low-sodium or reduced-sodium tomato and vegetable juices. Low-sodium or reduced-sodium tomato sauce and paste. Low-sodium or reduced-sodium canned vegetables.   Fruits  All fresh, canned (in natural juice), or frozen fruits.  Protein Products   All fish and seafood.  Dried beans, peas, or lentils. Unsalted nuts and seeds. Unsalted canned beans.  Dairy  Low-fat dairy products, such as skim or 1% milk, 2% or reduced-fat cheeses, low-fat ricotta or cottage cheese, or plain low-fat yogurt. Low-sodium or reduced-sodium cheeses.  Fats and Oils  Tub margarines without trans fats. Light or  reduced-fat mayonnaise and salad dressings (reduced sodium). Avocado. Safflower, olive, or canola oils. Natural peanut or almond butter.  Other  Unsalted popcorn and pretzels. The items listed above may not be a complete list of recommended foods or beverages. Contact your dietitian for more options.  +++++++++++++++++++++++++++++++++++++++++++  WHAT FOODS ARE NOT RECOMMENDED?  Grains/ White flour or wheat flour  White bread. White pasta. White rice. Refined cornbread. Bagels and croissants. Crackers that contain trans fat.  Vegetables  Creamed or fried vegetables. Vegetables in a . Regular canned vegetables. Regular canned tomato sauce and paste. Regular tomato and vegetable juices.  Fruits  Dried fruits. Canned fruit in light or heavy syrup. Fruit juice.  Meat and Other Protein Products  Meat in general - RED mwaet & White meat.  Fatty cuts of meat. Ribs, chicken wings, bacon, sausage, bologna, salami, chitterlings, fatback, hot dogs, bratwurst, and packaged luncheon meats.  Dairy  Whole or 2% milk, cream, half-and-half, and cream cheese. Whole-fat or sweetened yogurt. Full-fat cheeses or blue cheese. Nondairy creamers and whipped toppings. Processed cheese, cheese spreads, or  cheese curds.  Condiments  Onion and garlic salt, seasoned salt, table salt, and sea salt. Canned and packaged gravies. Worcestershire sauce. Tartar sauce. Barbecue sauce. Teriyaki sauce. Soy sauce, including reduced sodium. Steak sauce. Fish sauce. Oyster sauce. Cocktail sauce. Horseradish. Ketchup and mustard. Meat flavorings and tenderizers. Bouillon cubes. Hot sauce. Tabasco sauce. Marinades. Taco seasonings. Relishes.  Fats and Oils Butter, stick margarine, lard, shortening and bacon fat. Coconut, palm kernel, or palm oils. Regular salad dressings.  Pickles and olives. Salted popcorn and pretzels.  The items listed above may not be a complete list of foods and beverages to avoid.   Preventive  Care for Adults  A healthy lifestyle and preventive care can promote health and wellness. Preventive health guidelines for women include the following key practices.  A routine yearly physical is a good way to check with your health care provider about your health and preventive screening. It is a chance to share any concerns and updates on your health and to receive a thorough exam.  Visit your dentist for a routine exam and preventive care every 6 months. Brush your teeth twice a day and floss once a day. Good oral hygiene prevents tooth decay and gum disease.  The frequency of eye exams is based on your age, health, family medical history, use of contact lenses, and other factors. Follow your health care provider's recommendations for frequency of eye exams.  Eat a healthy diet. Foods like vegetables, fruits, whole grains, low-fat dairy products, and lean protein foods contain the nutrients you need without too many calories. Decrease your intake of foods high in solid fats, added sugars, and salt. Eat the right amount of calories for you.Get information about a proper diet from your health care provider, if necessary.  Regular physical exercise is one of the most important things you can do for your health. Most adults should get at least 150 minutes of moderate-intensity exercise (any activity that increases your heart rate and causes you to sweat) each week. In addition, most adults need muscle-strengthening exercises on 2 or more days a week.  Maintain a healthy weight. The body mass index (BMI) is a screening tool to identify possible weight problems. It provides an estimate of body fat based on height and weight. Your health care provider can find your BMI and can help you achieve or maintain a healthy weight.For adults 20 years and older:  A BMI below 18.5 is considered underweight.  A BMI of 18.5 to 24.9 is normal.  A BMI of 25 to 29.9 is considered overweight.  A BMI of 30 and  above is considered obese.  Maintain normal blood lipids and cholesterol levels by exercising and minimizing your intake of saturated fat. Eat a balanced diet with plenty of fruit and vegetables. If your lipid or cholesterol levels are high, you are over 50, or you are at high risk for heart disease, you may need your cholesterol levels checked more frequently.Ongoing high lipid and cholesterol levels should be treated with medicines if diet and exercise are not working.  If you smoke, find out from your health care provider how to quit. If you do not use tobacco, do not start.  Lung cancer screening is recommended for adults aged 29-80 years who are at high risk for developing lung cancer because of a history of smoking. A yearly low-dose CT scan of the lungs is recommended for people who have at least a 30-pack-year history of smoking and are a current smoker or  have quit within the past 15 years. A pack year of smoking is smoking an average of 1 pack of cigarettes a day for 1 year (for example: 1 pack a day for 30 years or 2 packs a day for 15 years). Yearly screening should continue until the smoker has stopped smoking for at least 15 years. Yearly screening should be stopped for people who develop a health problem that would prevent them from having lung cancer treatment.  Avoid use of street drugs. Do not share needles with anyone. Ask for help if you need support or instructions about stopping the use of drugs.  High blood pressure causes heart disease and increases the risk of stroke.  Ongoing high blood pressure should be treated with medicines if weight loss and exercise do not work.  If you are 52-67 years old, ask your health care provider if you should take aspirin to prevent strokes.  Diabetes screening involves taking a blood sample to check your fasting blood sugar level. This should be done once every 3 years, after age 9, if you are within normal weight and without risk factors for  diabetes. Testing should be considered at a younger age or be carried out more frequently if you are overweight and have at least 1 risk factor for diabetes.  Breast cancer screening is essential preventive care for women. You should practice "breast self-awareness." This means understanding the normal appearance and feel of your breasts and may include breast self-examination. Any changes detected, no matter how small, should be reported to a health care provider. Women in their 45s and 30s should have a clinical breast exam (CBE) by a health care provider as part of a regular health exam every 1 to 3 years. After age 76, women should have a CBE every year. Starting at age 54, women should consider having a mammogram (breast X-ray test) every year. Women who have a family history of breast cancer should talk to their health care provider about genetic screening. Women at a high risk of breast cancer should talk to their health care providers about having an MRI and a mammogram every year.  Breast cancer gene (BRCA)-related cancer risk assessment is recommended for women who have family members with BRCA-related cancers. BRCA-related cancers include breast, ovarian, tubal, and peritoneal cancers. Having family members with these cancers may be associated with an increased risk for harmful changes (mutations) in the breast cancer genes BRCA1 and BRCA2. Results of the assessment will determine the need for genetic counseling and BRCA1 and BRCA2 testing.  Routine pelvic exams to screen for cancer are no longer recommended for nonpregnant women who are considered low risk for cancer of the pelvic organs (ovaries, uterus, and vagina) and who do not have symptoms. Ask your health care provider if a screening pelvic exam is right for you.  If you have had past treatment for cervical cancer or a condition that could lead to cancer, you need Pap tests and screening for cancer for at least 20 years after your  treatment. If Pap tests have been discontinued, your risk factors (such as having a new sexual partner) need to be reassessed to determine if screening should be resumed. Some women have medical problems that increase the chance of getting cervical cancer. In these cases, your health care provider may recommend more frequent screening and Pap tests.    Colorectal cancer can be detected and often prevented. Most routine colorectal cancer screening begins at the age of 73 years and  continues through age 71 years. However, your health care provider may recommend screening at an earlier age if you have risk factors for colon cancer. On a yearly basis, your health care provider may provide home test kits to check for hidden blood in the stool. Use of a small camera at the end of a tube, to directly examine the colon (sigmoidoscopy or colonoscopy), can detect the earliest forms of colorectal cancer. Talk to your health care provider about this at age 20, when routine screening begins. Direct exam of the colon should be repeated every 5-10 years through age 22 years, unless early forms of pre-cancerous polyps or small growths are found.  Osteoporosis is a disease in which the bones lose minerals and strength with aging. This can result in serious bone fractures or breaks. The risk of osteoporosis can be identified using a bone density scan. Women ages 52 years and over and women at risk for fractures or osteoporosis should discuss screening with their health care providers. Ask your health care provider whether you should take a calcium supplement or vitamin D to reduce the rate of osteoporosis.  Menopause can be associated with physical symptoms and risks. Hormone replacement therapy is available to decrease symptoms and risks. You should talk to your health care provider about whether hormone replacement therapy is right for you.  Use sunscreen. Apply sunscreen liberally and repeatedly throughout the day. You  should seek shade when your shadow is shorter than you. Protect yourself by wearing long sleeves, pants, a wide-brimmed hat, and sunglasses year round, whenever you are outdoors.  Once a month, do a whole body skin exam, using a mirror to look at the skin on your back. Tell your health care provider of new moles, moles that have irregular borders, moles that are larger than a pencil eraser, or moles that have changed in shape or color.  Stay current with required vaccines (immunizations).  Influenza vaccine. All adults should be immunized every year.  Tetanus, diphtheria, and acellular pertussis (Td, Tdap) vaccine. Pregnant women should receive 1 dose of Tdap vaccine during each pregnancy. The dose should be obtained regardless of the length of time since the last dose. Immunization is preferred during the 27th-36th week of gestation. An adult who has not previously received Tdap or who does not know her vaccine status should receive 1 dose of Tdap. This initial dose should be followed by tetanus and diphtheria toxoids (Td) booster doses every 10 years. Adults with an unknown or incomplete history of completing a 3-dose immunization series with Td-containing vaccines should begin or complete a primary immunization series including a Tdap dose. Adults should receive a Td booster every 10 years.    Zoster vaccine. One dose is recommended for adults aged 25 years or older unless certain conditions are present.    Pneumococcal 13-valent conjugate (PCV13) vaccine. When indicated, a person who is uncertain of her immunization history and has no record of immunization should receive the PCV13 vaccine. An adult aged 36 years or older who has certain medical conditions and has not been previously immunized should receive 1 dose of PCV13 vaccine. This PCV13 should be followed with a dose of pneumococcal polysaccharide (PPSV23) vaccine. The PPSV23 vaccine dose should be obtained at least 8 weeks after the dose  of PCV13 vaccine. An adult aged 18 years or older who has certain medical conditions and previously received 1 or more doses of PPSV23 vaccine should receive 1 dose of PCV13. The PCV13 vaccine dose should  be obtained 1 or more years after the last PPSV23 vaccine dose.    Pneumococcal polysaccharide (PPSV23) vaccine. When PCV13 is also indicated, PCV13 should be obtained first. All adults aged 65 years and older should be immunized. An adult younger than age 65 years who has certain medical conditions should be immunized. Any person who resides in a nursing home or long-term care facility should be immunized. An adult smoker should be immunized. People with an immunocompromised condition and certain other conditions should receive both PCV13 and PPSV23 vaccines. People with human immunodeficiency virus (HIV) infection should be immunized as soon as possible after diagnosis. Immunization during chemotherapy or radiation therapy should be avoided. Routine use of PPSV23 vaccine is not recommended for American Indians, [place] Natives, or people younger than 65 years unless there are medical conditions that require PPSV23 vaccine. When indicated, people who have unknown immunization and have no record of immunization should receive PPSV23 vaccine. One-time revaccination 5 years after the first dose of PPSV23 is recommended for people aged 19-64 years who have chronic kidney failure, nephrotic syndrome, asplenia, or immunocompromised conditions. People who received 1-2 doses of PPSV23 before age 65 years should receive another dose of PPSV23 vaccine at age 65 years or later if at least 5 years have passed since the previous dose. Doses of PPSV23 are not needed for people immunized with PPSV23 at or after age 65 years.   Preventive Services / Frequency  Ages 65 years and over  Blood pressure check.  Lipid and cholesterol check.  Lung cancer screening. / Every year if you are aged 55-80 years and have a  30-pack-year history of smoking and currently smoke or have quit within the past 15 years. Yearly screening is stopped once you have quit smoking for at least 15 years or develop a health problem that would prevent you from having lung cancer treatment.  Clinical breast exam.** / Every year after age 40 years.  BRCA-related cancer risk assessment.** / For women who have family members with a BRCA-related cancer (breast, ovarian, tubal, or peritoneal cancers).  Mammogram.** / Every year beginning at age 40 years and continuing for as long as you are in good health. Consult with your health care provider.  Pap test.** / Every 3 years starting at age 30 years through age 65 or 70 years with 3 consecutive normal Pap tests. Testing can be stopped between 65 and 70 years with 3 consecutive normal Pap tests and no abnormal Pap or HPV tests in the past 10 years.  Fecal occult blood test (FOBT) of stool. / Every year beginning at age 50 years and continuing until age 75 years. You may not need to do this test if you get a colonoscopy every 10 years.  Flexible sigmoidoscopy or colonoscopy.** / Every 5 years for a flexible sigmoidoscopy or every 10 years for a colonoscopy beginning at age 50 years and continuing until age 75 years.  Hepatitis C blood test.** / For all people born from 1945 through 1965 and any individual with known risks for hepatitis C.  Osteoporosis screening.** / A one-time screening for women ages 65 years and over and women at risk for fractures or osteoporosis.  Skin self-exam. / Monthly.  Influenza vaccine. / Every year.  Tetanus, diphtheria, and acellular pertussis (Tdap/Td) vaccine.** / 1 dose of Td every 10 years.  Zoster vaccine.** / 1 dose for adults aged 60 years or older.  Pneumococcal 13-valent conjugate (PCV13) vaccine.** / Consult your health care provider.    Pneumococcal polysaccharide (PPSV23) vaccine.** / 1 dose for all adults aged 62 years and older. Screening  for abdominal aortic aneurysm (AAA)  by ultrasound is recommended for people who have history of high blood pressure or who are current or former smokers.

## 2015-04-01 LAB — URINALYSIS, ROUTINE W REFLEX MICROSCOPIC
BILIRUBIN URINE: NEGATIVE
GLUCOSE, UA: NEGATIVE
HGB URINE DIPSTICK: NEGATIVE
KETONES UR: NEGATIVE
Nitrite: NEGATIVE
PH: 6 (ref 5.0–8.0)
PROTEIN: NEGATIVE
Specific Gravity, Urine: 1.01 (ref 1.001–1.035)

## 2015-04-01 LAB — MICROALBUMIN / CREATININE URINE RATIO: CREATININE, URINE: 52 mg/dL (ref 20–320)

## 2015-04-01 LAB — VITAMIN D 25 HYDROXY (VIT D DEFICIENCY, FRACTURES): VIT D 25 HYDROXY: 51 ng/mL (ref 30–100)

## 2015-04-01 LAB — URINALYSIS, MICROSCOPIC ONLY
BACTERIA UA: NONE SEEN [HPF]
CRYSTALS: NONE SEEN [HPF]
Casts: NONE SEEN [LPF]
RBC / HPF: NONE SEEN RBC/HPF (ref <=2)
WBC UA: NONE SEEN WBC/HPF (ref <=5)
Yeast: NONE SEEN [HPF]

## 2015-04-01 LAB — TSH: TSH: 0.78 mIU/L

## 2015-04-03 LAB — INSULIN, RANDOM: Insulin: 33 u[IU]/mL — ABNORMAL HIGH (ref 2.0–19.6)

## 2015-04-05 ENCOUNTER — Telehealth: Payer: Self-pay | Admitting: *Deleted

## 2015-04-05 ENCOUNTER — Other Ambulatory Visit: Payer: Self-pay | Admitting: Internal Medicine

## 2015-04-05 NOTE — Telephone Encounter (Signed)
Patient's daughter called to discuss her mother's labs that were sent to [facility].  The patient was upset and depressed about the result note and states she drinks 8-10 glasses of water a day(8 ounce glasses).  Patient requested to have labs drawn prior to her next appointment so she can discuss the results with the provider and Dr Melford Aase states it is OK to do that.

## 2015-04-18 DIAGNOSIS — M1711 Unilateral primary osteoarthritis, right knee: Secondary | ICD-10-CM | POA: Diagnosis not present

## 2015-04-18 DIAGNOSIS — Z96652 Presence of left artificial knee joint: Secondary | ICD-10-CM | POA: Diagnosis not present

## 2015-04-18 DIAGNOSIS — Z96651 Presence of right artificial knee joint: Secondary | ICD-10-CM | POA: Diagnosis not present

## 2015-04-18 DIAGNOSIS — M4806 Spinal stenosis, lumbar region: Secondary | ICD-10-CM | POA: Diagnosis not present

## 2015-04-18 DIAGNOSIS — M4722 Other spondylosis with radiculopathy, cervical region: Secondary | ICD-10-CM | POA: Diagnosis not present

## 2015-04-19 ENCOUNTER — Other Ambulatory Visit: Payer: Self-pay | Admitting: *Deleted

## 2015-04-19 DIAGNOSIS — Z1212 Encounter for screening for malignant neoplasm of rectum: Secondary | ICD-10-CM

## 2015-04-19 LAB — POC HEMOCCULT BLD/STL (HOME/3-CARD/SCREEN)
Card #2 Fecal Occult Blod, POC: NEGATIVE
Card #3 Fecal Occult Blood, POC: NEGATIVE
FECAL OCCULT BLD: NEGATIVE

## 2015-05-08 ENCOUNTER — Other Ambulatory Visit: Payer: Self-pay | Admitting: Internal Medicine

## 2015-05-08 NOTE — Progress Notes (Signed)
Cardiology Office Note    Date:  05/09/2015   ID:  [name] [name], DOB [birth year], MRN [id number]  PCP:  [name] Richards, MD  Cardiologist:  Sueanne Margarita, MD   Chief Complaint  Patient presents with  . Atrial Fibrillation  . Hypertension  . Congestive Heart Failure    History of Present Illness:  [name] [name] is a 82 y.o. female with a hx of PAFib s/p TEE/DCCV, HTN, dyslipidemia, obesity, DM, mild pulmonary HTN, moderate TR and chronic diastolic CHF and moderate left carotid artery stenosis and severe RICA stenosis s/p CEA.  She returns for follow-up. She's doing well. She has infrequent vague chest pains under her left breast only at night with normal stress test earlier in the year. She denies palpitations, syncope or dizziness.  She has chronic LE edema that is stable and resolves by morning. She has chronic DOE with grocery shopping that is stable.     Past Medical History  Diagnosis Date  . Obesity   . Hyperlipidemia     she is statin intolerant  . Type II or unspecified type diabetes mellitus without mention of complication, not stated as uncontrolled   . Allergy   . Arthritis   . Gout   . Vitamin D deficiency   . Chronic diastolic CHF (congestive heart failure), NYHA class 1 ([place])   . Hypertension   . Paroxysmal atrial fibrillation (HCC)     s/p TEE/DCCV  . CKD (chronic kidney disease), stage III   . GERD (gastroesophageal reflux disease)   . Anxiety     mild  . Carotid artery stenosis     underwent right carotid endarterectomy with bovine pericardial patch angioplasty for an 80% lesion and 40-59% left carotid stenosis  . Pulmonary HTN ([place])     mild     Past Surgical History  Procedure Laterality Date  . Joint replacement    . Dilitation and currage    . Vein surgery    . Cataract extraction    . Tee without cardioversion N/A 06/17/2014    Procedure: TRANSESOPHAGEAL ECHOCARDIOGRAM (TEE);  Surgeon: Fay Records, MD;  Location: [facility] ENDOSCOPY;   Service: Cardiovascular;  Laterality: N/A;  . Cardioversion N/A 06/17/2014    Procedure: CARDIOVERSION;  Surgeon: Fay Records, MD;  Location: [facility];  Service: Cardiovascular;  Laterality: N/A;  . Peripheral vascular catheterization N/A 08/17/2014    Procedure: Carotid Angiography;  Surgeon: Serafina Mitchell, MD;  Location: [facility];  Service: Cardiovascular;  Laterality: N/A;  . Endarterectomy  08/17/2014  . Endarterectomy Right 08/18/2014    Procedure: ENDARTERECTOMY CAROTID;  Surgeon: Serafina Mitchell, MD;  Location: [facility] OR;  Service: Vascular;  Laterality: Right;  . Patch angioplasty Right 08/18/2014    Procedure: PATCH ANGIOPLASTY USING XENOSURE BIOLOGIC PATCH 1CM X 6CM;  Surgeon: Serafina Mitchell, MD;  Location: [facility] OR;  Service: Vascular;  Laterality: Right;    Current Medications: Outpatient Prescriptions Prior to Visit  Medication Sig Dispense Refill  . allopurinol (ZYLOPRIM) 300 MG tablet TAKE 1 TABLET BY MOUTH ONCE DAILY TO PREVENT GOUT 90 tablet 2  . ALPRAZolam (XANAX) 1 MG tablet TAKE 1/2 OR 1 TABLET BY MOUTH AT BEDTIME AS NEEDED FOR SLEEP 30 tablet 3  . cholecalciferol (VITAMIN D) 1000 UNITS tablet Take 4,000 Units by mouth every morning.     . diltiazem (CARDIZEM CD) 240 MG 24 hr capsule TAKE ONE CAPSULE BY MOUTH EVERY DAY 90 capsule 0  . ELIQUIS  2.5 MG TABS tablet TAKE 1 TABLET BY MOUTH TWICE A DAY 60 tablet 9  . Flaxseed, Linseed, (FLAX SEED OIL) 1000 MG CAPS Take 1 capsule by mouth every morning.    . furosemide (LASIX) 40 MG tablet Take 20 mg by mouth daily.  6  . gabapentin (NEURONTIN) 300 MG capsule TAKE ONE CAPSULE BY MOUTH EVERY DAY AT BEDTIME 90 capsule 4  . gemfibrozil (LOPID) 600 MG tablet TAKE 1 TABLET BY MOUTH TWICE A DAY WITH FOOD FOR CHOLESTEROL 180 tablet 2  . glucose blood (FREESTYLE LITE) test strip USE ONCE A DAY OR AS DIRECTED FOR FLUCTUATING BLOOD SUGARS 100 each 3  . Lancets (FREESTYLE) lancets CHECK BLOOD GLUCOSE 1 TIME DAILY. DX-E11.22 100 each  3  . losartan (COZAAR) 25 MG tablet Take 1 tablet (25 mg total) by mouth daily. 90 tablet 3  . magnesium oxide (MAG-OX) 400 MG tablet Take 400 mg by mouth daily.    . metoprolol (LOPRESSOR) 50 MG tablet Take 1 tablet (50 mg total) by mouth 2 (two) times daily. 180 tablet 3  . omeprazole (PRILOSEC) 20 MG capsule TAKE ONE CAPSULE EVERY MORNING FOR ACID 90 capsule 1  . ranitidine (ZANTAC) 300 MG tablet Take 1 tablet (300 mg total) by mouth as needed for heartburn. 60 tablet 3  . vitamin B-12 (CYANOCOBALAMIN) 1000 MCG tablet Take 1,000 mcg by mouth every morning.    . vitamin C (ASCORBIC ACID) 500 MG tablet Take 500 mg by mouth 2 (two) times daily.      No facility-administered medications prior to visit.     Allergies:   Codeine; Lisinopril; and Tylenol pm extra   Social History   Social History  . Marital Status: Widowed    Spouse Name: N/A  . Number of Children: N/A  . Years of Education: N/A   Social History Main Topics  . Smoking status: Never Smoker   . Smokeless tobacco: Never Used  . Alcohol Use: No  . Drug Use: No  . Sexual Activity: No   Other Topics Concern  . None   Social History Narrative     Family History:  The patient's family history includes Asthma in her father and son; Diabetes in her brother; Heart attack in her father; Hyperlipidemia in her father; Hypertension in her father, mother, and son; Sleep apnea in her daughter; Stroke in her mother.   ROS:   Please see the history of present illness.    ROS All other systems reviewed and are negative.   PHYSICAL EXAM:   VS:  BP 172/80 mmHg  Pulse 63  Ht 5\' 4"  (1.626 m)  Wt 190 lb 1.9 oz (86.238 kg)  BMI 32.62 kg/m2   GEN: Well nourished, well developed, in no acute distress HEENT: normal Neck: no JVD, carotid bruits, or masses Cardiac: RRR; no murmurs, rubs, or gallops,no edema.  Intact distal pulses bilaterally.  Respiratory:  clear to auscultation bilaterally, normal work of breathing GI: soft,  nontender, nondistended, + BS MS: no deformity or atrophy Skin: warm and dry, no rash Neuro:  Alert and Oriented x 3, Strength and sensation are intact Psych: euthymic mood, full affect  Wt Readings from Last 3 Encounters:  05/09/15 190 lb 1.9 oz (86.238 kg)  03/31/15 191 lb (86.637 kg)  12/21/14 186 lb (84.369 kg)      Studies/Labs Reviewed:   EKG:  EKG is not ordered today.    Recent Labs: 06/02/2014: B Natriuretic Peptide 520.6* 06/09/2014: Pro B Natriuretic peptide (  BNP) 233.0* 03/31/2015: ALT 9; BUN 59*; Creat 1.64*; Hemoglobin 12.3; Magnesium 2.1; Platelets 231; Potassium 4.5; Sodium 137; TSH 0.78   Lipid Panel    Component Value Date/Time   CHOL 185 03/31/2015 1101   TRIG 156* 03/31/2015 1101   HDL 56 03/31/2015 1101   CHOLHDL 3.3 03/31/2015 1101   VLDL 31* 03/31/2015 1101   LDLCALC 98 03/31/2015 1101   LDLDIRECT 100.8 02/14/2014 0930    Additional studies/ records that were reviewed today include: Labs from 03/31/2015    ASSESSMENT:    1. Chronic diastolic CHF (congestive heart failure), NYHA class 1 ([place])   2. PAF (paroxysmal atrial fibrillation) (HCC)   3. Carotid artery stenosis, unspecified laterality   4. Essential hypertension   5. Mixed hyperlipidemia      PLAN:  In order of problems listed above:  1. Diastolic CHF - appears well compensated and euvolemic on exam.  Continue CCB/diuretic/BB/ARB. 2. PAF maintaining NSR on BB and CCB.  Continue Apixaban.  Last creatinine 1.64 so now on apixaban 2.5mg  BID.   3. Carotid stenosis s/p right CEA and 40-59% left carotid stenosis.  Repeat dopplers in 07/2015 at her new cardiologist in [place].  No ASA due to NOAC.  4. Hyperlipidemia with LDL goal < 70.  Last LDL in February above goal at 98.  She is statin intolerant.  Continue Lopid and add Zetia 10mg  daily.  Repeat FLP and ALT in 8 weeks.  5.  Elevated BP - this am at home it 140/60mmHg.  I have asked her to check it daily for a week and call with the  results.    Followup with me PRN as she is moving to the [place] with her son.  She will have cardiac care there  Medication Adjustments/Labs and Tests Ordered: Current medicines are reviewed at length with the patient today.  Concerns regarding medicines are outlined above.  Medication changes, Labs and Tests ordered today are listed in the Patient Instructions below. There are no Patient Instructions on file for this visit.   Lurena Nida, MD  05/09/2015 9:45 AM    [facility] [place], [place], [place]  [address] Phone: [phone]; Fax: [phone]

## 2015-05-09 ENCOUNTER — Encounter: Payer: Self-pay | Admitting: Cardiology

## 2015-05-09 ENCOUNTER — Ambulatory Visit (INDEPENDENT_AMBULATORY_CARE_PROVIDER_SITE_OTHER): Payer: Medicare Other | Admitting: Cardiology

## 2015-05-09 VITALS — BP 172/80 | HR 63 | Ht 64.0 in | Wt 190.1 lb

## 2015-05-09 DIAGNOSIS — I6529 Occlusion and stenosis of unspecified carotid artery: Secondary | ICD-10-CM

## 2015-05-09 DIAGNOSIS — I5032 Chronic diastolic (congestive) heart failure: Secondary | ICD-10-CM

## 2015-05-09 DIAGNOSIS — I480 Paroxysmal atrial fibrillation: Secondary | ICD-10-CM

## 2015-05-09 DIAGNOSIS — E782 Mixed hyperlipidemia: Secondary | ICD-10-CM

## 2015-05-09 DIAGNOSIS — I10 Essential (primary) hypertension: Secondary | ICD-10-CM

## 2015-05-09 MED ORDER — EZETIMIBE 10 MG PO TABS: 10.0000 mg | ORAL_TABLET | Freq: Every day | ORAL | Status: AC

## 2015-05-09 NOTE — Patient Instructions (Signed)
Medication Instructions:  Your physician has recommended you make the following change in your medication:  1) START ZETIA 10 mg daily   Labwork: You have been given a prescription to have FASTING labs drawn in 8 weeks.   Testing/Procedures: None  Follow-Up: Your physician recommends that you schedule a follow-up appointment AS NEEDED with Dr. Radford Pax.  Any Other Special Instructions Will Be Listed Below (If Applicable). Please check your BLOOD PRESSURE daily for one week and call with results.    If you need a refill on your cardiac medications before your next appointment, please call your pharmacy.

## 2015-05-17 ENCOUNTER — Encounter: Payer: Self-pay | Admitting: Surgery

## 2015-05-29 ENCOUNTER — Ambulatory Visit (HOSPITAL_COMMUNITY)
Admission: RE | Admit: 2015-05-29 | Discharge: 2015-05-29 | Disposition: A | Discharge status: Home / Self Care | Payer: Medicare Other | Source: Ambulatory Visit | Attending: Surgery | Admitting: Surgery

## 2015-05-29 ENCOUNTER — Ambulatory Visit (INDEPENDENT_AMBULATORY_CARE_PROVIDER_SITE_OTHER): Payer: Medicare Other | Admitting: Surgery

## 2015-05-29 ENCOUNTER — Encounter: Payer: Self-pay | Admitting: Surgery

## 2015-05-29 VITALS — BP 156/70 | HR 52 | Ht 64.0 in | Wt 189.5 lb

## 2015-05-29 DIAGNOSIS — I6521 Occlusion and stenosis of right carotid artery: Secondary | ICD-10-CM

## 2015-05-29 DIAGNOSIS — E1122 Type 2 diabetes mellitus with diabetic chronic kidney disease: Secondary | ICD-10-CM | POA: Diagnosis not present

## 2015-05-29 DIAGNOSIS — K219 Gastro-esophageal reflux disease without esophagitis: Secondary | ICD-10-CM | POA: Insufficient documentation

## 2015-05-29 DIAGNOSIS — F419 Anxiety disorder, unspecified: Secondary | ICD-10-CM | POA: Insufficient documentation

## 2015-05-29 DIAGNOSIS — I5032 Chronic diastolic (congestive) heart failure: Secondary | ICD-10-CM | POA: Diagnosis not present

## 2015-05-29 DIAGNOSIS — E785 Hyperlipidemia, unspecified: Secondary | ICD-10-CM | POA: Diagnosis not present

## 2015-05-29 DIAGNOSIS — N183 Chronic kidney disease, stage 3 (moderate): Secondary | ICD-10-CM | POA: Insufficient documentation

## 2015-05-29 DIAGNOSIS — I130 Hypertensive heart and chronic kidney disease with heart failure and stage 1 through stage 4 chronic kidney disease, or unspecified chronic kidney disease: Secondary | ICD-10-CM | POA: Diagnosis not present

## 2015-05-29 NOTE — Progress Notes (Signed)
Vascular and Vein Specialist of [facility]  Patient name: [name] [name] MRN: [id number] DOB: 14-Sep-1930 Sex: female  REASON FOR VISIT: follow up carotid  HPI: DESHANNA [name] is a 82 y.o. female  who on 08/18/2014  underwent right carotid endarterectomy with bovine pericardial patch angioplasty. She had a 80% asymptomatic lesion. She did undergo diagnostic angiography the day prior to her operation because of the high bifurcation. This also showed fibromuscular changes in her distal internal carotid artery.  The patient had an uncomplicated postoperative course. At her first postoperative visit she had multiple complaints including occasional headaches, numbness around her incision and her jaw, easy bruising, leg pain, ear pain and fatigue. All of the above postoperative complaints have resolved.    Since her last visit, she has not had any changes in her medical care and she has remained asymptomatic.  Past Medical History  Diagnosis Date  . Obesity   . Hyperlipidemia     she is statin intolerant  . Type II or unspecified type diabetes mellitus without mention of complication, not stated as uncontrolled   . Allergy   . Arthritis   . Gout   . Vitamin D deficiency   . Chronic diastolic CHF (congestive heart failure), NYHA class 1 ([place])   . Hypertension   . Paroxysmal atrial fibrillation (HCC)     s/p TEE/DCCV  . CKD (chronic kidney disease), stage III   . GERD (gastroesophageal reflux disease)   . Anxiety     mild  . Carotid artery stenosis     underwent right carotid endarterectomy with bovine pericardial patch angioplasty for an 80% lesion and 40-59% left carotid stenosis  . Pulmonary HTN (HCC)     mild     Family History  Problem Relation Age of Onset  . Hypertension Mother   . Stroke Mother   . Asthma Father   . Hypertension Father   . Hyperlipidemia Father   . Heart attack Father   . Heart disease Father     before age 53  . Diabetes Brother   . Heart  disease Brother     before age 38  . Sleep apnea Daughter   . Asthma Son   . Hypertension Son     SOCIAL HISTORY: Social History  Substance Use Topics  . Smoking status: Never Smoker   . Smokeless tobacco: Never Used  . Alcohol Use: No    Allergies  Allergen Reactions  . Codeine Other (See Comments)    "felt like things were crawling on me"  . Lisinopril Other (See Comments)    fatigue  . Tylenol Pm Extra [Diphenhydramine-Apap (Sleep)]     Current Outpatient Prescriptions  Medication Sig Dispense Refill  . allopurinol (ZYLOPRIM) 300 MG tablet TAKE 1 TABLET BY MOUTH ONCE DAILY TO PREVENT GOUT (Patient taking differently: TAKE 1/2 TABLET BY MOUTH ONCE DAILY TO PREVENT GOUT) 90 tablet 2  . ALPRAZolam (XANAX) 1 MG tablet TAKE 1/2 OR 1 TABLET BY MOUTH AT BEDTIME AS NEEDED FOR SLEEP 30 tablet 3  . cholecalciferol (VITAMIN D) 1000 UNITS tablet Take 4,000 Units by mouth every morning.     . diltiazem (CARDIZEM CD) 240 MG 24 hr capsule TAKE ONE CAPSULE BY MOUTH EVERY DAY 90 capsule 0  . ELIQUIS 2.5 MG TABS tablet TAKE 1 TABLET BY MOUTH TWICE A DAY 60 tablet 9  . ezetimibe (ZETIA) 10 MG tablet Take 1 tablet (10 mg total) by mouth daily. 30 tablet 11  . Flaxseed,  Linseed, (FLAX SEED OIL) 1000 MG CAPS Take 1 capsule by mouth every morning.    . furosemide (LASIX) 40 MG tablet Take 20 mg by mouth daily.  6  . gabapentin (NEURONTIN) 300 MG capsule TAKE ONE CAPSULE BY MOUTH EVERY DAY AT BEDTIME 90 capsule 4  . gemfibrozil (LOPID) 600 MG tablet TAKE 1 TABLET BY MOUTH TWICE A DAY WITH FOOD FOR CHOLESTEROL 180 tablet 2  . glucose blood (FREESTYLE LITE) test strip USE ONCE A DAY OR AS DIRECTED FOR FLUCTUATING BLOOD SUGARS 100 each 3  . Lancets (FREESTYLE) lancets CHECK BLOOD GLUCOSE 1 TIME DAILY. DX-E11.22 100 each 3  . losartan (COZAAR) 25 MG tablet Take 1 tablet (25 mg total) by mouth daily. 90 tablet 3  . magnesium oxide (MAG-OX) 400 MG tablet Take 400 mg by mouth daily.    . metoprolol  (LOPRESSOR) 50 MG tablet Take 1 tablet (50 mg total) by mouth 2 (two) times daily. 180 tablet 3  . omeprazole (PRILOSEC) 20 MG capsule TAKE ONE CAPSULE EVERY MORNING FOR ACID 90 capsule 1  . ranitidine (ZANTAC) 300 MG tablet Take 1 tablet (300 mg total) by mouth as needed for heartburn. 60 tablet 3  . vitamin B-12 (CYANOCOBALAMIN) 1000 MCG tablet Take 1,000 mcg by mouth every morning.    . vitamin C (ASCORBIC ACID) 500 MG tablet Take 500 mg by mouth 2 (two) times daily.      No current facility-administered medications for this visit.    REVIEW OF SYSTEMS:  [X]  denotes positive finding, [ ]  denotes negative finding Cardiac  Comments:  Chest pain or chest pressure: x   Shortness of breath upon exertion: x   Short of breath when lying flat:    Irregular heart rhythm: x       Vascular    Pain in calf, thigh, or hip brought on by ambulation:    Pain in feet at night that wakes you up from your sleep:  x   Blood clot in your veins:    Leg swelling:  x       Pulmonary    Oxygen at home:    Productive cough:     Wheezing:         Neurologic    Sudden weakness in arms or legs:     Sudden numbness in arms or legs:  x tingling  Sudden onset of difficulty speaking or slurred speech:    Temporary loss of vision in one eye:     Problems with dizziness:         Gastrointestinal    Blood in stool:     Vomited blood:         Genitourinary    Burning when urinating:     Blood in urine:        Psychiatric    Major depression:         Hematologic    Bleeding problems:    Problems with blood clotting too easily:        Skin    Rashes or ulcers:        Constitutional    Fever or chills:      PHYSICAL EXAM: Filed Vitals:   05/29/15 1002 05/29/15 1006  BP: 143/72 156/70  Pulse: 52   Height: 5\' 4"  (1.626 m)   Weight: 189 lb 8 oz (85.957 kg)   SpO2: 97%     GENERAL: The patient is a well-nourished female, in no acute distress. The vital signs  are documented above. CARDIAC:  There is a regular rate and rhythm.  VASCULAR: no carotid bruits PULMONARY: There is good air exchange bilaterally without wheezing or rales. ABDOMEN: Soft and non-tender with normal pitched bowel sounds.  MUSCULOSKELETAL: There are no major deformities or cyanosis. NEUROLOGIC: No focal weakness or paresthesias are detected. SKIN: There are no ulcers or rashes noted. PSYCHIATRIC: The patient has a normal affect.  DATA:  I have reviewed her vascular lab study from today.  This is consistent with 1-39 percent stenosis on the left and a widely patent right carotid endarterectomy site  MEDICAL ISSUES: Patient has done very well and has recovered nicely.  Ultrasound shows no significant stenosis.  She is moving to [place] and will get future surveillance at her new residence.    Annamarie Major V[facility] and Ve[facility] Specialists of [employer]: [phone]

## 2015-06-03 ENCOUNTER — Other Ambulatory Visit: Payer: Self-pay | Admitting: Physician Assistant

## 2015-06-11 ENCOUNTER — Other Ambulatory Visit: Payer: Self-pay | Admitting: Internal Medicine

## 2015-06-26 DIAGNOSIS — I5032 Chronic diastolic (congestive) heart failure: Secondary | ICD-10-CM | POA: Diagnosis not present

## 2015-06-26 DIAGNOSIS — N390 Urinary tract infection, site not specified: Secondary | ICD-10-CM | POA: Diagnosis not present

## 2015-06-26 DIAGNOSIS — E559 Vitamin D deficiency, unspecified: Secondary | ICD-10-CM | POA: Diagnosis not present

## 2015-06-26 DIAGNOSIS — E118 Type 2 diabetes mellitus with unspecified complications: Secondary | ICD-10-CM | POA: Diagnosis not present

## 2015-06-26 DIAGNOSIS — I10 Essential (primary) hypertension: Secondary | ICD-10-CM | POA: Diagnosis not present

## 2015-06-26 DIAGNOSIS — E785 Hyperlipidemia, unspecified: Secondary | ICD-10-CM | POA: Diagnosis not present

## 2015-06-26 DIAGNOSIS — N183 Chronic kidney disease, stage 3 (moderate): Secondary | ICD-10-CM | POA: Diagnosis not present

## 2015-06-26 DIAGNOSIS — Z9889 Other specified postprocedural states: Secondary | ICD-10-CM | POA: Diagnosis not present

## 2015-06-28 ENCOUNTER — Encounter: Payer: Self-pay | Admitting: Internal Medicine

## 2015-06-28 ENCOUNTER — Ambulatory Visit (INDEPENDENT_AMBULATORY_CARE_PROVIDER_SITE_OTHER): Payer: Medicare Other | Admitting: Internal Medicine

## 2015-06-28 VITALS — BP 126/78 | HR 60 | Temp 98.0°F | Resp 16 | Ht 64.0 in | Wt 182.0 lb

## 2015-06-28 DIAGNOSIS — N183 Chronic kidney disease, stage 3 unspecified: Secondary | ICD-10-CM

## 2015-06-28 DIAGNOSIS — Z79899 Other long term (current) drug therapy: Secondary | ICD-10-CM | POA: Diagnosis not present

## 2015-06-28 DIAGNOSIS — E782 Mixed hyperlipidemia: Secondary | ICD-10-CM | POA: Diagnosis not present

## 2015-06-28 DIAGNOSIS — E1122 Type 2 diabetes mellitus with diabetic chronic kidney disease: Secondary | ICD-10-CM | POA: Diagnosis not present

## 2015-06-28 DIAGNOSIS — E559 Vitamin D deficiency, unspecified: Secondary | ICD-10-CM | POA: Diagnosis not present

## 2015-06-28 DIAGNOSIS — A09 Infectious gastroenteritis and colitis, unspecified: Secondary | ICD-10-CM

## 2015-06-28 DIAGNOSIS — R197 Diarrhea, unspecified: Secondary | ICD-10-CM

## 2015-06-28 DIAGNOSIS — I10 Essential (primary) hypertension: Secondary | ICD-10-CM | POA: Diagnosis not present

## 2015-06-28 NOTE — Progress Notes (Signed)
Patient ID: [name] [name], female   DOB: [birth year], 82 y.o.   MRN: [id number] Assessment and Plan:  Hypertension:  -Continue medication,  -monitor blood pressure at home.  -Continue DASH diet.   -Reminder to go to the ER if any CP, SOB, nausea, dizziness, severe HA, changes vision/speech, left arm numbness and tingling, and jaw pain.  Cholesterol: -Continue diet and exercise.  -Check cholesterol.   Pre-diabetes: -Continue diet and exercise.  -Check A1C  Vitamin D Def: -check level -continue medications.   Diarrhea -immodium -GI Pathogen panel -at high risk for C. Diff due to omeprazole use long term  Continue diet and meds as discussed. Further disposition pending results of labs.  HPI 82 y.o. female  presents for 3 month follow up with hypertension, hyperlipidemia, prediabetes and vitamin D.   Her blood pressure has been controlled at home, today their BP is BP: 126/78 mmHg.   She does not workout. She denies chest pain, shortness of breath, dizziness.   She is on cholesterol medication and denies myalgias. Her cholesterol is at goal. The cholesterol last visit was:   Lab Results  Component Value Date   CHOL 185 03/31/2015   HDL 56 03/31/2015   LDLCALC 98 03/31/2015   LDLDIRECT 100.8 02/14/2014   TRIG 156* 03/31/2015   CHOLHDL 3.3 03/31/2015     She has been working on diet and exercise for prediabetes, and denies foot ulcerations, hyperglycemia, hypoglycemia , increased appetite, nausea, paresthesia of the feet, polydipsia, polyuria, visual disturbances, vomiting and weight loss. Last A1C in the office was:  Lab Results  Component Value Date   HGBA1C 6.3* 03/31/2015    Patient is on Vitamin D supplement.  Lab Results  Component Value Date   VD25OH 51 03/31/2015     She notes that for the past 3-4 weeks she has been having some increasing diarrhea.  She notes that she is going several times a day.  She reports that immodium has helped once.  She reports that she  does have some cramping right before she goes.  She has not been on any antibiotics recently.  She reports that the last time she was on an antibiotic was months ago.  She is moving in with her son and has been packing to move.  She has been eating a lot of lean cuisine.    She did fall on Sunday.  She hit both her knees and also hit her left chest on the floor.  She does not have any shortness of breath.  She is very sore.  She was not using her walker.     Current Medications:  Current Outpatient Prescriptions on File Prior to Visit  Medication Sig Dispense Refill  . allopurinol (ZYLOPRIM) 300 MG tablet TAKE 1 TABLET BY MOUTH ONCE DAILY TO PREVENT GOUT (Patient taking differently: TAKE 1/2 TABLET BY MOUTH ONCE DAILY TO PREVENT GOUT) 90 tablet 2  . ALPRAZolam (XANAX) 1 MG tablet TAKE 1/2 OR 1 TABLET BY MOUTH AT BEDTIME AS NEEDED FOR SLEEP 30 tablet 3  . cholecalciferol (VITAMIN D) 1000 UNITS tablet Take 4,000 Units by mouth every morning.     . diltiazem (CARDIZEM CD) 240 MG 24 hr capsule TAKE ONE CAPSULE BY MOUTH EVERY DAY 90 capsule 0  . ELIQUIS 2.5 MG TABS tablet TAKE 1 TABLET BY MOUTH TWICE A DAY 60 tablet 9  . ezetimibe (ZETIA) 10 MG tablet Take 1 tablet (10 mg total) by mouth daily. 30 tablet 11  . Flaxseed,  Linseed, (FLAX SEED OIL) 1000 MG CAPS Take 1 capsule by mouth every morning.    . furosemide (LASIX) 40 MG tablet Take 20 mg by mouth daily.  6  . gabapentin (NEURONTIN) 300 MG capsule TAKE ONE CAPSULE BY MOUTH EVERY DAY AT BEDTIME 90 capsule 1  . gemfibrozil (LOPID) 600 MG tablet TAKE 1 TABLET BY MOUTH TWICE A DAY WITH FOOD FOR CHOLESTEROL 180 tablet 2  . glucose blood (FREESTYLE LITE) test strip USE ONCE A DAY OR AS DIRECTED FOR FLUCTUATING BLOOD SUGARS 100 each 3  . Lancets (FREESTYLE) lancets CHECK BLOOD GLUCOSE 1 TIME DAILY. DX-E11.22 100 each 3  . losartan (COZAAR) 25 MG tablet Take 1 tablet (25 mg total) by mouth daily. 90 tablet 3  . magnesium oxide (MAG-OX) 400 MG tablet Take  400 mg by mouth daily.    . metoprolol (LOPRESSOR) 50 MG tablet Take 1 tablet (50 mg total) by mouth 2 (two) times daily. 180 tablet 3  . omeprazole (PRILOSEC) 20 MG capsule TAKE ONE CAPSULE EVERY MORNING FOR ACID 90 capsule 1  . ranitidine (ZANTAC) 300 MG tablet Take 1 tablet (300 mg total) by mouth as needed for heartburn. 60 tablet 3  . vitamin B-12 (CYANOCOBALAMIN) 1000 MCG tablet Take 1,000 mcg by mouth every morning.    . vitamin C (ASCORBIC ACID) 500 MG tablet Take 500 mg by mouth 2 (two) times daily.      No current facility-administered medications on file prior to visit.    Medical History:  Past Medical History  Diagnosis Date  . Obesity   . Hyperlipidemia     she is statin intolerant  . Type II or unspecified type diabetes mellitus without mention of complication, not stated as uncontrolled   . Allergy   . Arthritis   . Gout   . Vitamin D deficiency   . Chronic diastolic CHF (congestive heart failure), NYHA class 1 ([place])   . Hypertension   . Paroxysmal atrial fibrillation (HCC)     s/p TEE/DCCV  . CKD (chronic kidney disease), stage III   . GERD (gastroesophageal reflux disease)   . Anxiety     mild  . Carotid artery stenosis     underwent right carotid endarterectomy with bovine pericardial patch angioplasty for an 80% lesion and 40-59% left carotid stenosis  . Pulmonary HTN (HCC)     mild     Allergies:  Allergies  Allergen Reactions  . Codeine Other (See Comments)    "felt like things were crawling on me"  . Lisinopril Other (See Comments)    fatigue  . Tylenol Pm Extra [Diphenhydramine-Apap (Sleep)]      Review of Systems:  Review of Systems  Constitutional: Negative for fever, chills and malaise/fatigue.  HENT: Negative for congestion, ear pain and sore throat.   Eyes: Negative.   Respiratory: Negative for cough, shortness of breath and wheezing.   Cardiovascular: Negative for chest pain, palpitations and leg swelling.  Gastrointestinal: Positive  for diarrhea. Negative for heartburn, abdominal pain, constipation, blood in stool and melena.  Genitourinary: Negative.   Skin: Negative.   Neurological: Negative for dizziness, sensory change, loss of consciousness and headaches.  Psychiatric/Behavioral: Negative for depression. The patient is not nervous/anxious and does not have insomnia.     Family history- Review and unchanged  Social history- Review and unchanged  Physical Exam: BP 126/78 mmHg  Pulse 60  Temp(Src) 98 F (36.7 C) (Temporal)  Resp 16  Ht 5\' 4"  (1.626 m)  Wt 182 lb (82.555 kg)  BMI 31.22 kg/m2 Wt Readings from Last 3 Encounters:  06/28/15 182 lb (82.555 kg)  05/29/15 189 lb 8 oz (85.957 kg)  05/09/15 190 lb 1.9 oz (86.238 kg)    General Appearance: Well nourished well developed, in no apparent distress. Eyes: PERRLA, EOMs, conjunctiva no swelling or erythema ENT/Mouth: Ear canals normal without obstruction, swelling, erythma, discharge.  TMs normal bilaterally.  Oropharynx moist, clear, without exudate, or postoropharyngeal swelling. Neck: Supple, thyroid normal,no cervical adenopathy  Respiratory: Respiratory effort normal, Breath sounds clear A&P without rhonchi, wheeze, or rale.  No retractions, no accessory usage. Cardio: RRR with no MRGs. Brisk peripheral pulses without edema.  Abdomen: Soft, + BS,  Non tender, no guarding, rebound, hernias, masses. Musculoskeletal: Full ROM, 5/5 strength, Normal gait Skin: Warm, dry without rashes, lesions, ecchymosis.  Neuro: Awake and oriented X 3, Cranial nerves intact. Normal muscle tone, no cerebellar symptoms. Psych: Normal affect, Insight and Judgment appropriate.    Starlyn Skeans, PA-C 2:14 PM [facility]

## 2015-06-28 NOTE — Patient Instructions (Signed)

## 2015-06-29 DIAGNOSIS — M25561 Pain in right knee: Secondary | ICD-10-CM | POA: Diagnosis not present

## 2015-06-29 DIAGNOSIS — A09 Infectious gastroenteritis and colitis, unspecified: Secondary | ICD-10-CM | POA: Diagnosis not present

## 2015-06-29 DIAGNOSIS — M25562 Pain in left knee: Secondary | ICD-10-CM | POA: Diagnosis not present

## 2015-06-29 DIAGNOSIS — S8002XA Contusion of left knee, initial encounter: Secondary | ICD-10-CM | POA: Diagnosis not present

## 2015-06-29 DIAGNOSIS — S8001XA Contusion of right knee, initial encounter: Secondary | ICD-10-CM | POA: Diagnosis not present

## 2015-06-30 ENCOUNTER — Ambulatory Visit: Payer: Self-pay | Admitting: Physician Assistant

## 2015-06-30 LAB — GASTROINTESTINAL PATHOGEN PANEL PCR
C. DIFFICILE TOX A/B, PCR: NOT DETECTED
CRYPTOSPORIDIUM, PCR: NOT DETECTED
Campylobacter, PCR: NOT DETECTED
E COLI (ETEC) LT/ST, PCR: NOT DETECTED
E coli (STEC) stx1/stx2, PCR: NOT DETECTED
E coli 0157, PCR: NOT DETECTED
GIARDIA LAMBLIA, PCR: NOT DETECTED
NOROVIRUS, PCR: NOT DETECTED
Rotavirus A, PCR: NOT DETECTED
Salmonella, PCR: NOT DETECTED
Shigella, PCR: NOT DETECTED

## 2015-07-03 DIAGNOSIS — E1122 Type 2 diabetes mellitus with diabetic chronic kidney disease: Secondary | ICD-10-CM | POA: Diagnosis not present

## 2015-07-03 DIAGNOSIS — K529 Noninfective gastroenteritis and colitis, unspecified: Secondary | ICD-10-CM | POA: Diagnosis not present

## 2015-07-03 DIAGNOSIS — N189 Chronic kidney disease, unspecified: Secondary | ICD-10-CM | POA: Diagnosis not present

## 2015-07-03 DIAGNOSIS — I129 Hypertensive chronic kidney disease with stage 1 through stage 4 chronic kidney disease, or unspecified chronic kidney disease: Secondary | ICD-10-CM | POA: Diagnosis not present

## 2015-07-04 ENCOUNTER — Other Ambulatory Visit: Payer: Self-pay | Admitting: *Deleted

## 2015-07-04 DIAGNOSIS — I6523 Occlusion and stenosis of bilateral carotid arteries: Secondary | ICD-10-CM

## 2015-07-13 DIAGNOSIS — I129 Hypertensive chronic kidney disease with stage 1 through stage 4 chronic kidney disease, or unspecified chronic kidney disease: Secondary | ICD-10-CM | POA: Diagnosis not present

## 2015-07-13 DIAGNOSIS — N183 Chronic kidney disease, stage 3 (moderate): Secondary | ICD-10-CM | POA: Diagnosis not present

## 2015-07-20 DIAGNOSIS — M4806 Spinal stenosis, lumbar region: Secondary | ICD-10-CM | POA: Diagnosis not present

## 2015-07-20 DIAGNOSIS — I480 Paroxysmal atrial fibrillation: Secondary | ICD-10-CM | POA: Diagnosis not present

## 2015-07-20 DIAGNOSIS — M545 Low back pain: Secondary | ICD-10-CM | POA: Diagnosis not present

## 2015-07-20 DIAGNOSIS — I5032 Chronic diastolic (congestive) heart failure: Secondary | ICD-10-CM | POA: Diagnosis not present

## 2015-07-20 DIAGNOSIS — I10 Essential (primary) hypertension: Secondary | ICD-10-CM | POA: Diagnosis not present

## 2015-07-20 DIAGNOSIS — I4891 Unspecified atrial fibrillation: Secondary | ICD-10-CM | POA: Diagnosis not present

## 2015-07-22 DIAGNOSIS — M5127 Other intervertebral disc displacement, lumbosacral region: Secondary | ICD-10-CM | POA: Diagnosis not present

## 2015-07-22 DIAGNOSIS — M4806 Spinal stenosis, lumbar region: Secondary | ICD-10-CM | POA: Diagnosis not present

## 2015-07-27 ENCOUNTER — Other Ambulatory Visit: Payer: Self-pay | Admitting: *Deleted

## 2015-07-27 MED ORDER — GLUCOSE BLOOD VI STRP: ORAL_STRIP | Status: AC

## 2015-07-27 MED ORDER — FREESTYLE LANCETS MISC: Status: AC

## 2015-08-10 DIAGNOSIS — M4806 Spinal stenosis, lumbar region: Secondary | ICD-10-CM | POA: Diagnosis not present

## 2015-08-14 DIAGNOSIS — R0989 Other specified symptoms and signs involving the circulatory and respiratory systems: Secondary | ICD-10-CM | POA: Diagnosis not present

## 2015-08-15 DIAGNOSIS — M1A0720 Idiopathic chronic gout, left ankle and foot, without tophus (tophi): Secondary | ICD-10-CM | POA: Diagnosis not present

## 2015-08-15 DIAGNOSIS — I480 Paroxysmal atrial fibrillation: Secondary | ICD-10-CM | POA: Diagnosis not present

## 2015-08-15 DIAGNOSIS — I10 Essential (primary) hypertension: Secondary | ICD-10-CM | POA: Diagnosis not present

## 2015-08-15 DIAGNOSIS — R7303 Prediabetes: Secondary | ICD-10-CM | POA: Diagnosis not present

## 2015-08-15 DIAGNOSIS — N183 Chronic kidney disease, stage 3 (moderate): Secondary | ICD-10-CM | POA: Diagnosis not present

## 2015-08-29 DIAGNOSIS — F5101 Primary insomnia: Secondary | ICD-10-CM | POA: Diagnosis not present

## 2015-09-11 DIAGNOSIS — M7731 Calcaneal spur, right foot: Secondary | ICD-10-CM | POA: Diagnosis not present

## 2015-09-11 DIAGNOSIS — M79671 Pain in right foot: Secondary | ICD-10-CM | POA: Diagnosis not present

## 2015-09-11 DIAGNOSIS — M19072 Primary osteoarthritis, left ankle and foot: Secondary | ICD-10-CM | POA: Diagnosis not present

## 2015-09-11 DIAGNOSIS — M79672 Pain in left foot: Secondary | ICD-10-CM | POA: Diagnosis not present

## 2015-10-15 ENCOUNTER — Other Ambulatory Visit: Payer: Self-pay | Admitting: Internal Medicine

## 2015-10-23 ENCOUNTER — Other Ambulatory Visit: Payer: Self-pay | Admitting: Cardiology

## 2015-11-15 DIAGNOSIS — R300 Dysuria: Secondary | ICD-10-CM | POA: Diagnosis not present

## 2015-11-15 DIAGNOSIS — M545 Low back pain: Secondary | ICD-10-CM | POA: Diagnosis not present

## 2015-11-15 DIAGNOSIS — R739 Hyperglycemia, unspecified: Secondary | ICD-10-CM | POA: Diagnosis not present

## 2015-11-15 DIAGNOSIS — E782 Mixed hyperlipidemia: Secondary | ICD-10-CM | POA: Diagnosis not present

## 2015-11-15 DIAGNOSIS — F5101 Primary insomnia: Secondary | ICD-10-CM | POA: Diagnosis not present

## 2015-11-15 DIAGNOSIS — R7303 Prediabetes: Secondary | ICD-10-CM | POA: Diagnosis not present

## 2015-11-15 DIAGNOSIS — N183 Chronic kidney disease, stage 3 (moderate): Secondary | ICD-10-CM | POA: Diagnosis not present

## 2015-11-15 DIAGNOSIS — Z23 Encounter for immunization: Secondary | ICD-10-CM | POA: Diagnosis not present

## 2015-11-15 DIAGNOSIS — I10 Essential (primary) hypertension: Secondary | ICD-10-CM | POA: Diagnosis not present

## 2015-11-22 ENCOUNTER — Other Ambulatory Visit: Payer: Self-pay | Admitting: Internal Medicine

## 2015-11-29 DIAGNOSIS — Z961 Presence of intraocular lens: Secondary | ICD-10-CM | POA: Diagnosis not present

## 2015-11-29 DIAGNOSIS — Z9849 Cataract extraction status, unspecified eye: Secondary | ICD-10-CM | POA: Diagnosis not present

## 2015-11-29 DIAGNOSIS — H5211 Myopia, right eye: Secondary | ICD-10-CM | POA: Diagnosis not present

## 2015-11-29 DIAGNOSIS — H5202 Hypermetropia, left eye: Secondary | ICD-10-CM | POA: Diagnosis not present

## 2015-11-29 DIAGNOSIS — E119 Type 2 diabetes mellitus without complications: Secondary | ICD-10-CM | POA: Diagnosis not present

## 2015-11-29 DIAGNOSIS — H52223 Regular astigmatism, bilateral: Secondary | ICD-10-CM | POA: Diagnosis not present

## 2015-11-29 DIAGNOSIS — Z7984 Long term (current) use of oral hypoglycemic drugs: Secondary | ICD-10-CM | POA: Diagnosis not present

## 2015-12-18 DIAGNOSIS — E782 Mixed hyperlipidemia: Secondary | ICD-10-CM | POA: Diagnosis not present

## 2015-12-22 ENCOUNTER — Other Ambulatory Visit: Payer: Self-pay | Admitting: Internal Medicine

## 2015-12-22 ENCOUNTER — Other Ambulatory Visit: Payer: Self-pay | Admitting: Cardiology

## 2016-01-09 DIAGNOSIS — N183 Chronic kidney disease, stage 3 (moderate): Secondary | ICD-10-CM | POA: Diagnosis not present

## 2016-01-09 DIAGNOSIS — I129 Hypertensive chronic kidney disease with stage 1 through stage 4 chronic kidney disease, or unspecified chronic kidney disease: Secondary | ICD-10-CM | POA: Diagnosis not present

## 2016-01-16 DIAGNOSIS — F5101 Primary insomnia: Secondary | ICD-10-CM | POA: Diagnosis not present

## 2016-01-16 DIAGNOSIS — Z0000 Encounter for general adult medical examination without abnormal findings: Secondary | ICD-10-CM | POA: Diagnosis not present

## 2016-01-16 DIAGNOSIS — I10 Essential (primary) hypertension: Secondary | ICD-10-CM | POA: Diagnosis not present

## 2016-01-16 DIAGNOSIS — I480 Paroxysmal atrial fibrillation: Secondary | ICD-10-CM | POA: Diagnosis not present

## 2016-02-21 DIAGNOSIS — I480 Paroxysmal atrial fibrillation: Secondary | ICD-10-CM | POA: Diagnosis not present

## 2016-02-23 ENCOUNTER — Other Ambulatory Visit: Payer: Self-pay | Admitting: Cardiology

## 2016-03-05 DIAGNOSIS — J069 Acute upper respiratory infection, unspecified: Secondary | ICD-10-CM | POA: Diagnosis not present

## 2016-03-05 DIAGNOSIS — B9789 Other viral agents as the cause of diseases classified elsewhere: Secondary | ICD-10-CM | POA: Diagnosis not present

## 2016-03-13 ENCOUNTER — Encounter: Payer: Self-pay | Admitting: Cardiology

## 2016-03-22 DIAGNOSIS — R222 Localized swelling, mass and lump, trunk: Secondary | ICD-10-CM | POA: Diagnosis not present

## 2016-03-22 DIAGNOSIS — J9811 Atelectasis: Secondary | ICD-10-CM | POA: Diagnosis not present

## 2016-03-22 DIAGNOSIS — I5032 Chronic diastolic (congestive) heart failure: Secondary | ICD-10-CM | POA: Diagnosis not present

## 2016-03-22 DIAGNOSIS — I480 Paroxysmal atrial fibrillation: Secondary | ICD-10-CM | POA: Diagnosis not present

## 2016-03-22 DIAGNOSIS — I10 Essential (primary) hypertension: Secondary | ICD-10-CM | POA: Diagnosis not present

## 2016-03-29 DIAGNOSIS — I480 Paroxysmal atrial fibrillation: Secondary | ICD-10-CM | POA: Diagnosis not present

## 2016-03-29 DIAGNOSIS — I5032 Chronic diastolic (congestive) heart failure: Secondary | ICD-10-CM | POA: Diagnosis not present

## 2016-03-29 DIAGNOSIS — I10 Essential (primary) hypertension: Secondary | ICD-10-CM | POA: Diagnosis not present

## 2016-04-01 ENCOUNTER — Other Ambulatory Visit: Payer: Self-pay | Admitting: Internal Medicine

## 2016-04-18 DIAGNOSIS — N183 Chronic kidney disease, stage 3 (moderate): Secondary | ICD-10-CM | POA: Diagnosis not present

## 2016-04-18 DIAGNOSIS — I10 Essential (primary) hypertension: Secondary | ICD-10-CM | POA: Diagnosis not present

## 2016-04-18 DIAGNOSIS — I480 Paroxysmal atrial fibrillation: Secondary | ICD-10-CM | POA: Diagnosis not present

## 2016-05-01 ENCOUNTER — Encounter: Payer: Self-pay | Admitting: Internal Medicine

## 2016-05-09 DIAGNOSIS — I10 Essential (primary) hypertension: Secondary | ICD-10-CM | POA: Diagnosis not present

## 2016-05-09 DIAGNOSIS — I480 Paroxysmal atrial fibrillation: Secondary | ICD-10-CM | POA: Diagnosis not present

## 2016-05-09 DIAGNOSIS — I5032 Chronic diastolic (congestive) heart failure: Secondary | ICD-10-CM | POA: Diagnosis not present

## 2016-05-13 ENCOUNTER — Telehealth: Payer: Self-pay | Admitting: *Deleted

## 2016-05-13 ENCOUNTER — Other Ambulatory Visit: Payer: Self-pay | Admitting: Cardiology

## 2016-05-13 NOTE — Telephone Encounter (Signed)
Spoke with pt and she states that she no longer lives in the area that she lives in [place] and that Dr Salomon Fick does follow her Eliquis and will refill as needed  Also notified the requesting pharmacy of this as well

## 2016-05-31 DIAGNOSIS — M48062 Spinal stenosis, lumbar region with neurogenic claudication: Secondary | ICD-10-CM | POA: Diagnosis not present

## 2016-05-31 DIAGNOSIS — M545 Low back pain: Secondary | ICD-10-CM | POA: Diagnosis not present

## 2016-06-10 DIAGNOSIS — M48061 Spinal stenosis, lumbar region without neurogenic claudication: Secondary | ICD-10-CM | POA: Diagnosis not present

## 2016-06-10 DIAGNOSIS — M5127 Other intervertebral disc displacement, lumbosacral region: Secondary | ICD-10-CM | POA: Diagnosis not present

## 2016-06-10 DIAGNOSIS — M4697 Unspecified inflammatory spondylopathy, lumbosacral region: Secondary | ICD-10-CM | POA: Diagnosis not present

## 2016-06-10 DIAGNOSIS — M4807 Spinal stenosis, lumbosacral region: Secondary | ICD-10-CM | POA: Diagnosis not present

## 2016-06-10 DIAGNOSIS — M419 Scoliosis, unspecified: Secondary | ICD-10-CM | POA: Diagnosis not present

## 2016-06-10 DIAGNOSIS — M5126 Other intervertebral disc displacement, lumbar region: Secondary | ICD-10-CM | POA: Diagnosis not present

## 2016-06-10 DIAGNOSIS — M48062 Spinal stenosis, lumbar region with neurogenic claudication: Secondary | ICD-10-CM | POA: Diagnosis not present

## 2016-06-10 DIAGNOSIS — M4696 Unspecified inflammatory spondylopathy, lumbar region: Secondary | ICD-10-CM | POA: Diagnosis not present

## 2016-06-14 DIAGNOSIS — H43811 Vitreous degeneration, right eye: Secondary | ICD-10-CM | POA: Diagnosis not present

## 2016-06-20 DIAGNOSIS — M48062 Spinal stenosis, lumbar region with neurogenic claudication: Secondary | ICD-10-CM | POA: Diagnosis not present

## 2016-07-08 ENCOUNTER — Encounter (HOSPITAL_COMMUNITY): Payer: Medicare Other

## 2016-07-08 ENCOUNTER — Ambulatory Visit: Payer: Medicare Other | Admitting: Family

## 2016-07-11 DIAGNOSIS — I110 Hypertensive heart disease with heart failure: Secondary | ICD-10-CM | POA: Diagnosis not present

## 2016-07-11 DIAGNOSIS — M1990 Unspecified osteoarthritis, unspecified site: Secondary | ICD-10-CM | POA: Diagnosis not present

## 2016-07-11 DIAGNOSIS — E119 Type 2 diabetes mellitus without complications: Secondary | ICD-10-CM | POA: Diagnosis not present

## 2016-07-11 DIAGNOSIS — I509 Heart failure, unspecified: Secondary | ICD-10-CM | POA: Diagnosis not present

## 2016-07-11 DIAGNOSIS — R609 Edema, unspecified: Secondary | ICD-10-CM | POA: Diagnosis not present

## 2016-07-11 DIAGNOSIS — M79605 Pain in left leg: Secondary | ICD-10-CM | POA: Diagnosis not present

## 2016-07-11 DIAGNOSIS — Z01818 Encounter for other preprocedural examination: Secondary | ICD-10-CM | POA: Diagnosis not present

## 2016-07-11 DIAGNOSIS — I5032 Chronic diastolic (congestive) heart failure: Secondary | ICD-10-CM | POA: Diagnosis not present

## 2016-07-11 DIAGNOSIS — M545 Low back pain: Secondary | ICD-10-CM | POA: Diagnosis not present

## 2016-07-11 DIAGNOSIS — E785 Hyperlipidemia, unspecified: Secondary | ICD-10-CM | POA: Diagnosis not present

## 2016-07-11 DIAGNOSIS — M48062 Spinal stenosis, lumbar region with neurogenic claudication: Secondary | ICD-10-CM | POA: Diagnosis not present

## 2016-07-11 DIAGNOSIS — M79604 Pain in right leg: Secondary | ICD-10-CM | POA: Diagnosis not present

## 2016-07-11 DIAGNOSIS — M109 Gout, unspecified: Secondary | ICD-10-CM | POA: Diagnosis not present

## 2016-07-11 DIAGNOSIS — K219 Gastro-esophageal reflux disease without esophagitis: Secondary | ICD-10-CM | POA: Diagnosis not present

## 2016-07-19 DIAGNOSIS — I4891 Unspecified atrial fibrillation: Secondary | ICD-10-CM | POA: Diagnosis not present

## 2016-07-19 DIAGNOSIS — M5386 Other specified dorsopathies, lumbar region: Secondary | ICD-10-CM | POA: Diagnosis not present

## 2016-07-19 DIAGNOSIS — M5116 Intervertebral disc disorders with radiculopathy, lumbar region: Secondary | ICD-10-CM | POA: Diagnosis not present

## 2016-07-19 DIAGNOSIS — M4726 Other spondylosis with radiculopathy, lumbar region: Secondary | ICD-10-CM | POA: Diagnosis not present

## 2016-07-19 DIAGNOSIS — M48062 Spinal stenosis, lumbar region with neurogenic claudication: Secondary | ICD-10-CM | POA: Diagnosis not present

## 2016-07-19 DIAGNOSIS — M810 Age-related osteoporosis without current pathological fracture: Secondary | ICD-10-CM | POA: Diagnosis not present

## 2016-07-19 DIAGNOSIS — N289 Disorder of kidney and ureter, unspecified: Secondary | ICD-10-CM | POA: Diagnosis not present

## 2016-07-19 DIAGNOSIS — F419 Anxiety disorder, unspecified: Secondary | ICD-10-CM | POA: Diagnosis not present

## 2016-07-19 DIAGNOSIS — M109 Gout, unspecified: Secondary | ICD-10-CM | POA: Diagnosis not present

## 2016-07-19 DIAGNOSIS — K219 Gastro-esophageal reflux disease without esophagitis: Secondary | ICD-10-CM | POA: Diagnosis not present

## 2016-07-19 DIAGNOSIS — E785 Hyperlipidemia, unspecified: Secondary | ICD-10-CM | POA: Diagnosis not present

## 2016-07-19 DIAGNOSIS — Z7901 Long term (current) use of anticoagulants: Secondary | ICD-10-CM | POA: Diagnosis not present

## 2016-07-19 DIAGNOSIS — E119 Type 2 diabetes mellitus without complications: Secondary | ICD-10-CM | POA: Diagnosis not present

## 2016-07-19 DIAGNOSIS — I110 Hypertensive heart disease with heart failure: Secondary | ICD-10-CM | POA: Diagnosis not present

## 2016-07-19 DIAGNOSIS — M5136 Other intervertebral disc degeneration, lumbar region: Secondary | ICD-10-CM | POA: Diagnosis not present

## 2016-07-19 DIAGNOSIS — M8938 Hypertrophy of bone, other site: Secondary | ICD-10-CM | POA: Diagnosis not present

## 2016-07-19 DIAGNOSIS — I509 Heart failure, unspecified: Secondary | ICD-10-CM | POA: Diagnosis not present

## 2016-07-23 DIAGNOSIS — M48062 Spinal stenosis, lumbar region with neurogenic claudication: Secondary | ICD-10-CM | POA: Diagnosis not present

## 2016-07-23 DIAGNOSIS — I10 Essential (primary) hypertension: Secondary | ICD-10-CM | POA: Diagnosis not present

## 2016-07-23 DIAGNOSIS — I480 Paroxysmal atrial fibrillation: Secondary | ICD-10-CM | POA: Diagnosis not present

## 2016-07-29 DIAGNOSIS — I10 Essential (primary) hypertension: Secondary | ICD-10-CM | POA: Diagnosis not present

## 2016-07-29 DIAGNOSIS — I480 Paroxysmal atrial fibrillation: Secondary | ICD-10-CM | POA: Diagnosis not present

## 2016-07-29 DIAGNOSIS — I5032 Chronic diastolic (congestive) heart failure: Secondary | ICD-10-CM | POA: Diagnosis not present

## 2016-08-05 DIAGNOSIS — M545 Low back pain: Secondary | ICD-10-CM | POA: Diagnosis not present

## 2016-08-05 DIAGNOSIS — Z9889 Other specified postprocedural states: Secondary | ICD-10-CM | POA: Diagnosis not present

## 2016-08-05 DIAGNOSIS — R262 Difficulty in walking, not elsewhere classified: Secondary | ICD-10-CM | POA: Diagnosis not present

## 2016-08-09 DIAGNOSIS — Z9889 Other specified postprocedural states: Secondary | ICD-10-CM | POA: Diagnosis not present

## 2016-08-09 DIAGNOSIS — R262 Difficulty in walking, not elsewhere classified: Secondary | ICD-10-CM | POA: Diagnosis not present

## 2016-08-09 DIAGNOSIS — M545 Low back pain: Secondary | ICD-10-CM | POA: Diagnosis not present

## 2016-08-12 DIAGNOSIS — M545 Low back pain: Secondary | ICD-10-CM | POA: Diagnosis not present

## 2016-08-12 DIAGNOSIS — R262 Difficulty in walking, not elsewhere classified: Secondary | ICD-10-CM | POA: Diagnosis not present

## 2016-08-12 DIAGNOSIS — Z9889 Other specified postprocedural states: Secondary | ICD-10-CM | POA: Diagnosis not present

## 2016-08-16 DIAGNOSIS — R262 Difficulty in walking, not elsewhere classified: Secondary | ICD-10-CM | POA: Diagnosis not present

## 2016-08-16 DIAGNOSIS — Z9889 Other specified postprocedural states: Secondary | ICD-10-CM | POA: Diagnosis not present

## 2016-08-16 DIAGNOSIS — M545 Low back pain: Secondary | ICD-10-CM | POA: Diagnosis not present

## 2016-08-20 DIAGNOSIS — M545 Low back pain: Secondary | ICD-10-CM | POA: Diagnosis not present

## 2016-08-20 DIAGNOSIS — R262 Difficulty in walking, not elsewhere classified: Secondary | ICD-10-CM | POA: Diagnosis not present

## 2016-08-20 DIAGNOSIS — Z9889 Other specified postprocedural states: Secondary | ICD-10-CM | POA: Diagnosis not present

## 2016-08-22 DIAGNOSIS — I10 Essential (primary) hypertension: Secondary | ICD-10-CM | POA: Diagnosis not present

## 2016-08-22 DIAGNOSIS — F5101 Primary insomnia: Secondary | ICD-10-CM | POA: Diagnosis not present

## 2016-08-22 DIAGNOSIS — N3946 Mixed incontinence: Secondary | ICD-10-CM | POA: Diagnosis not present

## 2016-08-23 ENCOUNTER — Other Ambulatory Visit: Payer: Self-pay | Admitting: Internal Medicine

## 2016-08-23 DIAGNOSIS — M545 Low back pain: Secondary | ICD-10-CM | POA: Diagnosis not present

## 2016-08-23 DIAGNOSIS — R262 Difficulty in walking, not elsewhere classified: Secondary | ICD-10-CM | POA: Diagnosis not present

## 2016-08-23 DIAGNOSIS — Z9889 Other specified postprocedural states: Secondary | ICD-10-CM | POA: Diagnosis not present

## 2016-08-27 DIAGNOSIS — R262 Difficulty in walking, not elsewhere classified: Secondary | ICD-10-CM | POA: Diagnosis not present

## 2016-08-27 DIAGNOSIS — M545 Low back pain: Secondary | ICD-10-CM | POA: Diagnosis not present

## 2016-08-27 DIAGNOSIS — Z9889 Other specified postprocedural states: Secondary | ICD-10-CM | POA: Diagnosis not present

## 2016-08-30 DIAGNOSIS — Z9889 Other specified postprocedural states: Secondary | ICD-10-CM | POA: Diagnosis not present

## 2016-08-30 DIAGNOSIS — M545 Low back pain: Secondary | ICD-10-CM | POA: Diagnosis not present

## 2016-08-30 DIAGNOSIS — R262 Difficulty in walking, not elsewhere classified: Secondary | ICD-10-CM | POA: Diagnosis not present

## 2016-09-02 DIAGNOSIS — R262 Difficulty in walking, not elsewhere classified: Secondary | ICD-10-CM | POA: Diagnosis not present

## 2016-09-02 DIAGNOSIS — M545 Low back pain: Secondary | ICD-10-CM | POA: Diagnosis not present

## 2016-09-02 DIAGNOSIS — Z9889 Other specified postprocedural states: Secondary | ICD-10-CM | POA: Diagnosis not present

## 2016-09-04 DIAGNOSIS — N183 Chronic kidney disease, stage 3 (moderate): Secondary | ICD-10-CM | POA: Diagnosis not present

## 2016-09-04 DIAGNOSIS — I5032 Chronic diastolic (congestive) heart failure: Secondary | ICD-10-CM | POA: Diagnosis not present

## 2016-09-04 DIAGNOSIS — I10 Essential (primary) hypertension: Secondary | ICD-10-CM | POA: Diagnosis not present

## 2016-09-04 DIAGNOSIS — R7303 Prediabetes: Secondary | ICD-10-CM | POA: Diagnosis not present

## 2016-09-04 DIAGNOSIS — I480 Paroxysmal atrial fibrillation: Secondary | ICD-10-CM | POA: Diagnosis not present

## 2016-09-04 DIAGNOSIS — Z7901 Long term (current) use of anticoagulants: Secondary | ICD-10-CM | POA: Diagnosis not present

## 2016-09-05 DIAGNOSIS — R262 Difficulty in walking, not elsewhere classified: Secondary | ICD-10-CM | POA: Diagnosis not present

## 2016-09-05 DIAGNOSIS — M545 Low back pain: Secondary | ICD-10-CM | POA: Diagnosis not present

## 2016-09-05 DIAGNOSIS — Z9889 Other specified postprocedural states: Secondary | ICD-10-CM | POA: Diagnosis not present

## 2016-09-09 DIAGNOSIS — Z9889 Other specified postprocedural states: Secondary | ICD-10-CM | POA: Diagnosis not present

## 2016-09-09 DIAGNOSIS — R262 Difficulty in walking, not elsewhere classified: Secondary | ICD-10-CM | POA: Diagnosis not present

## 2016-09-09 DIAGNOSIS — M545 Low back pain: Secondary | ICD-10-CM | POA: Diagnosis not present

## 2016-09-10 DIAGNOSIS — B351 Tinea unguium: Secondary | ICD-10-CM | POA: Diagnosis not present

## 2016-09-10 DIAGNOSIS — M79671 Pain in right foot: Secondary | ICD-10-CM | POA: Diagnosis not present

## 2016-09-10 DIAGNOSIS — E1140 Type 2 diabetes mellitus with diabetic neuropathy, unspecified: Secondary | ICD-10-CM | POA: Diagnosis not present

## 2016-09-10 DIAGNOSIS — M79674 Pain in right toe(s): Secondary | ICD-10-CM | POA: Diagnosis not present

## 2016-09-10 DIAGNOSIS — M79672 Pain in left foot: Secondary | ICD-10-CM | POA: Diagnosis not present

## 2016-09-10 DIAGNOSIS — M779 Enthesopathy, unspecified: Secondary | ICD-10-CM | POA: Diagnosis not present

## 2016-09-10 DIAGNOSIS — M2040 Other hammer toe(s) (acquired), unspecified foot: Secondary | ICD-10-CM | POA: Diagnosis not present

## 2016-09-10 DIAGNOSIS — L84 Corns and callosities: Secondary | ICD-10-CM | POA: Diagnosis not present

## 2016-09-12 DIAGNOSIS — R262 Difficulty in walking, not elsewhere classified: Secondary | ICD-10-CM | POA: Diagnosis not present

## 2016-09-12 DIAGNOSIS — M545 Low back pain: Secondary | ICD-10-CM | POA: Diagnosis not present

## 2016-09-12 DIAGNOSIS — Z9889 Other specified postprocedural states: Secondary | ICD-10-CM | POA: Diagnosis not present

## 2016-10-31 DIAGNOSIS — I5032 Chronic diastolic (congestive) heart failure: Secondary | ICD-10-CM | POA: Diagnosis not present

## 2016-10-31 DIAGNOSIS — Z23 Encounter for immunization: Secondary | ICD-10-CM | POA: Diagnosis not present

## 2016-10-31 DIAGNOSIS — R7303 Prediabetes: Secondary | ICD-10-CM | POA: Diagnosis not present

## 2016-10-31 DIAGNOSIS — R300 Dysuria: Secondary | ICD-10-CM | POA: Diagnosis not present

## 2016-10-31 DIAGNOSIS — R739 Hyperglycemia, unspecified: Secondary | ICD-10-CM | POA: Diagnosis not present

## 2016-10-31 DIAGNOSIS — J3089 Other allergic rhinitis: Secondary | ICD-10-CM | POA: Diagnosis not present

## 2016-10-31 DIAGNOSIS — I480 Paroxysmal atrial fibrillation: Secondary | ICD-10-CM | POA: Diagnosis not present

## 2016-10-31 DIAGNOSIS — I10 Essential (primary) hypertension: Secondary | ICD-10-CM | POA: Diagnosis not present

## 2016-11-04 DIAGNOSIS — N183 Chronic kidney disease, stage 3 (moderate): Secondary | ICD-10-CM | POA: Diagnosis not present

## 2016-11-04 DIAGNOSIS — I129 Hypertensive chronic kidney disease with stage 1 through stage 4 chronic kidney disease, or unspecified chronic kidney disease: Secondary | ICD-10-CM | POA: Diagnosis not present

## 2016-11-04 DIAGNOSIS — Z23 Encounter for immunization: Secondary | ICD-10-CM | POA: Diagnosis not present

## 2016-12-19 DIAGNOSIS — M25551 Pain in right hip: Secondary | ICD-10-CM | POA: Diagnosis not present

## 2016-12-19 DIAGNOSIS — M5441 Lumbago with sciatica, right side: Secondary | ICD-10-CM | POA: Diagnosis not present

## 2016-12-19 DIAGNOSIS — M47818 Spondylosis without myelopathy or radiculopathy, sacral and sacrococcygeal region: Secondary | ICD-10-CM | POA: Diagnosis not present

## 2016-12-19 DIAGNOSIS — M545 Low back pain: Secondary | ICD-10-CM | POA: Diagnosis not present

## 2016-12-31 DIAGNOSIS — I480 Paroxysmal atrial fibrillation: Secondary | ICD-10-CM | POA: Diagnosis not present

## 2016-12-31 DIAGNOSIS — N183 Chronic kidney disease, stage 3 (moderate): Secondary | ICD-10-CM | POA: Diagnosis not present

## 2016-12-31 DIAGNOSIS — Z7901 Long term (current) use of anticoagulants: Secondary | ICD-10-CM | POA: Diagnosis not present

## 2016-12-31 DIAGNOSIS — I10 Essential (primary) hypertension: Secondary | ICD-10-CM | POA: Diagnosis not present

## 2016-12-31 DIAGNOSIS — I5032 Chronic diastolic (congestive) heart failure: Secondary | ICD-10-CM | POA: Diagnosis not present

## 2017-01-02 DIAGNOSIS — L03119 Cellulitis of unspecified part of limb: Secondary | ICD-10-CM | POA: Diagnosis not present

## 2017-01-02 DIAGNOSIS — R233 Spontaneous ecchymoses: Secondary | ICD-10-CM | POA: Diagnosis not present

## 2017-01-07 DIAGNOSIS — Z7901 Long term (current) use of anticoagulants: Secondary | ICD-10-CM | POA: Diagnosis not present

## 2017-01-07 DIAGNOSIS — I5032 Chronic diastolic (congestive) heart failure: Secondary | ICD-10-CM | POA: Diagnosis not present

## 2017-01-07 DIAGNOSIS — I480 Paroxysmal atrial fibrillation: Secondary | ICD-10-CM | POA: Diagnosis not present

## 2017-01-07 DIAGNOSIS — I10 Essential (primary) hypertension: Secondary | ICD-10-CM | POA: Diagnosis not present

## 2017-01-07 DIAGNOSIS — I130 Hypertensive heart and chronic kidney disease with heart failure and stage 1 through stage 4 chronic kidney disease, or unspecified chronic kidney disease: Secondary | ICD-10-CM | POA: Diagnosis not present

## 2017-01-07 DIAGNOSIS — N183 Chronic kidney disease, stage 3 (moderate): Secondary | ICD-10-CM | POA: Diagnosis not present

## 2017-01-23 DIAGNOSIS — Z7901 Long term (current) use of anticoagulants: Secondary | ICD-10-CM | POA: Diagnosis not present

## 2017-01-23 DIAGNOSIS — I5032 Chronic diastolic (congestive) heart failure: Secondary | ICD-10-CM | POA: Diagnosis not present

## 2017-01-23 DIAGNOSIS — F5101 Primary insomnia: Secondary | ICD-10-CM | POA: Diagnosis not present

## 2017-05-05 ENCOUNTER — Other Ambulatory Visit: Payer: Self-pay

## 2017-05-05 ENCOUNTER — Ambulatory Visit (HOSPITAL_COMMUNITY)
Admission: EM | Admit: 2017-05-05 | Discharge: 2017-05-05 | Disposition: A | Discharge status: Home / Self Care | Payer: Medicare Other | Attending: Family Medicine | Admitting: Family Medicine

## 2017-05-05 DIAGNOSIS — M5431 Sciatica, right side: Secondary | ICD-10-CM | POA: Diagnosis not present

## 2017-05-05 MED ORDER — PREDNISONE 50 MG PO TABS: 50.0000 mg | ORAL_TABLET | Freq: Every day | ORAL | 0 refills | Status: AC

## 2017-05-05 NOTE — Discharge Instructions (Signed)
Please begin prednisone daily until Wednesday. Follow up with Dr.Graves Thursday.  Continue Tylenol as needed also.

## 2017-05-05 NOTE — ED Provider Notes (Signed)
[place]    CSN: [id number] Arrival date & time: 05/05/17  1139     History   Chief Complaint Chief Complaint  Patient presents with  . Leg Pain    HPI [name] [name] is a 84 y.o. female history of chronic diastolic CHF, CKD, hypertension, paroxysmal A. fib presenting today with back pain.  States that she is having a flareup of her sciatica.  States that she will occasionally have pain radiating down into her right leg.  Pain radiates down into the anterior aspect of her leg.  Denies any numbness or tingling.  Previously seeing Dr. Berenice Primas with orthopedics for intra-articular hip injections of cortisone for this.  She has an appointment on Thursday to follow-up in hopes to get another injection.  Patient cannot take narcotics that she is on Cardizem and it drops her heart rate too low.  Patient is also on Eliquis and cannot take NSAIDs.  HPI  Past Medical History:  Diagnosis Date  . Allergy   . Anxiety    mild  . Arthritis   . Carotid artery stenosis    underwent right carotid endarterectomy with bovine pericardial patch angioplasty for an 80% lesion and 40-59% left carotid stenosis  . Chronic diastolic CHF (congestive heart failure), NYHA class 1 ([place])   . CKD (chronic kidney disease), stage III   . GERD (gastroesophageal reflux disease)   . Gout   . Hyperlipidemia    she is statin intolerant  . Hypertension   . Obesity   . Paroxysmal atrial fibrillation (HCC)    s/p TEE/DCCV  . Pulmonary HTN    mild   . Type II or unspecified type diabetes mellitus without mention of complication, not stated as uncontrolled   . Vitamin D deficiency     Patient Active Problem List   Diagnosis Date Noted  . Mild diastolic dysfunction 92/12/9415  . Encounter for Medicare annual wellness exam 08/23/2014  . At moderate risk for fall 08/23/2014  . Medication management 06/08/2014  . PAF (paroxysmal atrial fibrillation) ([place]) 06/02/2014  . T2_NIDDM w/CKD 02/23/2014  .  Carotid artery stenosis   . Chronic diastolic CHF (congestive heart failure), NYHA class 1 ([place])   . Essential hypertension 02/01/2013  . Mixed hyperlipidemia 02/01/2013  . GERD (gastroesophageal reflux disease) 02/01/2013  . Gout   . Vitamin D deficiency     Past Surgical History:  Procedure Laterality Date  . CARDIOVERSION N/A 06/17/2014   Procedure: CARDIOVERSION;  Surgeon: Fay Records, MD;  Location: [facility];  Service: Cardiovascular;  Laterality: N/A;  . CATARACT EXTRACTION    . dilitation and currage    . ENDARTERECTOMY  08/17/2014  . ENDARTERECTOMY Right 08/18/2014   Procedure: ENDARTERECTOMY CAROTID;  Surgeon: Serafina Mitchell, MD;  Location: [facility];  Service: Vascular;  Laterality: Right;  . JOINT REPLACEMENT    . PATCH ANGIOPLASTY Right 08/18/2014   Procedure: PATCH ANGIOPLASTY USING XENOSURE BIOLOGIC PATCH 1CM X 6CM;  Surgeon: Serafina Mitchell, MD;  Location: [facility];  Service: Vascular;  Laterality: Right;  . PERIPHERAL VASCULAR CATHETERIZATION N/A 08/17/2014   Procedure: Carotid Angiography;  Surgeon: Serafina Mitchell, MD;  Location: [facility];  Service: Cardiovascular;  Laterality: N/A;  . TEE WITHOUT CARDIOVERSION N/A 06/17/2014   Procedure: TRANSESOPHAGEAL ECHOCARDIOGRAM (TEE);  Surgeon: Fay Records, MD;  Location: [facility] ENDOSCOPY;  Service: Cardiovascular;  Laterality: N/A;  . VEIN SURGERY      OB History   None  Home Medications    Prior to Admission medications   Medication Sig Start Date End Date Taking? Authorizing Provider  amLODipine-benazepril (LOTREL) 5-10 MG capsule Take 1 capsule by mouth daily.   Yes [provider]  allopurinol (ZYLOPRIM) 300 MG tablet TAKE 1 TABLET BY MOUTH ONCE DAILY TO PREVENT GOUT Patient taking differently: TAKE 1/2 TABLET BY MOUTH ONCE DAILY TO PREVENT GOUT 01/13/15   Unk Pinto, MD  ALPRAZolam Duanne Moron) 1 MG tablet TAKE 1/2 OR 1 TABLET BY MOUTH AT BEDTIME AS NEEDED FOR SLEEP 05/08/15   Unk Pinto, MD    cholecalciferol (VITAMIN D) 1000 UNITS tablet Take 4,000 Units by mouth every morning.     [provider]  diltiazem (CARDIZEM CD) 240 MG 24 hr capsule TAKE 1 CAPSULE EVERY DAY 10/24/15   Turner, Eber Hong, MD  ELIQUIS 2.5 MG TABS tablet TAKE 1 TABLET BY MOUTH TWICE A DAY 02/23/16   Sueanne Margarita, MD  ezetimibe (ZETIA) 10 MG tablet Take 1 tablet (10 mg total) by mouth daily. 05/09/15   Turner, Eber Hong, MD  Flaxseed, Linseed, (FLAX SEED OIL) 1000 MG CAPS Take 1 capsule by mouth every morning.    [provider]  furosemide (LASIX) 40 MG tablet Take 20 mg by mouth daily. 06/13/14   [provider]  gabapentin (NEURONTIN) 300 MG capsule TAKE ONE CAPSULE BY MOUTH EVERY DAY AT BEDTIME 06/03/15   Unk Pinto, MD  gemfibrozil (LOPID) 600 MG tablet TAKE 1 TABLET BY MOUTH TWICE A DAY WITH FOOD FOR CHOLESTEROL 01/06/15   Unk Pinto, MD  glucose blood (FREESTYLE LITE) test strip USE ONCE A DAY OR AS DIRECTED FOR FLUCTUATING BLOOD SUGARS 07/27/15   Unk Pinto, MD  Lancets (FREESTYLE) lancets CHECK BLOOD GLUCOSE 1 TIME DAILY. VP-X10.62 07/27/15   Unk Pinto, MD  losartan (COZAAR) 25 MG tablet Take 1 tablet (25 mg total) by mouth daily. 12/19/14   Sueanne Margarita, MD  magnesium oxide (MAG-OX) 400 MG tablet Take 400 mg by mouth daily.    [provider]  metoprolol (LOPRESSOR) 50 MG tablet TAKE 1 TABLET (50 MG TOTAL) BY MOUTH 2 (TWO) TIMES DAILY. 12/22/15   Sueanne Margarita, MD  omeprazole (PRILOSEC) 20 MG capsule TAKE ONE CAPSULE EVERY MORNING FOR ACID 06/11/15   Unk Pinto, MD  predniSONE (DELTASONE) 50 MG tablet Take 1 tablet (50 mg total) by mouth daily for 3 days. 05/05/17 05/08/17  Wieters, Hallie C, PA-C  ranitidine (ZANTAC) 300 MG tablet Take 1 tablet (300 mg total) by mouth as needed for heartburn. 06/14/14   Sueanne Margarita, MD  vitamin B-12 (CYANOCOBALAMIN) 1000 MCG tablet Take 1,000 mcg by mouth every morning.    [provider]  vitamin C  (ASCORBIC ACID) 500 MG tablet Take 500 mg by mouth 2 (two) times daily.     [provider]    Family History Family History  Problem Relation Age of Onset  . Hypertension Mother   . Stroke Mother   . Asthma Father   . Hypertension Father   . Hyperlipidemia Father   . Heart attack Father   . Heart disease Father        before age 52  . Diabetes Brother   . Heart disease Brother        before age 16  . Sleep apnea Daughter   . Asthma Son   . Hypertension Son     Social History Social History   Tobacco Use  . Smoking  status: Never Smoker  . Smokeless tobacco: Never Used  Substance Use Topics  . Alcohol use: No  . Drug use: No     Allergies   Codeine; Lisinopril; and Tylenol pm extra [diphenhydramine-apap (sleep)]   Review of Systems Review of Systems  Constitutional: Positive for fatigue. Negative for fever.  Eyes: Negative for visual disturbance.  Respiratory: Negative for shortness of breath.   Cardiovascular: Negative for chest pain.  Gastrointestinal: Negative for nausea and vomiting.  Genitourinary: Negative for difficulty urinating.  Musculoskeletal: Positive for arthralgias, back pain, gait problem and myalgias. Negative for neck pain and neck stiffness.  Skin: Negative for rash and wound.  Neurological: Negative for dizziness, speech difficulty, weakness, light-headedness, numbness and headaches.     Physical Exam Triage Vital Signs ED Triage Vitals  Enc Vitals Group     BP 05/05/17 1215 (!) 135/48     Pulse Rate 05/05/17 1215 62     Resp --      Temp 05/05/17 1215 97.8 F (36.6 C)     Temp Source 05/05/17 1215 Oral     SpO2 05/05/17 1215 98 %     Weight --      Height --      Head Circumference --      Peak Flow --      Pain Score 05/05/17 1212 8     Pain Loc --      Pain Edu? --      Excl. in [place]? --    No data found.  Updated Vital Signs BP (!) 135/48 (BP Location: Left Arm)   Pulse 62   Temp 97.8 F (36.6 C) (Oral)    SpO2 98%   Visual Acuity Right Eye Distance:   Left Eye Distance:   Bilateral Distance:    Right Eye Near:   Left Eye Near:    Bilateral Near:     Physical Exam  Constitutional: She is oriented to person, place, and time. She appears well-developed and well-nourished. No distress.  HENT:  Head: Normocephalic and atraumatic.  Eyes: Conjunctivae are normal.  Neck: Neck supple.  Cardiovascular: Normal rate and regular rhythm.  No murmur heard. Pulmonary/Chest: Effort normal. No respiratory distress.  Musculoskeletal: She exhibits no edema.  Well-healed surgical scars in lumbar region of spine, nontender to palpation of midline thoracic or lumbar spine.  Tenderness to palpation of lateral musculature; straight leg raise not performed due to patient's mobility and pain.  Neurological: She is alert and oriented to person, place, and time. No cranial nerve deficit.  Skin: Skin is warm and dry.  Psychiatric: She has a normal mood and affect.  Nursing note and vitals reviewed.    UC Treatments / Results  Labs (all labs ordered are listed, but only abnormal results are displayed) Labs Reviewed - No data to display  EKG None Radiology No results found.  Procedures Procedures (including critical care time)  Medications Ordered in UC Medications - No data to display   Initial Impression / Assessment and Plan / UC Course  I have reviewed the triage vital signs and the nursing notes.  Pertinent labs & imaging results that were available during my care of the patient were reviewed by me and considered in my medical decision making (see chart for details).     Patient with back pain with radicular distribution on right side.  Avoiding narcotics and NSAIDs, will provide 3 days of oral prednisone versus IM to ensure patient will still be allowed to  have intra-articular injection on Thursday. Discussed strict return precautions. Patient verbalized understanding and is agreeable with  plan.   Final Clinical Impressions(s) / UC Diagnoses   Final diagnoses:  Sciatica of right side    ED Discharge Orders        Ordered    predniSONE (DELTASONE) 50 MG tablet  Daily     05/05/17 1238       Controlled Substance Prescriptions [place] Controlled Substance Registry consulted? Not Applicable   Janith Lima, [place] 05/05/17 1246

## 2017-05-05 NOTE — ED Triage Notes (Signed)
States having a flare-up right sciatic pain

## 2021-08-04 DEATH — deceased
